# Patient Record
Sex: Male | Born: 1945 | Race: White | Hispanic: No | Marital: Married | Smoking: Former smoker
Health system: Southern US, Community
[De-identification: ages and names within clinical notes are randomized; demographics above are authoritative.]

## PROBLEM LIST (undated history)

## (undated) DIAGNOSIS — IMO0001 Reserved for inherently not codable concepts without codable children: Secondary | ICD-10-CM

## (undated) DIAGNOSIS — C4491 Basal cell carcinoma of skin, unspecified: Secondary | ICD-10-CM

## (undated) DIAGNOSIS — I2510 Atherosclerotic heart disease of native coronary artery without angina pectoris: Secondary | ICD-10-CM

## (undated) DIAGNOSIS — I82409 Acute embolism and thrombosis of unspecified deep veins of unspecified lower extremity: Secondary | ICD-10-CM

## (undated) DIAGNOSIS — I5189 Other ill-defined heart diseases: Secondary | ICD-10-CM

## (undated) DIAGNOSIS — Z9289 Personal history of other medical treatment: Secondary | ICD-10-CM

## (undated) DIAGNOSIS — E669 Obesity, unspecified: Secondary | ICD-10-CM

## (undated) DIAGNOSIS — T8859XA Other complications of anesthesia, initial encounter: Secondary | ICD-10-CM

## (undated) DIAGNOSIS — R001 Bradycardia, unspecified: Secondary | ICD-10-CM

## (undated) DIAGNOSIS — M1990 Unspecified osteoarthritis, unspecified site: Secondary | ICD-10-CM

## (undated) DIAGNOSIS — I2699 Other pulmonary embolism without acute cor pulmonale: Secondary | ICD-10-CM

## (undated) DIAGNOSIS — R55 Syncope and collapse: Secondary | ICD-10-CM

## (undated) DIAGNOSIS — Z8739 Personal history of other diseases of the musculoskeletal system and connective tissue: Secondary | ICD-10-CM

## (undated) DIAGNOSIS — N400 Enlarged prostate without lower urinary tract symptoms: Secondary | ICD-10-CM

## (undated) DIAGNOSIS — N529 Male erectile dysfunction, unspecified: Secondary | ICD-10-CM

## (undated) DIAGNOSIS — E7800 Pure hypercholesterolemia, unspecified: Secondary | ICD-10-CM

## (undated) HISTORY — PX: SKIN CANCER EXCISION: SHX779

## (undated) HISTORY — PX: LOOP RECORDER IMPLANT: SHX5954

## (undated) HISTORY — PX: JOINT REPLACEMENT: SHX530

## (undated) HISTORY — DX: Male erectile dysfunction, unspecified: N52.9

## (undated) HISTORY — DX: Reserved for inherently not codable concepts without codable children: IMO0001

## (undated) HISTORY — PX: MOHS SURGERY: SUR867

## (undated) HISTORY — DX: Other ill-defined heart diseases: I51.89

## (undated) HISTORY — DX: Obesity, unspecified: E66.9

## (undated) HISTORY — DX: Personal history of other medical treatment: Z92.89

## (undated) HISTORY — DX: Other pulmonary embolism without acute cor pulmonale: I26.99

## (undated) HISTORY — DX: Basal cell carcinoma of skin, unspecified: C44.91

## (undated) HISTORY — DX: Acute embolism and thrombosis of unspecified deep veins of unspecified lower extremity: I82.409

## (undated) HISTORY — DX: Syncope and collapse: R55

## (undated) HISTORY — DX: Pure hypercholesterolemia, unspecified: E78.00

## (undated) HISTORY — DX: Benign prostatic hyperplasia without lower urinary tract symptoms: N40.0

## (undated) HISTORY — DX: Bradycardia, unspecified: R00.1

---

## 1977-08-15 DIAGNOSIS — I82409 Acute embolism and thrombosis of unspecified deep veins of unspecified lower extremity: Secondary | ICD-10-CM

## 1977-08-15 DIAGNOSIS — I2699 Other pulmonary embolism without acute cor pulmonale: Secondary | ICD-10-CM

## 1977-08-15 HISTORY — DX: Acute embolism and thrombosis of unspecified deep veins of unspecified lower extremity: I82.409

## 1977-08-15 HISTORY — DX: Other pulmonary embolism without acute cor pulmonale: I26.99

## 1997-11-16 ENCOUNTER — Emergency Department (HOSPITAL_COMMUNITY): Admission: EM | Admit: 1997-11-16 | Discharge: 1997-11-16 | Payer: Self-pay | Admitting: Emergency Medicine

## 2004-12-03 ENCOUNTER — Observation Stay (HOSPITAL_COMMUNITY): Admission: EM | Admit: 2004-12-03 | Discharge: 2004-12-04 | Payer: Self-pay | Admitting: *Deleted

## 2007-01-23 ENCOUNTER — Ambulatory Visit: Payer: Self-pay | Admitting: Internal Medicine

## 2007-01-23 DIAGNOSIS — Z86718 Personal history of other venous thrombosis and embolism: Secondary | ICD-10-CM

## 2007-01-23 DIAGNOSIS — M79609 Pain in unspecified limb: Secondary | ICD-10-CM | POA: Insufficient documentation

## 2007-01-23 DIAGNOSIS — M549 Dorsalgia, unspecified: Secondary | ICD-10-CM | POA: Insufficient documentation

## 2007-01-23 DIAGNOSIS — Z85828 Personal history of other malignant neoplasm of skin: Secondary | ICD-10-CM

## 2007-01-24 ENCOUNTER — Encounter: Payer: Self-pay | Admitting: Internal Medicine

## 2007-01-30 ENCOUNTER — Encounter: Admission: RE | Admit: 2007-01-30 | Discharge: 2007-01-30 | Payer: Self-pay | Admitting: Internal Medicine

## 2008-03-28 ENCOUNTER — Emergency Department (HOSPITAL_BASED_OUTPATIENT_CLINIC_OR_DEPARTMENT_OTHER): Admission: EM | Admit: 2008-03-28 | Discharge: 2008-03-28 | Payer: Self-pay | Admitting: Emergency Medicine

## 2008-09-15 ENCOUNTER — Encounter: Admission: RE | Admit: 2008-09-15 | Discharge: 2008-09-15 | Payer: Self-pay | Admitting: General Surgery

## 2009-02-20 ENCOUNTER — Ambulatory Visit: Payer: Self-pay | Admitting: Family Medicine

## 2009-02-20 LAB — CONVERTED CEMR LAB
Basophils Absolute: 0 10*3/uL (ref 0.0–0.1)
Eosinophils Relative: 2.6 % (ref 0.0–5.0)
HCT: 42.8 % (ref 39.0–52.0)
Hemoglobin: 15.3 g/dL (ref 13.0–17.0)
Lymphs Abs: 1.6 10*3/uL (ref 0.7–4.0)
Monocytes Relative: 12.2 % — ABNORMAL HIGH (ref 3.0–12.0)
Neutro Abs: 4.7 10*3/uL (ref 1.4–7.7)
Neutrophils Relative %: 63 % (ref 43.0–77.0)
Platelets: 154 10*3/uL (ref 150.0–400.0)
RBC: 4.81 M/uL (ref 4.22–5.81)

## 2009-12-13 DIAGNOSIS — IMO0001 Reserved for inherently not codable concepts without codable children: Secondary | ICD-10-CM

## 2009-12-13 HISTORY — PX: CARDIOVASCULAR STRESS TEST: SHX262

## 2009-12-13 HISTORY — DX: Reserved for inherently not codable concepts without codable children: IMO0001

## 2010-01-12 ENCOUNTER — Encounter: Payer: Self-pay | Admitting: Internal Medicine

## 2010-01-13 ENCOUNTER — Encounter (INDEPENDENT_AMBULATORY_CARE_PROVIDER_SITE_OTHER): Payer: Self-pay | Admitting: Specialist

## 2010-01-13 ENCOUNTER — Ambulatory Visit: Payer: Self-pay | Admitting: Vascular Surgery

## 2010-01-13 ENCOUNTER — Ambulatory Visit (HOSPITAL_COMMUNITY): Admission: RE | Admit: 2010-01-13 | Discharge: 2010-01-13 | Payer: Self-pay | Admitting: Specialist

## 2010-01-19 ENCOUNTER — Encounter: Payer: Self-pay | Admitting: Cardiology

## 2010-03-04 ENCOUNTER — Ambulatory Visit: Payer: Self-pay | Admitting: Vascular Surgery

## 2010-06-25 ENCOUNTER — Ambulatory Visit: Payer: Self-pay | Admitting: Cardiology

## 2010-09-14 NOTE — Letter (Signed)
Summary: [facility]  [facility]   Imported By: Maryln Gottron 01/27/2010 15:27:22  _____________________________________________________________________  External Attachment:    Type:   Image     Comment:   External Document

## 2010-09-14 NOTE — Letter (Signed)
Summary: [facility] Office Visit Note   [facility] Office Visit Note   Imported By: Roderic Ovens 03/03/2010 11:57:40  _____________________________________________________________________  External Attachment:    Type:   Image     Comment:   External Document

## 2010-12-28 NOTE — Consult Note (Signed)
VASCULAR SURGERY CONSULTATION   Joe Davis, Joe Davis  DOB:  [birth year]                                       03/04/2010  VWUJW#:[id number]   I saw the patient in the office today in consultation concerning his  right leg swelling.  He was referred by Dr. Shelle Iron.  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  He states he was on  Coumadin for a couple of years but then this was discontinued.  He has  had no new clots since that time that he is aware of.  He had been  having increasing problems with swelling in his right leg and was  evaluated by Dr. Shelle Iron.  He did undergo venous duplex scan on 01/13/2010  which showed no evidence of DVT.  The swelling had come on gradually and  was exacerbated by standing and relieved with elevation.  There were no  other aggravating or alleviating factors.  He had really no significant  pain associated with the swelling.  He denies any history of  claudication, rest pain or nonhealing ulcers.   His past medical history is fairly unremarkable otherwise.  He denies  any history of diabetes, hypertension, hypercholesterolemia, history of  previous myocardial infarction, history of congestive heart failure or  history of COPD.   His family history is noncontributory.  He is adopted and does not know  his family history.   SOCIAL HISTORY:  He is single.  He quit tobacco in 1979.  He drinks a  moderate amount of alcohol.   REVIEW OF SYSTEMS:  GENERAL:  He has had no recent weight loss, weight  gain or problems with his appetite.  CARDIOVASCULAR:  He has had no chest pain, chest pressure, palpitations  or arrhythmias.  He has had no history of stroke, TIAs or amaurosis  fugax.  He has had no claudication or rest pain.  GI, neurologic, pulmonary, hematologic, urinary, ENT, musculoskeletal,  psychiatric and integumentary review of systems is  unremarkable and is  documented on the medical history form in his chart.   PHYSICAL EXAMINATION:  General:  This is a pleasant 66 year old  gentleman who appears his stated age.  Vital signs:  Blood pressure is  118/73, heart rate is 61, saturation 98%.  HEENT:  Unremarkable.  Lungs:  Are clear bilaterally to auscultation without rales, rhonchi or  wheezing.  Cardiovascular:  I do not detect any carotid bruits.  He has  a regular rate and rhythm.  He has palpable femoral, popliteal and pedal  pulses bilaterally.  He has moderate right lower extremity swelling with  minimal left lower extremity swelling.  Abdomen:  Soft and nontender  with normal pitched bowel sounds.  No masses appreciated.  Musculoskeletal:  He has no major deformities or cyanosis.  Extremities:  He does have some telangiectasias and spider veins bilaterally.  Neurological:  He has no focal weakness or paresthesias.  Lymphatic:  He  has no significant cervical, axillary or inguinal lymphadenopathy.   I have reviewed his venous duplex scan from 01/13/2010.  This  demonstrated compressible vessels throughout from the right common  femoral vein down into the tibial veins.  Venous flow is noted to be  phasic and spontaneous.  There was no evidence  of chronic DVT present.  The patient has also undergone a workup for some back pain.  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 Shelle Iron with this also.   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  There is no real history to suggest  any reason for him to have significant lymphedema.  We have discussed  the importance of intermittent leg elevation and the proper positioning  for this.  He does have a compression stocking with a gradient  of 20 -  30 mmHg which appears to be an appropriate strength for him.  He has  been wearing this and this has helped his swelling significantly.  I  have explained that this typically is a chronic problem and that he will  need to continue use elevation and compression therapy to manage his  swelling.  Will be happy to see him back at any time if any new vascular  issues arise.     Di Kindle. Edilia Bo, M.D.  Electronically Signed  CSD/MEDQ  D:  03/04/2010  T:  03/05/2010  Job:  [id number]   cc:   Jene Every, M.D.

## 2010-12-31 NOTE — H&P (Signed)
NAME:  Joe Davis, Joe Davis NO.:  [id number]   MEDICAL RECORD NO.:  [id number]          PATIENT TYPE:  INP   LOCATION:  1827                         FACILITY:  [facility]   PHYSICIAN:  Vesta Mixer, M.D. DATE OF BIRTH:  [birth year]   DATE OF ADMISSION:  12/03/2004  DATE OF DISCHARGE:                                HISTORY & PHYSICAL   HISTORY OF PRESENT ILLNESS:  Joe Davis is a 65 year old gentleman with a  history of hyperlipidemia and basal cell carcinoma.  He is admitted for 23-  hour observation after having an episode of bradycardia and near syncope.   The patient has had a chronically low blood pressure for years.  It has  typically been in the 40 to 50 range according to the office notes.  He has  been completely asymptomatic.  He had a stress Cardiolite study  approximately 5 years ago which was negative for ischemia.   Yesterday, he had surgery at [facility] to remove a basal cell carcinoma  from the right side of his nose.  This morning, his wife was changing his  dressing.  He became very anxious and nervous.  He developed cold sweats and  lightheadedness.  EMS was called and they found him to be quite bradycardic.  On his way to the emergency room, he felt a lot better.  His heart rate came  up to the 50 range without any intervention.  He feels a little bit better  now, but is still weak and somewhat anxious about going home.   CURRENT MEDICATIONS:  1.  Lipitor 20 mg a day.  2.  Tylenol No.3 as needed.  3.  Keflex 500 mg twice a day.  4.  Enteric-coated aspirin 81 mg a day.   ALLERGIES:  No known drug allergies.   PAST MEDICAL HISTORY:  1.  Hyperlipidemia.  2.  Basal cell carcinoma.   SOCIAL HISTORY:  The patient does not smoke.  He drinks regularly.  His  family history is noncontributory.   REVIEW OF SYSTEMS:  Reviewed and is essentially negative.   PHYSICAL EXAMINATION:  GENERAL:  He is a middle-aged gentleman.  He is  comfortable, but  does appear anxious.  VITAL SIGNS:  Heart rate is 55, blood pressure 130/80.  HEENT:  Bandage on the right side of his nose.  Has no JVD.  LUNGS:  Clear.  HEART:  Regular rate and rhythm, S1 and S2, and somewhat bradycardic.  ABDOMEN:  Good bowel sounds and is nontender.  EXTREMITIES:  He has no cyanosis, clubbing, or edema.  NEUROLOGY:  Nonfocal.   Telemetry; strips from EMS reveal sinus bradycardia.  His EKG reveals sinus  bradycardia.   LABORATORY DATA:  His electrolytes are within normal limits.  His CPK-MB is  negative.   IMPRESSION:  1.  Bradycardia.  The most likely cause of this is a vagal reaction from the      dressing change.  His heart rate is always slow and this is not unusual      for someone to have a vagal reaction with a dressing  change such as      this.  He is completely asymptomatic and I do not think that this is an      acute cardiac emergency.  He probably will need a pacemaker some day,      but I do not think that he will necessarily need it soon.  I would like      to admit him for 23 hours for observation to make sure that this does      not happen again.  We will anticipate discharge tomorrow.  2.  All other medical problems remain stable.      PJN/MEDQ  D:  12/03/2004  T:  12/03/2004  Job:  [id number]   cc:   Cassell Clement, M.D.  [address], [address]  [place]  [place] [address]  Fax: [phone]

## 2011-01-07 ENCOUNTER — Telehealth: Payer: Self-pay | Admitting: Cardiology

## 2011-01-07 NOTE — Telephone Encounter (Signed)
Left message for patient to call back to schedule.  °

## 2011-01-07 NOTE — Telephone Encounter (Signed)
Patient needs June appt.  Can't schedule, no openings.  Please call

## 2011-01-24 ENCOUNTER — Encounter: Payer: Self-pay | Admitting: Cardiology

## 2011-01-26 ENCOUNTER — Encounter: Payer: Self-pay | Admitting: Cardiology

## 2011-01-26 ENCOUNTER — Ambulatory Visit (INDEPENDENT_AMBULATORY_CARE_PROVIDER_SITE_OTHER): Payer: Medicare Other | Admitting: Cardiology

## 2011-01-26 VITALS — BP 124/70 | HR 64 | Wt 231.0 lb

## 2011-01-26 DIAGNOSIS — E7800 Pure hypercholesterolemia, unspecified: Secondary | ICD-10-CM

## 2011-01-26 DIAGNOSIS — M549 Dorsalgia, unspecified: Secondary | ICD-10-CM

## 2011-01-26 DIAGNOSIS — E785 Hyperlipidemia, unspecified: Secondary | ICD-10-CM

## 2011-01-26 NOTE — Progress Notes (Signed)
Joe Davis Date of Birth:  [birth year] [facility] [address]   [address] [place], [place]  [address] [phone]           Fax   [phone]  HPI: This pleasant 65 year old gentleman is seen for a six-month followup office visit.  He has a history of hypercholesterolemia.  He does not have any history of ischemic heart disease.  The patient had a LexiScan Cardiolite stress test on 01/04/10 which showed an ejection fraction of 73% and no evidence of myocardial patient has a past history of mild cardiomegaly and a past history of deep vein thrombosis.  He had an echocardiogram on 01/28/8 which showed normal left ventricular systolic function and did show diastolic dysfunction as well as mild aortic sclerosis and mild pulmonary hypertension at 40.  The patient has not been expressing any chest pain.  Current Outpatient Prescriptions  Medication Sig Dispense Refill  . Ascorbic Acid (VITAMIN C PO) Take by mouth daily.        Marland Kitchen aspirin 81 MG tablet Take 81 mg by mouth daily. Taking occ.      . fish oil-omega-3 fatty acids 1000 MG capsule Take by mouth daily.        Marland Kitchen NIACIN PO Take by mouth daily.          Allergies  Allergen Reactions  . Crestor (Rosuvastatin Calcium)     LEG PAIN  . Lipitor (Atorvastatin Calcium)     LEG PAIN    Patient Active Problem List  Diagnoses  . BACK PAIN  . Pain in Soft Tissues of Limb  . SKIN CANCER, HX OF  . Personal History of Venous Thrombosis and Embolism  . Hypercholesterolemia    History  Smoking status  . Former Smoker  . Quit date: 08/15/1977  Smokeless tobacco  . Not on file    History  Alcohol Use No    No family history on file.  Review of Systems: The patient denies any heat or cold intolerance.  No weight gain or weight loss.  The patient denies headaches or blurry vision.  There is no cough or sputum production.  The patient denies dizziness.  There is no hematuria or hematochezia.    The  patient denies any rash.  The patient denies frequent falling or instability.  There is no history of depression or anxiety.  All other systems were reviewed and are negative.   Physical Exam: Filed Vitals:   01/26/11 1059  BP: 124/70  Pulse: 64  The general appearance feels a well-developed well-nourished gentleman in no distress.The head and neck exam reveals pupils equal and reactive.  Extraocular movements are full.  There is no scleral icterus.  The mouth and pharynx are normal.  The neck is supple.  The carotids reveal no bruits.  The jugular venous pressure is normal.  The  thyroid is not enlarged.  There is no lymphadenopathy.  The chest is clear to percussion and auscultation.  There are no rales or rhonchi.  Expansion of the chest is symmetrical.  The precordium is quiet.  The first heart sound is normal.  The second heart sound is physiologically split.  There is no murmur gallop rub or click.  There is no abnormal lift or heave.  The abdomen is soft and nontender.  The bowel sounds are normal.  The liver and spleen are not enlarged.  There are no abdominal masses.  There are no abdominal bruits.  Extremities reveal  good pedal pulses.  There is no phlebitis or edema.  There is no cyanosis or clubbing.  Strength is normal and symmetrical in all extremities.  There is no lateralizing weakness.  There are no sensory deficits.  The skin is warm and dry.  There is no rash.    Assessment / Plan:  The patient is to return later this week for fasting lab work he will contact his neurosurgeon friend at [facility] regarding his back pain with sciatic radiation of pain into his right leg.  Recheck in 6 months for followup office visit and fasting lab

## 2011-01-26 NOTE — Assessment & Plan Note (Signed)
The patient is seen after a long absence.  He has a past history of hypercholesterolemia.  He is not presently taking any statin therapy.  In the past Lipitor and Crestor caused worsening of his chronic leg pain.  The patient has not been expressing any chest pain or increased shortness of breath or symptoms of angina pectoris.  The patient did not come fasting today so we will have him return on June 15 fasting for lipid panel hepatic function panel and basal metabolic panel.

## 2011-01-26 NOTE — Assessment & Plan Note (Signed)
The patient is still having a lot of problems with pain in his leg.  His orthopedist gave him an Injection in the right hip which did not help.  The patient had an MRI of his back about 4 years ago which did demonstrate some bulging discs.  The patient has a friend who is a Midwife at [facility] whom he will consult regarding his back symptoms.

## 2011-01-28 ENCOUNTER — Other Ambulatory Visit: Payer: Self-pay | Admitting: Cardiology

## 2011-01-28 ENCOUNTER — Other Ambulatory Visit (INDEPENDENT_AMBULATORY_CARE_PROVIDER_SITE_OTHER): Payer: Medicare Other | Admitting: *Deleted

## 2011-01-28 DIAGNOSIS — E785 Hyperlipidemia, unspecified: Secondary | ICD-10-CM

## 2011-01-28 LAB — HEPATIC FUNCTION PANEL
ALT: 30 U/L (ref 0–53)
AST: 33 U/L (ref 0–37)
Albumin: 4.1 g/dL (ref 3.5–5.2)
Alkaline Phosphatase: 56 U/L (ref 39–117)
Total Protein: 6.8 g/dL (ref 6.0–8.3)

## 2011-01-28 LAB — BASIC METABOLIC PANEL
GFR: 119.52 mL/min (ref >60.00)
Potassium: 4.3 mEq/L (ref 3.5–5.1)
Sodium: 140 mEq/L (ref 135–145)

## 2011-01-31 ENCOUNTER — Telehealth: Payer: Self-pay | Admitting: *Deleted

## 2011-01-31 NOTE — Telephone Encounter (Signed)
Mailed copy of labs 

## 2011-02-24 ENCOUNTER — Telehealth: Payer: Self-pay | Admitting: Cardiology

## 2011-02-24 DIAGNOSIS — M109 Gout, unspecified: Secondary | ICD-10-CM

## 2011-02-24 MED ORDER — ALLOPURINOL 300 MG PO TABS: 300.0000 mg | ORAL_TABLET | Freq: Every day | ORAL | Status: DC

## 2011-02-24 NOTE — Telephone Encounter (Signed)
Please advise 

## 2011-02-24 NOTE — Telephone Encounter (Signed)
Renal function normal, ordered allopurinol to return for lab work in 2-3wks, pt aware.Alfonso Ramus RN

## 2011-02-24 NOTE — Telephone Encounter (Signed)
Patient would like a prescription called into [facility] for his gout. Pls.call him back if you need to talk to him.

## 2011-02-25 ENCOUNTER — Telehealth: Payer: Self-pay | Admitting: Cardiology

## 2011-02-25 NOTE — Telephone Encounter (Signed)
Called patient back and he thinks he needs a combination of medications.  Please advise

## 2011-02-25 NOTE — Telephone Encounter (Signed)
Called because his pharmacist said that he is one prescription short of controlling acute gout. Needed a prescription filled for allopurinol 300mg  filled at [facility] in [place] [phone]. Please call back. I have pulled his chart.

## 2011-02-28 NOTE — Telephone Encounter (Signed)
Take allopurinol every day.  We will add Colcrys 0.6 mg take 2 pillsat onset of acute attack and one pill one hour later.  No more than 3 pills in 24 hours.  #15

## 2011-02-28 NOTE — Telephone Encounter (Signed)
Patient stated he went to [facility] and was given a prescription for this already.

## 2011-03-17 ENCOUNTER — Other Ambulatory Visit (INDEPENDENT_AMBULATORY_CARE_PROVIDER_SITE_OTHER): Payer: Medicare Other | Admitting: *Deleted

## 2011-03-17 DIAGNOSIS — M109 Gout, unspecified: Secondary | ICD-10-CM

## 2011-03-17 LAB — HEPATIC FUNCTION PANEL
ALT: 24 U/L (ref 0–53)
AST: 30 U/L (ref 0–37)
Albumin: 4.2 g/dL (ref 3.5–5.2)
Alkaline Phosphatase: 53 U/L (ref 39–117)
Bilirubin, Direct: 0 mg/dL (ref 0.0–0.3)
Total Bilirubin: 0.8 mg/dL (ref 0.3–1.2)
Total Protein: 6.7 g/dL (ref 6.0–8.3)

## 2011-03-17 LAB — BASIC METABOLIC PANEL: GFR: 88.26 mL/min (ref >60.00)

## 2011-03-18 ENCOUNTER — Telehealth: Payer: Self-pay | Admitting: *Deleted

## 2011-03-18 NOTE — Telephone Encounter (Signed)
Advised of labs 

## 2011-03-25 ENCOUNTER — Telehealth: Payer: Self-pay | Admitting: *Deleted

## 2011-03-25 NOTE — Telephone Encounter (Signed)
FYI

## 2011-03-25 NOTE — Telephone Encounter (Signed)
Transfer of care letter request from Dr Cato Mulligan to Dr Caryl Never

## 2011-03-25 NOTE — Telephone Encounter (Signed)
ok 

## 2011-04-12 ENCOUNTER — Telehealth: Payer: Self-pay | Admitting: Cardiology

## 2011-04-12 NOTE — Telephone Encounter (Signed)
Forwarded to [employer]

## 2011-04-12 NOTE — Telephone Encounter (Signed)
Pt calling to see if had a MRI of back in his chart done in 2008.  Pt would like to have another MRI of the back.  Please call pt back to discuss.  Chart in box.

## 2011-05-02 ENCOUNTER — Telehealth: Payer: Self-pay | Admitting: Cardiology

## 2011-05-02 NOTE — Telephone Encounter (Signed)
PT CALLING HAVING IRREGULAR HEARTBEATS, FOR LAST TWO DAYS, WANTS APPT THIS WEEK, WILL COME AND SIT IF HE HAS TO

## 2011-05-02 NOTE — Telephone Encounter (Signed)
Scheduled appointment with Lawson Fiscal NP tomorrow

## 2011-05-03 ENCOUNTER — Encounter: Payer: Self-pay | Admitting: Nurse Practitioner

## 2011-05-03 ENCOUNTER — Ambulatory Visit (INDEPENDENT_AMBULATORY_CARE_PROVIDER_SITE_OTHER): Payer: Medicare Other | Admitting: Nurse Practitioner

## 2011-05-03 DIAGNOSIS — E7800 Pure hypercholesterolemia, unspecified: Secondary | ICD-10-CM

## 2011-05-03 DIAGNOSIS — R0789 Other chest pain: Secondary | ICD-10-CM | POA: Insufficient documentation

## 2011-05-03 DIAGNOSIS — R002 Palpitations: Secondary | ICD-10-CM | POA: Insufficient documentation

## 2011-05-03 NOTE — Assessment & Plan Note (Signed)
His symptoms do not seem to be cardiac given their nature of "jumping around" in his chest. He has had a negative stress test last May. He has no exertional symptoms and has good exercise tolerance. He will continue to monitor and will be in touch with [place] if he has any worsening or change in symptoms.

## 2011-05-03 NOTE — Patient Instructions (Signed)
We will place a monitor to look at your heart rhythm for the next 24 hours.  Reduction of caffeine and alcohol are encouraged to help with the "skips" Regular exercise, diet and weight loss are recommended

## 2011-05-03 NOTE — Progress Notes (Signed)
    Joe Davis Date of Birth: 29-Oct-1945   History of Present Illness: Joe Davis is seen today for a work in visit. He is seen for Dr. Patty Sermons. He is here because he states that his significant other detected an irregular pulse. He is asymptomatic. It happens in the evening. Last night seemed to be more pronounced. He has daily alcohol use and says he may have had too much to drink last night. He is not dizzy or lightheaded. He does use caffeine in the form of coffee and soda. He will have some atypical chest pain that move around the left and right side of his chest. Nothing exertional. He is swimming. He has no symptoms while swimming. He is trying to lose weight. He brings in labs from [facility] for review. Total cholesterol was 200 with LDL of 137. He says he cannot take statins but has been given some other type of medicine from [facility]. He does not know the name of the medicine, nor has he been taking it. He continues with chronic leg/back issues.   Current Outpatient Prescriptions on File Prior to Visit  Medication Sig Dispense Refill  . allopurinol (ZYLOPRIM) 300 MG tablet Take 1 tablet (300 mg total) by mouth daily.  30 tablet  0  . aspirin 81 MG tablet Take 81 mg by mouth daily. Taking occ.      . fish oil-omega-3 fatty acids 1000 MG capsule Take by mouth daily.          Allergies  Allergen Reactions  . Crestor (Rosuvastatin Calcium)     LEG PAIN  . Lipitor (Atorvastatin Calcium)     LEG PAIN    Past Medical History  Diagnosis Date  . Hypercholesterolemia   . DVT (deep venous thrombosis) 1979  . Pulmonary embolism   . Basal cell carcinoma   . Syncope and collapse   . Bradycardia     asymptomatic  . Elevated CK 2011  . BPH (benign prostatic hyperplasia)   . Normal nuclear stress test May 2011  . Erectile dysfunction     Past Surgical History  Procedure Date  . Cardiovascular stress test     EF 73%    History  Smoking status  . Former Smoker  . Quit date:  08/15/1977  Smokeless tobacco  . Not on file    History  Alcohol Use  . Yes    daily use    No family history on file.  Review of Systems: The review of systems is positive for chronic leg/back issues. No syncope. No chest pain with exertion. He is trying to lose weight and has lost 11 pounds since his last visit here in June.  All other systems were reviewed and are negative.  Physical Exam: BP 130/84  Pulse 51  Resp 20  Ht 5\' 10"  (1.778 m)  Wt 220 lb (99.791 kg)  BMI 31.57 kg/m2 Patient is somewhat anxious but in no acute distress. Skin is warm and dry. Color is normal.  HEENT is unremarkable. Normocephalic/atraumatic. PERRL. Sclera are nonicteric. Neck is supple. No masses. No JVD. Lungs are clear. Cardiac exam shows a regular rate and rhythm.No ectopics noted. Abdomen is obese but soft. Extremities are without edema. Gait and ROM are intact. No gross neurologic deficits noted.   LABORATORY DATA: EKG shows sinus bradycardia. It is normal.   Assessment / Plan:

## 2011-05-03 NOTE — Assessment & Plan Note (Signed)
He is really asymptomatic but would like to have this further defined. 24 hour holter will be placed when he returns from swimming later this morning. I have encouraged him to cut back on caffeine and alcohol but I did not get the feeling that he is motivated to make those changes. Patient is agreeable to this plan and will call if any problems develop in the interim.

## 2011-05-03 NOTE — Assessment & Plan Note (Signed)
Currently it is not clear what therapy he is taking. He will call back with the medicines prescribed at [facility] He is willing to take for the next 6 weeks and then recheck his labs. Risk factor modification was strongly encouraged, especially if he is deemed "statin intolerant".

## 2011-05-05 ENCOUNTER — Telehealth: Payer: Self-pay | Admitting: Internal Medicine

## 2011-05-05 NOTE — Telephone Encounter (Signed)
Pt had sent a formal letter req to switch pcp from Dr Cato Mulligan to Dr Caryl Never. Pt has not gotten a response and is faxing the letter again. Pt is having severe epigastric pain and would like to make ov to est with Dr Caryl Never asap. Pls advise.

## 2011-05-05 NOTE — Telephone Encounter (Signed)
Ok with me 

## 2011-05-06 NOTE — Telephone Encounter (Signed)
Called and lft vm for pt that both doctors have agreed to change of pcp and to cb an sch ov with Dr Caryl Never.

## 2011-05-06 NOTE — Telephone Encounter (Signed)
ok 

## 2011-05-11 ENCOUNTER — Telehealth: Payer: Self-pay | Admitting: *Deleted

## 2011-05-11 NOTE — Telephone Encounter (Signed)
Advised of 24 hour holter monitor results, decrease caffeine and alcohol intake.

## 2011-05-13 LAB — URINALYSIS, ROUTINE W REFLEX MICROSCOPIC
Bilirubin Urine: NEGATIVE
Ketones, ur: NEGATIVE
pH: 5

## 2011-05-13 LAB — DIFFERENTIAL
Basophils Relative: 1
Eosinophils Absolute: 0.2
Eosinophils Relative: 4
Lymphs Abs: 2.2
Monocytes Absolute: 0.9
Neutrophils Relative %: 47

## 2011-05-13 LAB — CBC
MCHC: 34.5
MCV: 88.4
RBC: 5.11
WBC: 6.3

## 2011-05-13 LAB — COMPREHENSIVE METABOLIC PANEL
ALT: 28
AST: 36
BUN: 17
Calcium: 9.7
Chloride: 103
Potassium: 4.1
Sodium: 140
Total Protein: 7.4

## 2011-05-13 LAB — URINE CULTURE: Colony Count: >=100000

## 2011-05-13 LAB — URINE MICROSCOPIC-ADD ON

## 2011-05-13 LAB — TSH: TSH: 1.388

## 2011-06-01 ENCOUNTER — Encounter: Payer: Self-pay | Admitting: Nurse Practitioner

## 2011-06-01 ENCOUNTER — Ambulatory Visit (INDEPENDENT_AMBULATORY_CARE_PROVIDER_SITE_OTHER): Payer: Medicare Other | Admitting: Nurse Practitioner

## 2011-06-01 VITALS — BP 136/90 | HR 54 | Ht 70.0 in | Wt 223.0 lb

## 2011-06-01 DIAGNOSIS — R002 Palpitations: Secondary | ICD-10-CM

## 2011-06-01 DIAGNOSIS — E7800 Pure hypercholesterolemia, unspecified: Secondary | ICD-10-CM

## 2011-06-01 DIAGNOSIS — E785 Hyperlipidemia, unspecified: Secondary | ICD-10-CM

## 2011-06-01 LAB — BASIC METABOLIC PANEL
BUN: 18 mg/dL (ref 6–23)
CO2: 23 mEq/L (ref 19–32)
Calcium: 8.6 mg/dL (ref 8.4–10.5)
Chloride: 107 mEq/L (ref 96–112)
Creatinine, Ser: 0.8 mg/dL (ref 0.4–1.5)
GFR: 103.84 mL/min (ref >60.00)
Glucose, Bld: 99 mg/dL (ref 70–99)
Potassium: 4.2 mEq/L (ref 3.5–5.1)
Sodium: 138 mEq/L (ref 135–145)

## 2011-06-01 LAB — HEPATIC FUNCTION PANEL
ALT: 26 U/L (ref 0–53)
AST: 29 U/L (ref 0–37)
Albumin: 4 g/dL (ref 3.5–5.2)
Alkaline Phosphatase: 57 U/L (ref 39–117)
Bilirubin, Direct: 0.1 mg/dL (ref 0.0–0.3)
Total Bilirubin: 0.9 mg/dL (ref 0.3–1.2)
Total Protein: 6.8 g/dL (ref 6.0–8.3)

## 2011-06-01 LAB — LIPID PANEL
Cholesterol: 194 mg/dL (ref 0–200)
HDL: 45.6 mg/dL (ref >39.00)
LDL Cholesterol: 131 mg/dL — ABNORMAL HIGH (ref 0–99)
Total CHOL/HDL Ratio: 4
Triglycerides: 87 mg/dL (ref 0.0–149.0)
VLDL: 17.4 mg/dL (ref 0.0–40.0)

## 2011-06-01 NOTE — Assessment & Plan Note (Addendum)
No further complaints. Holter was ok. He is encouraged to cut back on caffeine and alcohol. Labs will be rechecked today. He would like to be seen back by me in 6 months. Patient is agreeable to this plan and will call if any problems develop in the interim.

## 2011-06-01 NOTE — Patient Instructions (Signed)
We will recheck your labs today and see what your cholesterol levels look like  I will see you in 6 months.  Call for any problems.  Continue to stay active, work on your weight and diet.

## 2011-06-01 NOTE — Assessment & Plan Note (Addendum)
He is on an OTC supplement. Not taking regularly. Will recheck labs today. Will recheck in 6 months as well. Diet/exercise/weight loss is once again encouraged.

## 2011-06-01 NOTE — Progress Notes (Signed)
    [name] Date of Birth: 12-28-1945 Medical Record #[id number]  History of Present Illness: Mr. Primo is seen back today for a 6 week visit. He is seen for Dr. Patty Sermons. He has had no more complaints of irregular heart beat. No chest pain. No shortness of breath. His holter monitor was ok. He was encouraged to cut back on his caffeine and alcohol consumption. He is taking an OTC supplement for his cholesterol. He admits that he is not taking it regularly. He may be interested in very low dose statin therapy just a couple of times a week. Blood pressure has been ok at home. He continues to swim. Not really losing weight.   Current Outpatient Prescriptions on File Prior to Visit  Medication Sig Dispense Refill  . aspirin 81 MG tablet Take 81 mg by mouth daily. Taking occ.      . fish oil-omega-3 fatty acids 1000 MG capsule Take by mouth daily.        Marland Kitchen DISCONTD: allopurinol (ZYLOPRIM) 300 MG tablet Take 1 tablet (300 mg total) by mouth daily.  30 tablet  0    Allergies  Allergen Reactions  . Crestor (Rosuvastatin Calcium)     LEG PAIN  . Lipitor (Atorvastatin Calcium)     LEG PAIN    Past Medical History  Diagnosis Date  . Hypercholesterolemia   . DVT (deep venous thrombosis) 1979  . Pulmonary embolism   . Basal cell carcinoma   . Syncope and collapse   . Bradycardia     asymptomatic  . Elevated CK 2011  . BPH (benign prostatic hyperplasia)   . Normal nuclear stress test May 2011  . Erectile dysfunction   . Diastolic dysfunction     Per echo in 2008    Past Surgical History  Procedure Date  . Cardiovascular stress test May 2011    EF 73%; No ischemia    History  Smoking status  . Former Smoker  . Quit date: 08/15/1977  Smokeless tobacco  . Not on file    History  Alcohol Use  . Yes    daily use    No family history on file.  Review of Systems: The review of systems is per the HPI.  All other systems were reviewed and are negative.  Physical  Exam: BP 136/90  Pulse 54  Ht 5\' 10"  (1.778 m)  Wt 223 lb (101.152 kg)  BMI 32.00 kg/m2 Patient is in no acute distress. Skin is warm and dry. Color is normal.  HEENT is unremarkable. Normocephalic/atraumatic. PERRL. Sclera are nonicteric. Neck is supple. No masses. No JVD. Lungs are clear. Cardiac exam shows a regular rate and rhythm. Abdomen is soft. Extremities are without edema. Gait and ROM are intact. No gross neurologic deficits noted.   LABORATORY DATA: Labs are pending.   Assessment / Plan:

## 2011-06-02 NOTE — Progress Notes (Signed)
Left message to call back monday

## 2011-06-10 ENCOUNTER — Telehealth: Payer: Self-pay | Admitting: Cardiology

## 2011-06-10 NOTE — Telephone Encounter (Signed)
lm

## 2011-06-10 NOTE — Telephone Encounter (Signed)
Mr. Karge called requesting results. Left him a message to call us back since Lawson Fiscal wants to start on statin

## 2011-06-10 NOTE — Telephone Encounter (Signed)
Please call about test results and can leave message

## 2011-06-12 NOTE — Progress Notes (Signed)
Agree with plan 

## 2011-06-17 NOTE — Telephone Encounter (Signed)
lm

## 2011-06-22 NOTE — Telephone Encounter (Signed)
Synetta Fail mailed copy of labs

## 2011-08-04 ENCOUNTER — Telehealth: Payer: Self-pay | Admitting: Cardiology

## 2011-08-04 NOTE — Telephone Encounter (Signed)
Pt wants some tamaflu called in cause he has a cold call [facility] [place]

## 2011-08-04 NOTE — Telephone Encounter (Signed)
Ok to call in tamiflu 75 BID #10

## 2011-08-04 NOTE — Telephone Encounter (Signed)
Pt c/o of cold symptoms for 3-4 days.  Requesting RX for Tamiflu.  Advised pt that [facility] and/or Dr. Patty Sermons out of office until 08/05/2011.

## 2011-08-05 NOTE — Telephone Encounter (Signed)
Left message

## 2011-08-05 NOTE — Telephone Encounter (Signed)
No answer, will try again.  Did discuss with  Dr. Patty Sermons and Tamiflu should be started within 48 hours

## 2011-08-11 NOTE — Telephone Encounter (Signed)
Patient never called back.  When left message it did identify him

## 2011-08-16 HISTORY — PX: COLONOSCOPY: SHX174

## 2011-08-22 ENCOUNTER — Encounter: Payer: Self-pay | Admitting: Cardiology

## 2011-08-29 ENCOUNTER — Encounter: Payer: Self-pay | Admitting: Family Medicine

## 2011-08-29 ENCOUNTER — Ambulatory Visit (INDEPENDENT_AMBULATORY_CARE_PROVIDER_SITE_OTHER): Payer: Medicare Other | Admitting: Family Medicine

## 2011-08-29 VITALS — BP 120/80 | HR 60 | Temp 98.6°F | Wt 224.0 lb

## 2011-08-29 DIAGNOSIS — R1011 Right upper quadrant pain: Secondary | ICD-10-CM

## 2011-08-29 DIAGNOSIS — R109 Unspecified abdominal pain: Secondary | ICD-10-CM

## 2011-08-29 NOTE — Progress Notes (Signed)
  Subjective:    Patient ID: Joe Davis, male    DOB: 11-29-1945, 66 y.o.   MRN: [id number]  HPI  Patient seen with 2-3 week history of upper abdominal pain which is diffuse and bilateral and fleeting. Pain usually only last a few seconds. He has some sharp pains underneath rib cage right and left side. This occurs mostly with bending and twisting activities. No symptoms at rest. Denies any chest pains or dyspnea. No cough. No skin rash. Denies fever or chills. No soreness to touch. No alleviating factors.  Noted with swimming but actually improved the longer he swims. No abnormal pain. No change in stool habits.  No pleuritic pain and no dyspnea.  Past Medical History  Diagnosis Date  . Hypercholesterolemia     ? statin intolerant.   Marland Kitchen DVT (deep venous thrombosis) 1979  . Pulmonary embolism   . Basal cell carcinoma   . Syncope and collapse   . Bradycardia     asymptomatic  . Elevated CK 2011  . BPH (benign prostatic hyperplasia)   . Normal nuclear stress test May 2011  . Erectile dysfunction   . Diastolic dysfunction     Per echo in 2008   Past Surgical History  Procedure Date  . Cardiovascular stress test May 2011    EF 73%; No ischemia    reports that he quit smoking about 34 years ago. He does not have any smokeless tobacco history on file. He reports that he drinks alcohol. He reports that he does not use illicit drugs. family history is not on file. Allergies  Allergen Reactions  . Crestor (Rosuvastatin Calcium)     LEG PAIN  . Lipitor (Atorvastatin Calcium)     LEG PAIN      Review of Systems  Constitutional: Negative for fever, chills, activity change, appetite change and unexpected weight change.  Respiratory: Negative for cough and shortness of breath.   Cardiovascular: Negative for chest pain, palpitations and leg swelling.  Gastrointestinal: Negative for nausea, vomiting, diarrhea, constipation and blood in stool.  Genitourinary: Negative for dysuria.    Musculoskeletal: Negative for back pain.  Skin: Negative for rash.  Hematological: Negative for adenopathy. Does not bruise/bleed easily.       Objective:   Physical Exam  Constitutional: He appears well-developed and well-nourished. No distress.  Neck: Neck supple. No thyromegaly present.  Cardiovascular: Normal rate, regular rhythm and normal heart sounds.   Pulmonary/Chest: Effort normal and breath sounds normal. No respiratory distress. He has no wheezes. He has no rales.  Abdominal: Soft. Bowel sounds are normal. He exhibits no distension and no mass. There is no tenderness. There is no rebound and no guarding.  Lymphadenopathy:    He has no cervical adenopathy.  Skin: No rash noted.          Assessment & Plan:  Fleeting diffuse upper abdominal pains. Question neuropathic though symptoms are bilateral. Nonfocal exam.

## 2011-08-29 NOTE — Patient Instructions (Signed)
Work on weight loss Follow up immediately for any shortness of breath or worsening pain.

## 2011-09-28 ENCOUNTER — Other Ambulatory Visit: Payer: Self-pay | Admitting: Cardiology

## 2011-09-28 DIAGNOSIS — N529 Male erectile dysfunction, unspecified: Secondary | ICD-10-CM

## 2011-09-28 MED ORDER — TADALAFIL 10 MG PO TABS: 10.0000 mg | ORAL_TABLET | Freq: Every day | ORAL | Status: DC | PRN

## 2011-09-28 NOTE — Telephone Encounter (Signed)
Pt needs Rx for cialus 10mg  sent in to [facility] in [place]

## 2011-09-28 NOTE — Telephone Encounter (Signed)
Ok to fill 

## 2011-09-28 NOTE — Telephone Encounter (Signed)
Yes okay to fill the Cialis prescription #6 refill x3

## 2011-09-28 NOTE — Telephone Encounter (Signed)
Sent to pharmacy 

## 2011-10-07 ENCOUNTER — Telehealth: Payer: Self-pay | Admitting: Cardiology

## 2011-10-07 NOTE — Telephone Encounter (Signed)
Patient has picked up Rx. That he was requesting per pharmacy

## 2011-10-07 NOTE — Telephone Encounter (Signed)
Refill F/U  Patient called said pharmacy told him they could not refill this RX as it has expired.  I verified pharmacy is listed correct and let patient know it was sent out as of 09/28/11.  Please contact pharmacy to expedite the order.  Patient also requests a return call with status update on hm# [phone]

## 2011-11-18 ENCOUNTER — Encounter: Payer: Self-pay | Admitting: Cardiology

## 2011-12-19 ENCOUNTER — Ambulatory Visit (INDEPENDENT_AMBULATORY_CARE_PROVIDER_SITE_OTHER): Payer: Medicare Other | Admitting: Nurse Practitioner

## 2011-12-19 ENCOUNTER — Encounter: Payer: Self-pay | Admitting: Nurse Practitioner

## 2011-12-19 ENCOUNTER — Telehealth: Payer: Self-pay | Admitting: *Deleted

## 2011-12-19 ENCOUNTER — Other Ambulatory Visit: Payer: Medicare Other

## 2011-12-19 VITALS — BP 136/90 | HR 60 | Ht 70.5 in | Wt 223.0 lb

## 2011-12-19 DIAGNOSIS — E785 Hyperlipidemia, unspecified: Secondary | ICD-10-CM

## 2011-12-19 DIAGNOSIS — E7800 Pure hypercholesterolemia, unspecified: Secondary | ICD-10-CM

## 2011-12-19 DIAGNOSIS — R0789 Other chest pain: Secondary | ICD-10-CM

## 2011-12-19 LAB — BASIC METABOLIC PANEL
BUN: 15 mg/dL (ref 6–23)
CO2: 25 mEq/L (ref 19–32)
Calcium: 8.6 mg/dL (ref 8.4–10.5)
Chloride: 107 mEq/L (ref 96–112)
Creatinine, Ser: 0.9 mg/dL (ref 0.4–1.5)
GFR: 91.53 mL/min (ref >60.00)
Glucose, Bld: 101 mg/dL — ABNORMAL HIGH (ref 70–99)
Potassium: 4.3 mEq/L (ref 3.5–5.1)
Sodium: 141 mEq/L (ref 135–145)

## 2011-12-19 LAB — HEPATIC FUNCTION PANEL
ALT: 28 U/L (ref 0–53)
AST: 34 U/L (ref 0–37)
Albumin: 3.7 g/dL (ref 3.5–5.2)
Alkaline Phosphatase: 49 U/L (ref 39–117)
Bilirubin, Direct: 0 mg/dL (ref 0.0–0.3)
Total Bilirubin: 0.8 mg/dL (ref 0.3–1.2)
Total Protein: 6.6 g/dL (ref 6.0–8.3)

## 2011-12-19 LAB — LIPID PANEL
Cholesterol: 166 mg/dL (ref 0–200)
HDL: 40.8 mg/dL (ref >39.00)
LDL Cholesterol: 113 mg/dL — ABNORMAL HIGH (ref 0–99)
Total CHOL/HDL Ratio: 4
Triglycerides: 60 mg/dL (ref 0.0–149.0)
VLDL: 12 mg/dL (ref 0.0–40.0)

## 2011-12-19 NOTE — Patient Instructions (Signed)
Regular exercise is encouraged.  Monitor your blood pressure at home.  We will recheck your cholesterol levels today  We will see you in 6 months.  Call the [facility] office at [phone] if you have any questions, problems or concerns.

## 2011-12-19 NOTE — Telephone Encounter (Signed)
Message copied by Burnell Blanks on Mon Dec 19, 2011  5:33 PM ------      Message from: Rosalio Macadamia      Created: Mon Dec 19, 2011  2:38 PM       Ok to report. Labs are satisfactory.  Blood sugar remains mildly elevated. Still needs to work on weight loss and engage in regular exercise. Liver tests ok. Recheck in 6 months.

## 2011-12-19 NOTE — Assessment & Plan Note (Signed)
He has no current complaints. Had a normal stress test in 2011.

## 2011-12-19 NOTE — Assessment & Plan Note (Signed)
He seems to be doing well with no complaints. Needs to exercise regularly and work on his weight. We are rechecking his labs today. He may be agreeable to trying Lipitor again on a 3 to 4 times a week schedule. Will see him back in 6 months. I have asked him to keep a check on his blood pressure at home. Patient is agreeable to this plan and will call if any problems develop in the interim.

## 2011-12-19 NOTE — Progress Notes (Addendum)
   Joe Davis Date of Birth: 1946-06-20 Medical Record #[id number]  History of Present Illness: [name] is seen back today for a 6 month visit. He is seen for Dr. Patty Sermons. He has HLD and obesity. Has been very reluctant to go back on statin. We are checking labs today. He says he is feeling ok. Does not really exercise due to back pain. No chest pain. Not short of breath. Not dizzy or lightheaded. Has no complaints at the present time. He has been using some OTC supplement for his cholesterol levels.   Current Outpatient Prescriptions on File Prior to Visit  Medication Sig Dispense Refill  . allopurinol (ZYLOPRIM) 300 MG tablet Take 300 mg by mouth as needed.        Marland Kitchen aspirin 81 MG tablet Take 81 mg by mouth daily. Taking occ.      . fish oil-omega-3 fatty acids 1000 MG capsule Take by mouth daily.        Marland Kitchen OVER THE COUNTER MEDICATION 2 (two) times daily. CHOLESTOFF       . tadalafil (CIALIS) 10 MG tablet Take 1 tablet (10 mg total) by mouth daily as needed for erectile dysfunction.  6 tablet  3  . DISCONTD: allopurinol (ZYLOPRIM) 300 MG tablet Take 1 tablet (300 mg total) by mouth daily.  30 tablet  0    Allergies  Allergen Reactions  . Crestor (Rosuvastatin Calcium)     LEG PAIN  . Lipitor (Atorvastatin Calcium)     LEG PAIN    Past Medical History  Diagnosis Date  . Hypercholesterolemia     ? statin intolerant. May tolerate Lipitor  . DVT (deep venous thrombosis) 1979  . Pulmonary embolism   . Basal cell carcinoma   . Syncope and collapse   . Bradycardia     asymptomatic  . Elevated CK 2011  . BPH (benign prostatic hyperplasia)   . Normal nuclear stress test May 2011  . Erectile dysfunction   . Diastolic dysfunction     Per echo in 2008  . Obesity     Past Surgical History  Procedure Date  . Cardiovascular stress test May 2011    EF 73%; No ischemia    History  Smoking status  . Former Smoker  . Quit date: 08/15/1977  Smokeless tobacco  . Not on file      History  Alcohol Use  . Yes    daily use    History reviewed. No pertinent family history.  Review of Systems: The review of systems is per the HPI.  All other systems were reviewed and are negative.  Physical Exam: BP 136/90  Pulse 60  Ht 5' 10.5" (1.791 m)  Wt 223 lb (101.152 kg)  BMI 31.54 kg/m2 Repeat blood pressure by me is 130/90. He did not weigh today and reported the same weight to the MA today. Patient is pleasant and in no acute distress. He is obese. Skin is warm and dry. Color is normal.  HEENT is unremarkable. Normocephalic/atraumatic. PERRL. Sclera are nonicteric. Neck is supple. No masses. No JVD. Lungs are clear. Cardiac exam shows a regular rate and rhythm. Abdomen is obese but soft. Extremities are without edema. Gait and ROM are intact. No gross neurologic deficits noted.  LABORATORY DATA: PENDING   Assessment / Plan:

## 2011-12-19 NOTE — Telephone Encounter (Signed)
Mailed patient copy and highlighted comment

## 2012-02-24 ENCOUNTER — Encounter (HOSPITAL_COMMUNITY): Payer: Self-pay

## 2012-02-24 ENCOUNTER — Emergency Department (HOSPITAL_COMMUNITY): Payer: Medicare Other

## 2012-02-24 ENCOUNTER — Emergency Department (HOSPITAL_COMMUNITY)
Admission: EM | Admit: 2012-02-24 | Discharge: 2012-02-24 | Disposition: A | Discharge status: Home / Self Care | Payer: Medicare Other | Attending: Emergency Medicine | Admitting: Emergency Medicine

## 2012-02-24 DIAGNOSIS — Z79899 Other long term (current) drug therapy: Secondary | ICD-10-CM | POA: Insufficient documentation

## 2012-02-24 DIAGNOSIS — E7800 Pure hypercholesterolemia, unspecified: Secondary | ICD-10-CM | POA: Insufficient documentation

## 2012-02-24 DIAGNOSIS — R109 Unspecified abdominal pain: Secondary | ICD-10-CM | POA: Insufficient documentation

## 2012-02-24 DIAGNOSIS — R0602 Shortness of breath: Secondary | ICD-10-CM | POA: Insufficient documentation

## 2012-02-24 DIAGNOSIS — R0609 Other forms of dyspnea: Secondary | ICD-10-CM | POA: Insufficient documentation

## 2012-02-24 DIAGNOSIS — IMO0001 Reserved for inherently not codable concepts without codable children: Secondary | ICD-10-CM | POA: Insufficient documentation

## 2012-02-24 DIAGNOSIS — R0989 Other specified symptoms and signs involving the circulatory and respiratory systems: Secondary | ICD-10-CM | POA: Insufficient documentation

## 2012-02-24 DIAGNOSIS — Z86718 Personal history of other venous thrombosis and embolism: Secondary | ICD-10-CM | POA: Insufficient documentation

## 2012-02-24 DIAGNOSIS — R079 Chest pain, unspecified: Secondary | ICD-10-CM | POA: Insufficient documentation

## 2012-02-24 DIAGNOSIS — M7918 Myalgia, other site: Secondary | ICD-10-CM

## 2012-02-24 LAB — CBC
HCT: 41.8 % (ref 39.0–52.0)
Hemoglobin: 14.6 g/dL (ref 13.0–17.0)
MCH: 30.8 pg (ref 26.0–34.0)
MCHC: 34.9 g/dL (ref 30.0–36.0)
MCV: 88.2 fL (ref 78.0–100.0)

## 2012-02-24 LAB — LIPASE, BLOOD: Lipase: 40 U/L (ref 11–59)

## 2012-02-24 LAB — COMPREHENSIVE METABOLIC PANEL
BUN: 20 mg/dL (ref 6–23)
Calcium: 9 mg/dL (ref 8.4–10.5)
Creatinine, Ser: 0.71 mg/dL (ref 0.50–1.35)
GFR calc Af Amer: >90 mL/min (ref >90)
Glucose, Bld: 104 mg/dL — ABNORMAL HIGH (ref 70–99)
Total Protein: 6.6 g/dL (ref 6.0–8.3)

## 2012-02-24 LAB — URINALYSIS, ROUTINE W REFLEX MICROSCOPIC
Bilirubin Urine: NEGATIVE
Nitrite: NEGATIVE
Protein, ur: NEGATIVE mg/dL
Specific Gravity, Urine: 1.03 (ref 1.005–1.030)
Urobilinogen, UA: 0.2 mg/dL (ref 0.0–1.0)

## 2012-02-24 MED ORDER — KETOROLAC TROMETHAMINE 30 MG/ML IJ SOLN
30.0000 mg | Freq: Once | INTRAMUSCULAR | Status: DC
  Filled 2012-02-24: qty 1

## 2012-02-24 MED ORDER — IOHEXOL 350 MG/ML SOLN
100.0000 mL | Freq: Once | INTRAVENOUS | Status: AC | PRN
  Administered 2012-02-24: 100 mL via INTRAVENOUS

## 2012-02-24 MED ORDER — NAPROXEN 375 MG PO TABS: 375.0000 mg | ORAL_TABLET | Freq: Two times a day (BID) | ORAL | Status: DC

## 2012-02-24 MED ORDER — SODIUM CHLORIDE 0.9 % IV SOLN
INTRAVENOUS | Status: DC
  Administered 2012-02-24: 07:00:00 via INTRAVENOUS

## 2012-02-24 NOTE — ED Notes (Signed)
Pt states abdominal pain x 2 days.  No n/v/d.  C/O increased pain with breathing.  No sob. No cough/congestion.  Urinating normal.  Bowels normal.  Passing gas and is increased.  No bleeding noted.

## 2012-02-24 NOTE — ED Notes (Signed)
Pt c/o sharp pain in side of abdomen that started 2 days ago when he was going to bed about to lay down. Reports pain is worse when he breathes in and is relieved when he eats. Denies N/V/D. Hx thromboembolism in 1979 that went to his lungs and now 1/8th of right lung is missing.

## 2012-02-24 NOTE — ED Notes (Signed)
Patient is alert and oriented x3.  She was given DC instructions and follow up visit instructions.  Patient gave verbal understanding. She was DC ambulatory under his own power to home.  V/S stable.  He was not showing any signs of distress on DC 

## 2012-02-24 NOTE — ED Provider Notes (Signed)
History     CSN: [id number]  Arrival date & time 02/24/12  7829   First MD Initiated Contact with Patient 02/24/12 0615      Chief Complaint  Patient presents with  . Abdominal Pain    (Consider location/radiation/quality/duration/timing/severity/associated sxs/prior treatment) HPI Comments: Patient with a history of bradycardia, DVT and PE in 1979, and basal cell carcinoma presents emergency department with a chief complaint of bilateral side pain.  Onset of symptoms began Wednesday evening and is located in the lower ribs bilaterally.  Pain was intermittent and pleuritic in nature however today it has become constant and sharp.  Severity rated at a 7/10 but is currently asymptomatic  Associated symptoms include dyspnea on exertion and shortness of breath, but patient denies any chest pain, cough, hemoptysis, leg swelling, claudication, recent travel, fever, night sweats, chills, abdominal pain, change in bowel movements.  Last normal bowel movement was this morning.  Patient denies taking anything for pain  Of note patient has been doing more physical labor times one week consisting of chopping large amounts of wood.  Patient has no other complaints this time.  Patient is a 66 y.o. male presenting with abdominal pain. The history is provided by the patient.  Abdominal Pain The primary symptoms of the illness include abdominal pain.    Past Medical History  Diagnosis Date  . Hypercholesterolemia     ? statin intolerant. May tolerate Lipitor  . DVT (deep venous thrombosis) 1979  . Pulmonary embolism   . Basal cell carcinoma   . Syncope and collapse   . Bradycardia     asymptomatic  . Elevated CK 2011  . BPH (benign prostatic hyperplasia)   . Normal nuclear stress test May 2011  . Erectile dysfunction   . Diastolic dysfunction     Per echo in 2008  . Obesity     Past Surgical History  Procedure Date  . Cardiovascular stress test May 2011    EF 73%; No ischemia    History  reviewed. No pertinent family history.  History  Substance Use Topics  . Smoking status: Former Smoker    Quit date: 08/15/1977  . Smokeless tobacco: Not on file  . Alcohol Use: Yes     daily use      Review of Systems  Gastrointestinal: Positive for abdominal pain.  All other systems reviewed and are negative.    Allergies  Crestor and Lipitor  Home Medications   Current Outpatient Rx  Name Route Sig Dispense Refill  . ASPIRIN 81 MG PO TABS Oral Take 81 mg by mouth once.     . OMEGA-3 FATTY ACIDS 1000 MG PO CAPS Oral Take by mouth daily.      Marland Kitchen OVER THE COUNTER MEDICATION Oral Take 1 capsule by mouth 2 (two) times daily. OTC. CHOLESTOFF    . PSYLLIUM 95 % PO PACK Oral Take 1 packet by mouth at bedtime.    Marland Kitchen TADALAFIL 10 MG PO TABS Oral Take 10 mg by mouth daily as needed. For erectile dysfunction      BP 130/78  Pulse 53  Temp 98 F (36.7 C) (Oral)  Resp 17  SpO2 99%  Physical Exam  Nursing note and vitals reviewed. Constitutional: He is oriented to person, place, and time. He appears well-developed and well-nourished. No distress.  HENT:  Head: Normocephalic and atraumatic.  Eyes: Conjunctivae and EOM are normal.  Neck: Normal range of motion.  Cardiovascular:       Regular rate  rhythm, intact distal pulses, no pitting edema bilaterally.  Compression stocking on right lower extremity  Pulmonary/Chest: Effort normal.       Lungs clear to auscultation bilaterally.  No respiratory distress, accessory muscle use or nasal flaring.  Chest nontender to palpation, pain not reproducible  Abdominal:       Bowel sounds normal Soft obese abdomen without tenderness to palpation throughout.  Musculoskeletal: Normal range of motion.  Neurological: He is alert and oriented to person, place, and time.  Skin: Skin is warm and dry. No rash noted. He is not diaphoretic.  Psychiatric: He has a normal mood and affect. His behavior is normal.    ED Course  Procedures (including  critical care time)  Labs Reviewed  COMPREHENSIVE METABOLIC PANEL - Abnormal; Notable for the following:    Glucose, Bld 104 (*)     Albumin 3.4 (*)     All other components within normal limits  CBC  LIPASE, BLOOD  URINALYSIS, ROUTINE W REFLEX MICROSCOPIC   Dg Chest 2 View  02/24/2012  *RADIOLOGY REPORT*  Clinical Data: Shortness of breath and bilateral flank pain.  CHEST - 2 VIEW  Comparison: Chest radiograph performed 03/28/2008  Findings: The lungs are well-aerated.  Mild bibasilar opacities likely reflect atelectasis and scarring, similar in appearance to 2009.  There is no evidence of focal opacification, pleural effusion or pneumothorax.  Pulmonary vascularity is at the upper limits of normal.  There is chronic right lateral pleural thickening.  The heart is borderline normal in size; the mediastinal contour is within normal limits.  No acute osseous abnormalities are seen.  IMPRESSION: Mild bibasilar airspace opacities appear stable and likely reflect atelectasis and scarring; lungs otherwise clear.  Original Report Authenticated By: Tonia Ghent, M.D.   Ct Angio Chest W/cm &/or Wo Cm  02/24/2012  *RADIOLOGY REPORT*  Clinical Data:  Bilateral flank and chest pain, especially with deep inspirations, history of pulmonary embolism in 1979  CT ANGIOGRAPHY CHEST WITH CONTRAST  Technique:  Multidetector CT imaging of the chest was performed using the standard protocol during bolus administration of intravenous contrast.  Multiplanar CT image reconstructions including MIPs were obtained to evaluate the vascular anatomy.  Contrast: OMNIPAQUE IOHEXOL 350 MG/ML SOLN  Comparison:  Chest radiograph - earlier same day; 03/28/2008  Vascular Findings:  There is adequate opacification of the pulmonary vasculature within main pulmonary artery measuring 334 HU.  No discrete filling defects within the pulmonary arterial tree to suggest pulmonary embolism.  The caliber of the main pulmonary artery is  normal.  Borderline cardiomegaly.  Coronary artery calcifications.  No pericardial effusion.  Minimal amount of fluid extends into the pericardial recess.  There is minimal reflux of contrast into the hepatic venous system as well as within the azygos system.  Normal caliber of the ascending and descending thoracic aorta. Normal configuration of the aortic arch.  There is minimal atherosclerotic calcifications within the aortic arch.  There is a minimal amount of eccentric noncalcified atherosclerotic plaque within the superior aspect of the descending thoracic aorta (image 46, series four).  No peri aortic stranding.  Review of the MIP images confirms the above findings.  Nonvascular findings:   Grossly symmetric dependent ground-glass opacities favored to represent atelectasis.  There is minimal pleural parenchymal thickening along the peripheral aspect of the right middle lobe (image 51, series 11) as well as within the posterior aspect of the left costophrenic angle (image 68, series 11), favored to represent parenchymal scar.  No focal  airspace opacities.  No pleural effusion or pneumothorax.  The central airways are patent.  No discrete pulmonary nodules.  No definite mediastinal, hilar or axillary lymphadenopathy.  Limited early arterial phase evaluation of the upper abdomen is normal.  No acute or aggressive osseous abnormalities.  Minimal degenerative change of the thoracic spine.  IMPRESSION:  1.  No acute findings within the chest.  Specifically, no evidence of pulmonary embolism or pneumonia. 2. There is minimal reflux of contrast into the hepatic venous system as well as within the azygos system, nonspecific but may be seen in the setting of right sided heart failure.  Further evaluation with cardiac echo may be performed as clinically indicated. 3.  Coronary artery calcifications.  Original Report Authenticated By: Waynard Reeds, M.D.    No diagnosis found.    MDM  Bilateral side pain, likely  musculoskeletal in nature  Images and labs reviewed. No evidence of acute pulmonary etiology. Above findings discussed with Dr. Freida Busman who agrees with my plan to dc the pt with close follow up. Pt had a cardiac stress test in May 2011 without evidence of ischemia and EF 73%. CXR showed no evidence of effusion. Pt advised to f-u with his cardiologist in regards to incidental finding on CT.  At this time there does not appear to be any evidence of an acute emergency medical condition and the patient appears stable for discharge with appropriate outpatient follow up.Diagnosis was discussed with patient who verbalizes understanding and is agreeable to discharge. Pt case discussed with Dr. Freida Busman who agrees with my plan.           Jaci Carrel, [place] 02/24/12 [phone]

## 2012-02-25 NOTE — ED Provider Notes (Signed)
Medical screening examination/treatment/procedure(s) were performed by non-physician practitioner and as supervising physician I was immediately available for consultation/collaboration.  Ulric Salzman, MD 02/25/12 0209 

## 2012-03-05 ENCOUNTER — Telehealth: Payer: Self-pay | Admitting: Cardiology

## 2012-03-05 DIAGNOSIS — R079 Chest pain, unspecified: Secondary | ICD-10-CM

## 2012-03-05 NOTE — Telephone Encounter (Signed)
PT CALLING RE FLUID AROUND HEART, HAS RESULTS IN EPIC , PLS CALL PT TO ADVISE

## 2012-03-05 NOTE — Telephone Encounter (Signed)
Emergency center at [facility] (7/12)and was diagnosed with muscle strains (was splitting wood).  Was told he had some fluid around heart.  Denies any swelling in ankles or feet or shortness of breath.  Will forward to  Dr. Patty Sermons for review

## 2012-03-05 NOTE — Telephone Encounter (Signed)
His CT angio of 02/24/12 suggested possible right heart failure. Radiologist suggested 2 D echo to evaluate further.  Get 2D echo to evaluate LV and RV size and function.

## 2012-03-05 NOTE — Telephone Encounter (Signed)
Left message to call back  

## 2012-03-07 NOTE — Telephone Encounter (Signed)
Advised patient and scheduled  

## 2012-03-14 ENCOUNTER — Ambulatory Visit (HOSPITAL_COMMUNITY): Payer: Medicare Other | Attending: Cardiovascular Disease | Admitting: Radiology

## 2012-03-14 DIAGNOSIS — I079 Rheumatic tricuspid valve disease, unspecified: Secondary | ICD-10-CM | POA: Insufficient documentation

## 2012-03-14 DIAGNOSIS — E785 Hyperlipidemia, unspecified: Secondary | ICD-10-CM | POA: Insufficient documentation

## 2012-03-14 DIAGNOSIS — R9430 Abnormal result of cardiovascular function study, unspecified: Secondary | ICD-10-CM | POA: Insufficient documentation

## 2012-03-14 DIAGNOSIS — I517 Cardiomegaly: Secondary | ICD-10-CM

## 2012-03-14 DIAGNOSIS — I379 Nonrheumatic pulmonary valve disorder, unspecified: Secondary | ICD-10-CM | POA: Insufficient documentation

## 2012-03-14 DIAGNOSIS — E669 Obesity, unspecified: Secondary | ICD-10-CM | POA: Insufficient documentation

## 2012-03-14 DIAGNOSIS — R079 Chest pain, unspecified: Secondary | ICD-10-CM

## 2012-03-14 NOTE — Progress Notes (Signed)
Echocardiogram performed.  

## 2012-03-19 ENCOUNTER — Telehealth: Payer: Self-pay | Admitting: *Deleted

## 2012-03-19 NOTE — Telephone Encounter (Signed)
Advised of echo  

## 2012-03-19 NOTE — Telephone Encounter (Signed)
Message copied by Burnell Blanks on Mon Mar 19, 2012  6:45 PM ------      Message from: Cassell Clement      Created: Thu Mar 15, 2012  2:51 PM       Please report.  The echo showed normal right heart pressures.  No evidence of right heart failure. Good LV systolic function. There is diastolic dysfunction. Continue same meds. Careful diet, continue exercise etc.

## 2012-09-11 DIAGNOSIS — IMO0002 Reserved for concepts with insufficient information to code with codable children: Secondary | ICD-10-CM | POA: Diagnosis not present

## 2012-09-17 ENCOUNTER — Other Ambulatory Visit: Payer: Self-pay | Admitting: Orthopedic Surgery

## 2012-09-17 MED ORDER — DEXAMETHASONE SODIUM PHOSPHATE 10 MG/ML IJ SOLN: 10.0000 mg | Freq: Once | INTRAMUSCULAR | Status: DC

## 2012-09-17 NOTE — Progress Notes (Signed)
Preoperative surgical orders have been place into the Epic hospital system for KENGO STURGES on 09/17/2012, 7:19 AM  by Patrica Duel for surgery on 10/03/2012.  Preop Knee Scope orders including IV Tylenol and IV Decadron as long as there are no contraindications to the above medications. Avel Peace, PA-C

## 2012-09-21 ENCOUNTER — Encounter (HOSPITAL_COMMUNITY): Payer: Self-pay | Admitting: Pharmacy Technician

## 2012-09-24 ENCOUNTER — Other Ambulatory Visit (HOSPITAL_COMMUNITY): Payer: Self-pay | Admitting: Orthopedic Surgery

## 2012-09-24 ENCOUNTER — Ambulatory Visit: Payer: Medicare Other | Admitting: Nurse Practitioner

## 2012-09-25 ENCOUNTER — Encounter (HOSPITAL_COMMUNITY)
Admission: RE | Admit: 2012-09-25 | Discharge: 2012-09-25 | Disposition: A | Discharge status: Home / Self Care | Payer: Medicare Other | Source: Ambulatory Visit | Attending: Orthopedic Surgery | Admitting: Orthopedic Surgery

## 2012-09-25 ENCOUNTER — Ambulatory Visit (HOSPITAL_BASED_OUTPATIENT_CLINIC_OR_DEPARTMENT_OTHER): Payer: Medicare Other

## 2012-09-25 ENCOUNTER — Ambulatory Visit (INDEPENDENT_AMBULATORY_CARE_PROVIDER_SITE_OTHER): Payer: Medicare Other | Admitting: Nurse Practitioner

## 2012-09-25 ENCOUNTER — Encounter (HOSPITAL_COMMUNITY): Payer: Self-pay

## 2012-09-25 ENCOUNTER — Encounter: Payer: Self-pay | Admitting: Nurse Practitioner

## 2012-09-25 VITALS — BP 138/76 | HR 60 | Ht 70.5 in | Wt 236.8 lb

## 2012-09-25 DIAGNOSIS — E663 Overweight: Secondary | ICD-10-CM

## 2012-09-25 DIAGNOSIS — Z7982 Long term (current) use of aspirin: Secondary | ICD-10-CM | POA: Diagnosis not present

## 2012-09-25 DIAGNOSIS — Z79899 Other long term (current) drug therapy: Secondary | ICD-10-CM | POA: Diagnosis not present

## 2012-09-25 DIAGNOSIS — I519 Heart disease, unspecified: Secondary | ICD-10-CM | POA: Diagnosis not present

## 2012-09-25 DIAGNOSIS — S83289A Other tear of lateral meniscus, current injury, unspecified knee, initial encounter: Secondary | ICD-10-CM | POA: Diagnosis not present

## 2012-09-25 DIAGNOSIS — I5189 Other ill-defined heart diseases: Secondary | ICD-10-CM

## 2012-09-25 DIAGNOSIS — Z86711 Personal history of pulmonary embolism: Secondary | ICD-10-CM | POA: Diagnosis not present

## 2012-09-25 DIAGNOSIS — M2240 Chondromalacia patellae, unspecified knee: Secondary | ICD-10-CM | POA: Diagnosis not present

## 2012-09-25 DIAGNOSIS — IMO0002 Reserved for concepts with insufficient information to code with codable children: Secondary | ICD-10-CM | POA: Diagnosis not present

## 2012-09-25 DIAGNOSIS — E785 Hyperlipidemia, unspecified: Secondary | ICD-10-CM | POA: Diagnosis not present

## 2012-09-25 DIAGNOSIS — Z86718 Personal history of other venous thrombosis and embolism: Secondary | ICD-10-CM | POA: Diagnosis not present

## 2012-09-25 DIAGNOSIS — R0989 Other specified symptoms and signs involving the circulatory and respiratory systems: Secondary | ICD-10-CM

## 2012-09-25 DIAGNOSIS — E7800 Pure hypercholesterolemia, unspecified: Secondary | ICD-10-CM | POA: Diagnosis not present

## 2012-09-25 LAB — CBC WITH DIFFERENTIAL/PLATELET
Basophils Absolute: 0 10*3/uL (ref 0.0–0.1)
Basophils Relative: 0.8 % (ref 0.0–3.0)
Eosinophils Absolute: 0.2 10*3/uL (ref 0.0–0.7)
Eosinophils Relative: 3.2 % (ref 0.0–5.0)
HCT: 45.2 % (ref 39.0–52.0)
Hemoglobin: 15.6 g/dL (ref 13.0–17.0)
Lymphocytes Relative: 26.6 % (ref 12.0–46.0)
Lymphs Abs: 1.5 10*3/uL (ref 0.7–4.0)
MCHC: 34.4 g/dL (ref 30.0–36.0)
MCV: 88.9 fl (ref 78.0–100.0)
Monocytes Absolute: 0.7 10*3/uL (ref 0.1–1.0)
Monocytes Relative: 12.2 % — ABNORMAL HIGH (ref 3.0–12.0)
Neutro Abs: 3.2 10*3/uL (ref 1.4–7.7)
Neutrophils Relative %: 57.2 % (ref 43.0–77.0)
Platelets: 206 10*3/uL (ref 150.0–400.0)
RBC: 5.09 Mil/uL (ref 4.22–5.81)
RDW: 13.2 % (ref 11.5–14.6)
WBC: 5.7 10*3/uL (ref 4.5–10.5)

## 2012-09-25 LAB — BASIC METABOLIC PANEL
BUN: 14 mg/dL (ref 6–23)
CO2: 26 mEq/L (ref 19–32)
Calcium: 9 mg/dL (ref 8.4–10.5)
Chloride: 105 mEq/L (ref 96–112)
Creatinine, Ser: 0.8 mg/dL (ref 0.4–1.5)
GFR: 96.36 mL/min (ref >60.00)
Glucose, Bld: 105 mg/dL — ABNORMAL HIGH (ref 70–99)
Potassium: 4.5 mEq/L (ref 3.5–5.1)
Sodium: 138 mEq/L (ref 135–145)

## 2012-09-25 LAB — HEPATIC FUNCTION PANEL
ALT: 24 U/L (ref 0–53)
AST: 27 U/L (ref 0–37)
Albumin: 3.8 g/dL (ref 3.5–5.2)
Alkaline Phosphatase: 59 U/L (ref 39–117)
Bilirubin, Direct: 0.1 mg/dL (ref 0.0–0.3)
Total Bilirubin: 0.7 mg/dL (ref 0.3–1.2)
Total Protein: 7 g/dL (ref 6.0–8.3)

## 2012-09-25 LAB — SURGICAL PCR SCREEN: MRSA, PCR: NEGATIVE

## 2012-09-25 LAB — TSH: TSH: 1.68 u[IU]/mL (ref 0.35–5.50)

## 2012-09-25 LAB — LIPID PANEL
Cholesterol: 203 mg/dL — ABNORMAL HIGH (ref 0–200)
HDL: 39.4 mg/dL (ref >39.00)
Total CHOL/HDL Ratio: 5
Triglycerides: 152 mg/dL — ABNORMAL HIGH (ref 0.0–149.0)
VLDL: 30.4 mg/dL (ref 0.0–40.0)

## 2012-09-25 LAB — LDL CHOLESTEROL, DIRECT: Direct LDL: 125.7 mg/dL

## 2012-09-25 NOTE — Progress Notes (Addendum)
Joe Davis Date of Birth: 09/14/1945 Medical Record #[id number]  History of Present Illness: Joe Davis is seen back today for an approximate 9 month check. He is seen for Dr. Patty Sermons. He has HLD, obesity and past history of PE. He is limited by back pain. He is reluctant to take prescription medicines.   He was last here in May of last year. Was doing ok. Tried to get him on low dose statin therapy. He takes an OTC agent instead.  He comes in today. He is here alone. He is doing ok. Limited by back pain and knee pain. For arthroscopy later this month on his knee. No chest pain. Some DOE. Very inactive. Weight is up and he was weighed here today. No chest pain. Was in the ER back in the summer with bilateral side pain. Negative CT for PE but there was concern for right sided heart failure/strain. He is not aware of this finding. He says he did not have an echo based on this CT scan.   Current Outpatient Prescriptions on File Prior to Visit  Medication Sig Dispense Refill  . aspirin 81 MG tablet Take 81 mg by mouth every morning.       . fish oil-omega-3 fatty acids 1000 MG capsule Take by mouth daily.        Marland Kitchen ibuprofen (ADVIL,MOTRIN) 200 MG tablet Take 200 mg by mouth every 6 (six) hours as needed. For pain      . OVER THE COUNTER MEDICATION Take 1 capsule by mouth 2 (two) times daily. OTC. CHOLESTOFF      . tadalafil (CIALIS) 10 MG tablet Take 10 mg by mouth daily as needed. For erectile dysfunction       No current facility-administered medications on file prior to visit.    Allergies  Allergen Reactions  . Crestor (Rosuvastatin Calcium)     LEG PAIN  . Lipitor (Atorvastatin Calcium)     LEG PAIN    Past Medical History  Diagnosis Date  . Hypercholesterolemia     ? statin intolerant. May tolerate Lipitor  . DVT (deep venous thrombosis) 1979  . Pulmonary embolism   . Basal cell carcinoma   . Syncope and collapse   . Bradycardia     asymptomatic  . Elevated CK 2011    . BPH (benign prostatic hyperplasia)   . Normal nuclear stress test May 2011  . Erectile dysfunction   . Diastolic dysfunction     Per echo in 2008  . Obesity     Past Surgical History  Procedure Laterality Date  . Cardiovascular stress test  May 2011    EF 73%; No ischemia    History  Smoking status  . Former Smoker  . Quit date: 08/15/1977  Smokeless tobacco  . Not on file    History  Alcohol Use  . Yes    Comment: daily use    History reviewed. No pertinent family history.  Review of Systems: The review of systems is per the HPI.  All other systems were reviewed and are negative.  Physical Exam: BP 138/76  Pulse 60  Ht 5' 10.5" (1.791 m)  Wt 236 lb 12.8 oz (107.412 kg)  BMI 33.49 kg/m2 Patient is in no acute distress. He remains obese. He was weighed today. Skin is warm and dry. Color is normal.  HEENT is unremarkable. Normocephalic/atraumatic. PERRL. Sclera are nonicteric. Neck is supple. No masses. No JVD. Lungs are clear. Cardiac exam shows a regular rate  and rhythm. Abdomen is obese but soft. Extremities are without edema. Gait and ROM are intact. No gross neurologic deficits noted.   LABORATORY DATA: Pending for today.    Chemistry      Component Value Date/Time   NA 140 02/24/2012 0625   K 4.0 02/24/2012 0625   CL 107 02/24/2012 0625   CO2 23 02/24/2012 0625   BUN 20 02/24/2012 0625   CREATININE 0.71 02/24/2012 0625      Component Value Date/Time   CALCIUM 9.0 02/24/2012 0625   ALKPHOS 57 02/24/2012 0625   AST 23 02/24/2012 0625   ALT 20 02/24/2012 0625   BILITOT 0.5 02/24/2012 0625     Lab Results  Component Value Date   CHOL 166 12/19/2011   HDL 40.80 12/19/2011   LDLCALC 113* 12/19/2011   LDLDIRECT 161.2 01/28/2011   TRIG 60.0 12/19/2011   CHOLHDL 4 12/19/2011   CT SCAN CHEST IMPRESSION:  1. No acute findings within the chest. Specifically, no evidence of pulmonary embolism or pneumonia. 2. There is minimal reflux of contrast into the hepatic venous  system as well as within the azygos system, nonspecific but may be seen in the setting of right sided heart failure. Further evaluation with cardiac echo may be performed as clinically indicated. 3. Coronary artery calcifications.   Original Report Authenticated By: Waynard Reeds, M.D.  Study Conclusions   ECHO from July 2013 - Left ventricle: The cavity size was normal. Wall thickness was increased in a pattern of mild LVH. There was mild concentric hypertrophy. Systolic function was normal. The estimated ejection fraction was in the range of 55% to 60%. Wall motion was normal; there were no regional wall motion abnormalities. Doppler parameters are consistent with abnormal left ventricular relaxation (grade 1 diastolic dysfunction). - Mitral valve: Calcified anterior leaflet tip - Left atrium: The atrium was mildly dilated. - Atrial septum: No defect or patent foramen ovale was identified.    Assessment / Plan:  1. HLD - on OTC agent. Recheck labs today. He is not agreeable to taking statin medicines.   2. Obesity - this is the crux of his issues - now limited by knee pain.   3. Remote PE - resolved  4. Abnormal CT of the chest - he has known diastolic dysfunction. Will update his echo. We have gone back to look in his record. He has had this study completed. No need to reorder. Has known diastolic dysfunction with a normal EF. No change in his current regimen.   5. Pre op for knee surgery - should be an acceptable candidate.   We will see him back in one year. He identifies me as his primary care provider. I told him I am not able to be his PCP. He will see Dr. Patty Sermons in one year with repeat labs.  Patient is agreeable to this plan and will call if any problems develop in the interim.

## 2012-09-25 NOTE — Addendum Note (Signed)
Addended by: Rosalio Macadamia on: 09/25/2012 10:25 AM   Modules accepted: Orders

## 2012-09-25 NOTE — Patient Instructions (Addendum)
18 Joe Davis  09/25/2012   Your procedure is scheduled on: 10-03-2012  Report to [facility] at 0800 AM.  Call this number if you have problems the morning of surgery [phone]   Remember:   Do not eat food or drink liquids :After Midnight.     Take these medicines the morning of surgery with A SIP OF WATER:  No meds to take                                SEE [facility] PREPARING FOR SURGERY SHEET   Do not wear jewelry, make-up or nail polish.  Do not wear lotions, powders, or perfumes. You may wear deodorant.   Men may shave face and neck.  Do not bring valuables to the hospital.  Contacts, dentures or bridgework may not be worn into surgery.  Leave suitcase in the car. After surgery it may be brought to your room.  For patients admitted to the hospital, checkout time is 11:00 AM the day of discharge.   Patients discharged the day of surgery will not be allowed to drive home.  Name and phone number of your driver:joanne gray  Cell [phone]  Special Instructions: N/A   Please read over the following fact sheets that you were given: MRSA Information. Incentive spirometer fact sheet  Call Cain Sieve RN pre op nurse if needed [phone]    FAILURE TO FOLLOW THESE INSTRUCTIONS MAY RESULT IN THE CANCELLATION OF YOUR SURGERY. PATIENT SIGNATURE___________________________________________

## 2012-09-25 NOTE — Patient Instructions (Addendum)
Regular activity and weight loss are strongly encouraged.   We will check labs today  Call the [facility] office at [phone] if you have any questions, problems or concerns.

## 2012-09-26 NOTE — Progress Notes (Signed)
Spoke with [facility] by phone, dr Lequita Halt will decide if pt can have right hip cortisone injection on day of surgery Pt to stop aspirin 1 week prior to surgery and advil 5 to 7 days prior to surgery per [facility]. Called patient by phone and informed patient dr Lequita Halt will discuss right hip cortisone injection on day of surgery and to stop aspirin 1 week before surgery and stop advil 5 to 7 days before surgery per drew [facility]kins instructions.Marland Kitchen

## 2012-10-03 ENCOUNTER — Encounter (HOSPITAL_COMMUNITY)
Admission: RE | Disposition: A | Discharge status: Home / Self Care | Payer: Self-pay | Source: Ambulatory Visit | Attending: Orthopedic Surgery

## 2012-10-03 ENCOUNTER — Ambulatory Visit (HOSPITAL_COMMUNITY)
Admission: RE | Admit: 2012-10-03 | Discharge: 2012-10-03 | Disposition: A | Discharge status: Home / Self Care | Payer: Medicare Other | Source: Ambulatory Visit | Attending: Orthopedic Surgery | Admitting: Orthopedic Surgery

## 2012-10-03 ENCOUNTER — Encounter (HOSPITAL_COMMUNITY): Payer: Self-pay | Admitting: Registered Nurse

## 2012-10-03 ENCOUNTER — Encounter (HOSPITAL_COMMUNITY): Payer: Self-pay | Admitting: *Deleted

## 2012-10-03 ENCOUNTER — Ambulatory Visit (HOSPITAL_COMMUNITY): Payer: Medicare Other | Admitting: Registered Nurse

## 2012-10-03 DIAGNOSIS — Z86718 Personal history of other venous thrombosis and embolism: Secondary | ICD-10-CM | POA: Insufficient documentation

## 2012-10-03 DIAGNOSIS — S83289A Other tear of lateral meniscus, current injury, unspecified knee, initial encounter: Secondary | ICD-10-CM | POA: Diagnosis not present

## 2012-10-03 DIAGNOSIS — N401 Enlarged prostate with lower urinary tract symptoms: Secondary | ICD-10-CM | POA: Diagnosis not present

## 2012-10-03 DIAGNOSIS — E7800 Pure hypercholesterolemia, unspecified: Secondary | ICD-10-CM | POA: Insufficient documentation

## 2012-10-03 DIAGNOSIS — IMO0002 Reserved for concepts with insufficient information to code with codable children: Secondary | ICD-10-CM | POA: Diagnosis not present

## 2012-10-03 DIAGNOSIS — S83249A Other tear of medial meniscus, current injury, unspecified knee, initial encounter: Secondary | ICD-10-CM

## 2012-10-03 DIAGNOSIS — M23302 Other meniscus derangements, unspecified lateral meniscus, unspecified knee: Secondary | ICD-10-CM | POA: Diagnosis not present

## 2012-10-03 DIAGNOSIS — Z86711 Personal history of pulmonary embolism: Secondary | ICD-10-CM | POA: Insufficient documentation

## 2012-10-03 DIAGNOSIS — X58XXXA Exposure to other specified factors, initial encounter: Secondary | ICD-10-CM | POA: Insufficient documentation

## 2012-10-03 DIAGNOSIS — M23205 Derangement of unspecified medial meniscus due to old tear or injury, unspecified knee: Secondary | ICD-10-CM | POA: Diagnosis not present

## 2012-10-03 DIAGNOSIS — M2390 Unspecified internal derangement of unspecified knee: Secondary | ICD-10-CM | POA: Diagnosis not present

## 2012-10-03 DIAGNOSIS — M2240 Chondromalacia patellae, unspecified knee: Secondary | ICD-10-CM | POA: Insufficient documentation

## 2012-10-03 DIAGNOSIS — Z7982 Long term (current) use of aspirin: Secondary | ICD-10-CM | POA: Insufficient documentation

## 2012-10-03 DIAGNOSIS — Z79899 Other long term (current) drug therapy: Secondary | ICD-10-CM | POA: Insufficient documentation

## 2012-10-03 HISTORY — PX: KNEE ARTHROSCOPY: SHX127

## 2012-10-03 SURGERY — ARTHROSCOPY, KNEE
Anesthesia: General | Site: Knee | Laterality: Right | Wound class: Clean

## 2012-10-03 MED ORDER — ACETAMINOPHEN 10 MG/ML IV SOLN
INTRAVENOUS | Status: AC
  Filled 2012-10-03: qty 100

## 2012-10-03 MED ORDER — ONDANSETRON HCL 4 MG/2ML IJ SOLN
INTRAMUSCULAR | Status: DC | PRN
  Administered 2012-10-03: 4 mg via INTRAVENOUS

## 2012-10-03 MED ORDER — SODIUM CHLORIDE 0.9 % IV SOLN: INTRAVENOUS | Status: DC

## 2012-10-03 MED ORDER — KETOROLAC TROMETHAMINE 30 MG/ML IJ SOLN
INTRAMUSCULAR | Status: DC | PRN
  Administered 2012-10-03: 30 mg via INTRAVENOUS

## 2012-10-03 MED ORDER — LACTATED RINGERS IV SOLN
INTRAVENOUS | Status: DC
  Administered 2012-10-03: 1000 mL via INTRAVENOUS
  Administered 2012-10-03: 11:00:00 via INTRAVENOUS

## 2012-10-03 MED ORDER — LACTATED RINGERS IV SOLN: INTRAVENOUS | Status: DC

## 2012-10-03 MED ORDER — OXYCODONE-ACETAMINOPHEN 5-325 MG PO TABS: 1.0000 | ORAL_TABLET | ORAL | Status: DC | PRN

## 2012-10-03 MED ORDER — EPINEPHRINE HCL 1 MG/ML IJ SOLN
INTRAMUSCULAR | Status: AC
  Filled 2012-10-03: qty 2

## 2012-10-03 MED ORDER — PROPOFOL 10 MG/ML IV BOLUS
INTRAVENOUS | Status: DC | PRN
  Administered 2012-10-03: 200 mg via INTRAVENOUS

## 2012-10-03 MED ORDER — LACTATED RINGERS IR SOLN
Status: DC | PRN
  Administered 2012-10-03: 12000 mL

## 2012-10-03 MED ORDER — ACETAMINOPHEN 10 MG/ML IV SOLN
INTRAVENOUS | Status: DC | PRN
  Administered 2012-10-03: 1000 mg via INTRAVENOUS

## 2012-10-03 MED ORDER — DEXAMETHASONE SODIUM PHOSPHATE 10 MG/ML IJ SOLN
INTRAMUSCULAR | Status: DC | PRN
  Administered 2012-10-03: 10 mg via INTRAVENOUS

## 2012-10-03 MED ORDER — HYDROMORPHONE HCL PF 1 MG/ML IJ SOLN: 0.2500 mg | INTRAMUSCULAR | Status: DC | PRN

## 2012-10-03 MED ORDER — LIDOCAINE HCL (CARDIAC) 10 MG/ML IV SOLN
INTRAVENOUS | Status: DC | PRN
  Administered 2012-10-03: 50 mg via INTRAVENOUS

## 2012-10-03 MED ORDER — METHOCARBAMOL 500 MG PO TABS: 500.0000 mg | ORAL_TABLET | Freq: Four times a day (QID) | ORAL | Status: DC | PRN

## 2012-10-03 MED ORDER — PROMETHAZINE HCL 25 MG/ML IJ SOLN: 6.2500 mg | INTRAMUSCULAR | Status: DC | PRN

## 2012-10-03 MED ORDER — CEFAZOLIN SODIUM-DEXTROSE 2-3 GM-% IV SOLR
2.0000 g | INTRAVENOUS | Status: AC
  Administered 2012-10-03: 2 g via INTRAVENOUS

## 2012-10-03 MED ORDER — BUPIVACAINE-EPINEPHRINE 0.25% -1:200000 IJ SOLN
INTRAMUSCULAR | Status: DC | PRN
  Administered 2012-10-03: 50 mL

## 2012-10-03 MED ORDER — ACETAMINOPHEN 10 MG/ML IV SOLN: 1000.0000 mg | Freq: Once | INTRAVENOUS | Status: DC

## 2012-10-03 MED ORDER — CEFAZOLIN SODIUM-DEXTROSE 2-3 GM-% IV SOLR
INTRAVENOUS | Status: AC
  Filled 2012-10-03: qty 50

## 2012-10-03 MED ORDER — FENTANYL CITRATE 0.05 MG/ML IJ SOLN
INTRAMUSCULAR | Status: DC | PRN
  Administered 2012-10-03 (×3): 50 ug via INTRAVENOUS

## 2012-10-03 MED ORDER — MIDAZOLAM HCL 5 MG/5ML IJ SOLN
INTRAMUSCULAR | Status: DC | PRN
  Administered 2012-10-03: 2 mg via INTRAVENOUS
  Administered 2012-10-03: 1 mg via INTRAVENOUS

## 2012-10-03 MED ORDER — BUPIVACAINE-EPINEPHRINE 0.25% -1:200000 IJ SOLN
INTRAMUSCULAR | Status: AC
  Filled 2012-10-03: qty 1

## 2012-10-03 MED ORDER — OXYCODONE-ACETAMINOPHEN 5-325 MG PO TABS
1.0000 | ORAL_TABLET | ORAL | Status: DC | PRN
  Administered 2012-10-03: 1 via ORAL
  Filled 2012-10-03: qty 1

## 2012-10-03 SURGICAL SUPPLY — 24 items
BANDAGE ELASTIC 6 VELCRO ST LF (GAUZE/BANDAGES/DRESSINGS) ×1 IMPLANT
BLADE 4.2CUDA (BLADE) ×2 IMPLANT
CLOTH BEACON ORANGE TIMEOUT ST (SAFETY) ×2 IMPLANT
CUFF TOURN SGL QUICK 34 (TOURNIQUET CUFF) ×2
CUFF TRNQT CYL 34X4X40X1 (TOURNIQUET CUFF) ×1 IMPLANT
DRAPE U-SHAPE 47X51 STRL (DRAPES) ×2 IMPLANT
DRSG EMULSION OIL 3X3 NADH (GAUZE/BANDAGES/DRESSINGS) ×2 IMPLANT
DURAPREP 26ML APPLICATOR (WOUND CARE) ×2 IMPLANT
GLOVE BIO SURGEON STRL SZ8 (GLOVE) ×2 IMPLANT
GLOVE BIOGEL PI IND STRL 8 (GLOVE) ×1 IMPLANT
GLOVE BIOGEL PI INDICATOR 8 (GLOVE) ×1
GOWN STRL NON-REIN LRG LVL3 (GOWN DISPOSABLE) ×3 IMPLANT
MANIFOLD NEPTUNE II (INSTRUMENTS) ×3 IMPLANT
PACK ARTHROSCOPY WL (CUSTOM PROCEDURE TRAY) ×2 IMPLANT
PACK ICE MAXI GEL EZY WRAP (MISCELLANEOUS) ×6 IMPLANT
PADDING CAST COTTON 6X4 STRL (CAST SUPPLIES) ×3 IMPLANT
POSITIONER SURGICAL ARM (MISCELLANEOUS) ×2 IMPLANT
SET ARTHROSCOPY TUBING (MISCELLANEOUS) ×2
SET ARTHROSCOPY TUBING LN (MISCELLANEOUS) ×1 IMPLANT
SPONGE GAUZE 4X4 12PLY (GAUZE/BANDAGES/DRESSINGS) ×1 IMPLANT
SUT ETHILON 4 0 PS 2 18 (SUTURE) ×2 IMPLANT
TOWEL OR 17X26 10 PK STRL BLUE (TOWEL DISPOSABLE) ×2 IMPLANT
WAND 90 DEG TURBOVAC W/CORD (SURGICAL WAND) ×2 IMPLANT
WRAP KNEE MAXI GEL POST OP (GAUZE/BANDAGES/DRESSINGS) ×3 IMPLANT

## 2012-10-03 NOTE — Interval H&P Note (Signed)
History and Physical Interval Note:  10/03/2012 9:58 AM  Joe Davis  has presented today for surgery, with the diagnosis of RIGHT KNEE MEDIAL/LATERAL MENISCUS TEAR  The various methods of treatment have been discussed with the patient and family. After consideration of risks, benefits and other options for treatment, the patient has consented to  Procedure(s) with comments: RIGHT KNEE ARTHROSCOPY WITH DEBRIDEMENT (Right) - WITH DEBRIDEMENT as a surgical intervention .  The patient's history has been reviewed, patient examined, no change in status, stable for surgery.  I have reviewed the patient's chart and labs.  Questions were answered to the patient's satisfaction.     Loanne Drilling

## 2012-10-03 NOTE — H&P (Signed)
  CC- Joe Davis is a 67 y.o. male who presents with right knee pain.  HPI- . Knee Pain: Patient presents with knee pain involving the  right knee. Onset of the symptoms was several months ago. Inciting event: none known. Current symptoms include pain located lateral and medial, stiffness and swelling. Pain is aggravated by going up and down stairs, lateral movements, pivoting, rising after sitting and squatting.  Patient has had no prior knee problems. Evaluation to date: MRI: abnormal medial/lateral meniscal tears. Treatment to date: corticosteroid injection which was not very effective.  Past Medical History  Diagnosis Date  . Hypercholesterolemia     ? statin intolerant. May tolerate Lipitor  . DVT (deep venous thrombosis) 1979  . Pulmonary embolism 1979right leg  . Basal cell carcinoma   . Syncope and collapse 8-10 yrs ago  . Bradycardia vasovagal response 10 yrs ago    asymptomatic  . BPH (benign prostatic hyperplasia)   . Normal nuclear stress test May 2011  . Erectile dysfunction   . Diastolic dysfunction     Per echo in 2008  . Obesity     Past Surgical History  Procedure Laterality Date  . Cardiovascular stress test  May 2011    EF 73%; No ischemia  . Moes procedure  6 yrs     nose  . Colonscopy  2013    Prior to Admission medications   Medication Sig Start Date End Date Taking? Authorizing Provider  aspirin 81 MG tablet Take 81 mg by mouth every morning.     Historical Provider, MD  fish oil-omega-3 fatty acids 1000 MG capsule Take by mouth daily.     Historical Provider, MD  ibuprofen (ADVIL,MOTRIN) 200 MG tablet Take 200 mg by mouth every 6 (six) hours as needed. For pain    Historical Provider, MD  OVER THE COUNTER MEDICATION Take 1 capsule by mouth 2 (two) times daily. OTC. CHOLESTOFF    Historical Provider, MD  tadalafil (CIALIS) 10 MG tablet Take 10 mg by mouth daily as needed. For erectile dysfunction    Historical Provider, MD   KNEE EXAM antalgic gait,  soft tissue tenderness over medial and lateral joint lines, negative drawer sign, collateral ligaments intact, negative Lachman sign, tender over right greater trochanter at right hip  Physical Examination: General appearance - alert, well appearing, and in no distress Mental status - alert, oriented to person, place, and time Chest - clear to auscultation, no wheezes, rales or rhonchi, symmetric air entry Heart - normal rate, regular rhythm, normal S1, S2, no murmurs, rubs, clicks or gallops Abdomen - soft, nontender, nondistended, no masses or organomegaly Neurological - alert, oriented, normal speech, no focal findings or movement disorder noted   Asessment/Plan--- Rightt knee medial meniscal tear- - Plan rightt knee arthroscopy with meniscal debridement. Procedure risks and potential comps discussed with patient who elects to proceed. Goals are decreased pain and increased function with a high likelihood of achieving both

## 2012-10-03 NOTE — Transfer of Care (Signed)
Immediate Anesthesia Transfer of Care Note  Patient: Joe Davis  Procedure(s) Performed: Procedure(s) with comments: RIGHT KNEE ARTHROSCOPY WITH DEBRIDEMENT (Right) - WITH DEBRIDEMENT  Patient Location: PACU  Anesthesia Type:General  Level of Consciousness: awake, alert , oriented and patient cooperative  Airway & Oxygen Therapy: Patient Spontanous Breathing and Patient connected to face mask oxygen  Post-op Assessment: Report given to PACU RN, Post -op Vital signs reviewed and stable and Patient moving all extremities X 4  Post vital signs: stable  Complications: No apparent anesthesia complications

## 2012-10-03 NOTE — Op Note (Signed)
Preoperative diagnosis-  Right knee medial and lateral meniscal tears  Postoperative diagnosis Right- knee medial and lateral meniscal tears   Plus Right medial and lateral chondral defects  Procedure- Right knee arthroscopy with medial and lateral Meniscal debridement and chondroplasty   Surgeon- Gus Rankin. Kirby Argueta, MD  Anesthesia-General  EBL-  Minimal  Complications- None  Condition- PACU - hemodynamically stable.  Brief clinical note- -Joe Davis is a 67 y.o.  male with a several month history of right knee pain and mechanical symptoms. Exam and history suggested medial meniscal tear confirmed by MRI. The patient presents now for arthroscopy and debridement   Procedure in detail -       After successful administration of General anesthetic, a tourmiquet is placed high on the Right  thigh and the Right lower extremity is prepped and draped in the usual sterile fashion. Time out is performed by the surgical team. Standard superomedial and inferolateral portal sites are marked and incisions made with an 11 blade. The inflow cannula is passed through the superomedial portal and camera through the inferolateral portal and inflow is initiated. Arthroscopic visualization proceeds.      The undersurface of the patella and trochlea are visualized and there is grade II chondromalacia patella but no unstable cartilage lesions. The medial and lateral gutters are visualized and there are  no loose bodies. Flexion and valgus force is applied to the knee and the medial compartment is entered. A spinal needle is passed into the joint through the site marked for the inferomedial portal. A small incision is made and the dilator passed into the joint. The findings for the medial compartment are tear of posterior horn of medial meniscus and 1 x 2 cm chondral defect medial femoral condyle . The tear is debrided to a stable base with baskets and a shaver and sealed off with the Arthrocare. The shaver is used  to debride the unstable cartilage to a stable cartilaginous base with stable edges. It is probed and found to be stable.    The intercondylar notch is visualized and the ACL appears normal. The lateral compartment is entered and the findings are chondral defect lateral tibial plateau and tear in body of lateral meniscus . The tear is debrided to a stable base with baskets and a shaver and sealed off with the Arthrocare. The shaver is used to debride the unstable cartilage to a stable cartilaginous base with stable edges. It is probed and found to be stable.     The joint is again inspected and there are no other tears, defects or loose bodies identified. The arthroscopic equipment is then removed from the inferior portals which are closed with interrupted 4-0 nylon. 20 ml of .25% Marcaine with epinephrine are injected through the inflow cannula and the cannula is then removed and the portal closed with nylon. The incisions are cleaned and dried and a bulky sterile dressing is applied. The patient is then awakened and transported to recovery in stable condition.   10/03/2012, 11:02 AM

## 2012-10-03 NOTE — Anesthesia Postprocedure Evaluation (Signed)
Anesthesia Post Note  Patient: Joe Davis  Procedure(s) Performed: Procedure(s) (LRB): RIGHT KNEE ARTHROSCOPY WITH DEBRIDEMENT (Right)  Anesthesia type: General  Patient location: PACU  Post pain: Pain level controlled  Post assessment: Post-op Vital signs reviewed  Last Vitals:  Filed Vitals:   10/03/12 1155  BP: 131/75  Pulse: 57  Temp: 36.4 C  Resp: 16    Post vital signs: Reviewed  Level of consciousness: sedated  Complications: No apparent anesthesia complications

## 2012-10-03 NOTE — Anesthesia Preprocedure Evaluation (Addendum)
Anesthesia Evaluation  Patient identified by MRN, date of birth, ID band Patient awake    Reviewed: Allergy & Precautions, H&P , NPO status , Patient's Chart, lab work & pertinent test results  Airway Mallampati: II TM Distance: >3 FB Neck ROM: Full    Dental  (+) Teeth Intact and Dental Advisory Given   Pulmonary neg pulmonary ROS, former smoker,  breath sounds clear to auscultation  Pulmonary exam normal       Cardiovascular negative cardio ROS  Rhythm:Regular Rate:Normal     Neuro/Psych negative neurological ROS  negative psych ROS   GI/Hepatic negative GI ROS, Neg liver ROS,   Endo/Other  negative endocrine ROS  Renal/GU negative Renal ROS  negative genitourinary   Musculoskeletal negative musculoskeletal ROS (+)   Abdominal   Peds  Hematology negative hematology ROS (+)   Anesthesia Other Findings   Reproductive/Obstetrics negative OB ROS                          Anesthesia Physical Anesthesia Plan  ASA: II  Anesthesia Plan: General   Post-op Pain Management:    Induction: Intravenous  Airway Management Planned: LMA  Additional Equipment:   Intra-op Plan:   Post-operative Plan:   Informed Consent:   Dental advisory given  Plan Discussed with: CRNA  Anesthesia Plan Comments:         Anesthesia Quick Evaluation

## 2012-10-04 ENCOUNTER — Encounter (HOSPITAL_COMMUNITY): Payer: Self-pay | Admitting: Orthopedic Surgery

## 2012-10-05 ENCOUNTER — Telehealth: Payer: Self-pay | Admitting: *Deleted

## 2012-10-05 DIAGNOSIS — E7800 Pure hypercholesterolemia, unspecified: Secondary | ICD-10-CM

## 2012-10-05 MED ORDER — ATORVASTATIN CALCIUM 10 MG PO TABS: ORAL_TABLET | ORAL | Status: DC

## 2012-10-05 NOTE — Telephone Encounter (Signed)
Discussed with  Dr. Patty Sermons and will try Lipitor 10 mg every other day.  Patient advised to call back if any problems, verbalized understanding.  Patient to have follow up labs in 2 months, scheduled

## 2012-10-05 NOTE — Telephone Encounter (Signed)
Message copied by Burnell Blanks on Fri Oct 05, 2012  5:29 PM ------      Message from: Cassell Clement      Created: Thu Oct 04, 2012  5:07 PM       Agree with plans to start a low level statin 3 times a week ------

## 2012-11-13 DIAGNOSIS — N401 Enlarged prostate with lower urinary tract symptoms: Secondary | ICD-10-CM | POA: Diagnosis not present

## 2012-12-03 ENCOUNTER — Telehealth: Payer: Self-pay | Admitting: Nurse Practitioner

## 2012-12-03 NOTE — Telephone Encounter (Signed)
New Prob     Pt states he needs a new prescription of LIPITOR. Prescription had expired by the time he got to it.

## 2012-12-04 ENCOUNTER — Other Ambulatory Visit: Payer: Self-pay | Admitting: *Deleted

## 2012-12-04 DIAGNOSIS — E7800 Pure hypercholesterolemia, unspecified: Secondary | ICD-10-CM

## 2012-12-04 MED ORDER — ATORVASTATIN CALCIUM 10 MG PO TABS: ORAL_TABLET | ORAL | Status: DC

## 2012-12-05 ENCOUNTER — Other Ambulatory Visit: Payer: Medicare Other

## 2013-01-08 DIAGNOSIS — M25569 Pain in unspecified knee: Secondary | ICD-10-CM | POA: Diagnosis not present

## 2013-01-10 ENCOUNTER — Encounter: Payer: Self-pay | Admitting: Family Medicine

## 2013-01-10 ENCOUNTER — Ambulatory Visit (INDEPENDENT_AMBULATORY_CARE_PROVIDER_SITE_OTHER): Payer: Medicare Other | Admitting: Family Medicine

## 2013-01-10 VITALS — BP 142/82 | Temp 98.3°F | Wt 227.0 lb

## 2013-01-10 DIAGNOSIS — R0600 Dyspnea, unspecified: Secondary | ICD-10-CM

## 2013-01-10 DIAGNOSIS — E7800 Pure hypercholesterolemia, unspecified: Secondary | ICD-10-CM | POA: Diagnosis not present

## 2013-01-10 DIAGNOSIS — R0989 Other specified symptoms and signs involving the circulatory and respiratory systems: Secondary | ICD-10-CM

## 2013-01-10 NOTE — Patient Instructions (Addendum)
Start regular exercise program.

## 2013-01-10 NOTE — Progress Notes (Signed)
  Subjective:    Patient ID: Joe Davis, male    DOB: Oct 12, 1945, 66 y.o.   MRN: [id number]  HPI  Seen with complaints of some chronic dyspnea with activity He's had fairly extensive workup per cardiology within the past year He had remote history of pulmonary embolus many years ago. CT chest back in 2013 no pulmonary embolus. Echocardiogram July 2013 ejection fraction 55-60%. No evidence for right-sided heart failure Mild diastolic dysfunction. Recent EKG no acute changes. Multiple recent labs including TSH normal Patient relates very little exercise. Gradual weight gain in several years. Smoked only briefly and quit in 1979. No chronic lung disease. No history of asthma. Denies recent cough or any other respiratory symptoms  Past Medical History  Diagnosis Date  . Hypercholesterolemia     ? statin intolerant. May tolerate Lipitor  . DVT (deep venous thrombosis) 1979  . Pulmonary embolism 1979right leg  . Basal cell carcinoma   . Syncope and collapse 8-10 yrs ago  . Bradycardia vasovagal response 10 yrs ago    asymptomatic  . BPH (benign prostatic hyperplasia)   . Normal nuclear stress test May 2011  . Erectile dysfunction   . Diastolic dysfunction     Per echo in 2008  . Obesity    Past Surgical History  Procedure Laterality Date  . Cardiovascular stress test  May 2011    EF 73%; No ischemia  . Moes procedure  6 yrs     nose  . Colonscopy  2013  . Knee arthroscopy Right 10/03/2012    Procedure: RIGHT KNEE ARTHROSCOPY WITH DEBRIDEMENT;  Surgeon: Loanne Drilling, MD;  Location: [facility];  Service: Orthopedics;  Laterality: Right;  WITH DEBRIDEMENT    reports that he quit smoking about 35 years ago. His smoking use included Cigarettes. He has a 10 pack-year smoking history. He has never used smokeless tobacco. He reports that he drinks about 0.6 ounces of alcohol per week. He reports that he does not use illicit drugs. family history is not on file. Allergies  Allergen  Reactions  . Crestor (Rosuvastatin Calcium)     LEG PAIN     Review of Systems  Constitutional: Positive for fatigue. Negative for appetite change and unexpected weight change.  Respiratory: Positive for shortness of breath. Negative for cough and wheezing.   Cardiovascular: Negative for chest pain, palpitations and leg swelling.  Gastrointestinal: Negative for abdominal pain.  Neurological: Negative for dizziness and weakness.  Hematological: Negative for adenopathy.       Objective:   Physical Exam  Constitutional: He appears well-developed and well-nourished.  Neck: Neck supple.  No carotid bruits  Cardiovascular: Normal rate and regular rhythm.  Exam reveals no gallop.   Pulmonary/Chest: Effort normal and breath sounds normal. No respiratory distress. He has no wheezes. He has no rales.  Musculoskeletal: He exhibits no edema.          Assessment & Plan:  Dyspnea. No chest pain. Symptoms occur with activity and suspect related to deconditioning. Recent echo and further evaluation as above negative. Pulse oximetry today is 97%. He is already exercising, though somewhat inconsistently. We've recommended he stepped up more regular aerobic exercise, lose some weight, and reassess in 2 months time. Consider repeat lipids then. He has not been taking Lipitor consistently

## 2013-01-14 DIAGNOSIS — M25569 Pain in unspecified knee: Secondary | ICD-10-CM | POA: Diagnosis not present

## 2013-01-16 DIAGNOSIS — R262 Difficulty in walking, not elsewhere classified: Secondary | ICD-10-CM | POA: Diagnosis not present

## 2013-01-22 DIAGNOSIS — M25569 Pain in unspecified knee: Secondary | ICD-10-CM | POA: Diagnosis not present

## 2013-01-24 DIAGNOSIS — R262 Difficulty in walking, not elsewhere classified: Secondary | ICD-10-CM | POA: Diagnosis not present

## 2013-02-13 DIAGNOSIS — M25569 Pain in unspecified knee: Secondary | ICD-10-CM | POA: Diagnosis not present

## 2013-02-22 DIAGNOSIS — IMO0002 Reserved for concepts with insufficient information to code with codable children: Secondary | ICD-10-CM | POA: Diagnosis not present

## 2013-02-22 DIAGNOSIS — M5137 Other intervertebral disc degeneration, lumbosacral region: Secondary | ICD-10-CM | POA: Diagnosis not present

## 2013-03-05 DIAGNOSIS — M169 Osteoarthritis of hip, unspecified: Secondary | ICD-10-CM | POA: Diagnosis not present

## 2013-03-12 ENCOUNTER — Ambulatory Visit: Payer: Medicare Other | Admitting: Family Medicine

## 2013-03-12 DIAGNOSIS — M25559 Pain in unspecified hip: Secondary | ICD-10-CM | POA: Diagnosis not present

## 2013-03-12 DIAGNOSIS — M169 Osteoarthritis of hip, unspecified: Secondary | ICD-10-CM | POA: Diagnosis not present

## 2013-04-02 ENCOUNTER — Ambulatory Visit (INDEPENDENT_AMBULATORY_CARE_PROVIDER_SITE_OTHER): Payer: Medicare Other | Admitting: Family Medicine

## 2013-04-02 ENCOUNTER — Encounter: Payer: Self-pay | Admitting: Family Medicine

## 2013-04-02 VITALS — BP 132/84 | HR 61 | Temp 97.4°F | Wt 221.0 lb

## 2013-04-02 DIAGNOSIS — E7800 Pure hypercholesterolemia, unspecified: Secondary | ICD-10-CM

## 2013-04-02 LAB — LIPID PANEL
LDL Cholesterol: 130 mg/dL — ABNORMAL HIGH (ref 0–99)
Total CHOL/HDL Ratio: 5
VLDL: 20.8 mg/dL (ref 0.0–40.0)

## 2013-04-02 LAB — HEPATIC FUNCTION PANEL
Bilirubin, Direct: 0.1 mg/dL (ref 0.0–0.3)
Total Bilirubin: 1 mg/dL (ref 0.3–1.2)

## 2013-04-02 NOTE — Progress Notes (Signed)
  Subjective:    Patient ID: Joe Davis, male    DOB: 1945/09/19, 67 y.o.   MRN: [id number]  HPI Patient is here requesting labs to recheck lipids. He has history of hyperlipidemia and was seen recently with some dyspnea which is somewhat chronic. Refer to prior note. Had fairly extensive cardiac workup in the past year unrevealing. He has started back swimming, currently 15 labs per day and is tolerating that well no chest pains or dyspnea. He has lost about 6 pounds over the past couple months which she treats exercise. He is very limited walking because of right hip pains. Saw orthopedics recently and has severe degenerative changes. Recent steroid injection has helped somewhat.  Patient's had question of myalgias previously with statin. He is currently not taking Lipitor or any other statin. He actually did not see much improvement in his musculoskeletal complaints when he stopped statin.  Past Medical History  Diagnosis Date  . Hypercholesterolemia     ? statin intolerant. May tolerate Lipitor  . DVT (deep venous thrombosis) 1979  . Pulmonary embolism 1979right leg  . Basal cell carcinoma   . Syncope and collapse 8-10 yrs ago  . Bradycardia vasovagal response 10 yrs ago    asymptomatic  . BPH (benign prostatic hyperplasia)   . Normal nuclear stress test May 2011  . Erectile dysfunction   . Diastolic dysfunction     Per echo in 2008  . Obesity    Past Surgical History  Procedure Laterality Date  . Cardiovascular stress test  May 2011    EF 73%; No ischemia  . Moes procedure  6 yrs     nose  . Colonscopy  2013  . Knee arthroscopy Right 10/03/2012    Procedure: RIGHT KNEE ARTHROSCOPY WITH DEBRIDEMENT;  Surgeon: Loanne Drilling, MD;  Location: [facility];  Service: Orthopedics;  Laterality: Right;  WITH DEBRIDEMENT    reports that he quit smoking about 35 years ago. His smoking use included Cigarettes. He has a 10 pack-year smoking history. He has never used smokeless  tobacco. He reports that he drinks about 0.6 ounces of alcohol per week. He reports that he does not use illicit drugs. family history is not on file. Allergies  Allergen Reactions  . Crestor [Rosuvastatin Calcium]     LEG PAIN      Review of Systems  Constitutional: Negative for fever, chills, fatigue and unexpected weight change.  Eyes: Negative for visual disturbance.  Respiratory: Negative for cough and chest tightness.   Cardiovascular: Negative for chest pain, palpitations and leg swelling.  Musculoskeletal: Positive for arthralgias.  Neurological: Negative for dizziness, syncope, weakness, light-headedness and headaches.       Objective:   Physical Exam  Constitutional: He appears well-developed and well-nourished.  Neck: Neck supple. No thyromegaly present.  Cardiovascular: Normal rate and regular rhythm.   Pulmonary/Chest: Effort normal and breath sounds normal. No respiratory distress. He has no wheezes. He has no rales.  Musculoskeletal: He exhibits no edema.  Lymphadenopathy:    He has no cervical adenopathy.          Assessment & Plan:  Hyperlipidemia. Recheck fasting lipid and hepatic panel. Dietary factors discussed

## 2013-05-29 DIAGNOSIS — N401 Enlarged prostate with lower urinary tract symptoms: Secondary | ICD-10-CM | POA: Diagnosis not present

## 2013-05-29 DIAGNOSIS — N529 Male erectile dysfunction, unspecified: Secondary | ICD-10-CM | POA: Diagnosis not present

## 2013-06-06 DIAGNOSIS — M25559 Pain in unspecified hip: Secondary | ICD-10-CM | POA: Diagnosis not present

## 2013-06-06 DIAGNOSIS — M169 Osteoarthritis of hip, unspecified: Secondary | ICD-10-CM | POA: Diagnosis not present

## 2013-06-11 DIAGNOSIS — H04129 Dry eye syndrome of unspecified lacrimal gland: Secondary | ICD-10-CM | POA: Diagnosis not present

## 2013-06-18 DIAGNOSIS — M169 Osteoarthritis of hip, unspecified: Secondary | ICD-10-CM | POA: Diagnosis not present

## 2013-07-02 DIAGNOSIS — Z23 Encounter for immunization: Secondary | ICD-10-CM | POA: Diagnosis not present

## 2013-09-17 ENCOUNTER — Telehealth: Payer: Self-pay | Admitting: Cardiology

## 2013-09-17 NOTE — Telephone Encounter (Signed)
Will forward to  Dr. Brackbill for review 

## 2013-09-17 NOTE — Telephone Encounter (Signed)
New message   Patient girl friend calling . Made appt for PA/ NPA on 2/4 with Danya Dunn     C/o irregular heart rate at night.

## 2013-09-18 ENCOUNTER — Ambulatory Visit (INDEPENDENT_AMBULATORY_CARE_PROVIDER_SITE_OTHER): Payer: Medicare Other | Admitting: Cardiology

## 2013-09-18 ENCOUNTER — Encounter: Payer: Self-pay | Admitting: Cardiology

## 2013-09-18 VITALS — BP 144/74 | HR 50 | Ht 70.0 in | Wt 234.0 lb

## 2013-09-18 DIAGNOSIS — E785 Hyperlipidemia, unspecified: Secondary | ICD-10-CM

## 2013-09-18 DIAGNOSIS — I519 Heart disease, unspecified: Secondary | ICD-10-CM

## 2013-09-18 DIAGNOSIS — I5189 Other ill-defined heart diseases: Secondary | ICD-10-CM

## 2013-09-18 DIAGNOSIS — I499 Cardiac arrhythmia, unspecified: Secondary | ICD-10-CM

## 2013-09-18 NOTE — Telephone Encounter (Signed)
Agree with plan 

## 2013-09-18 NOTE — Progress Notes (Signed)
Joe Davis Date of Birth:  09/18/1945 [address] [place],   [address] [phone]           Fax   [phone]  HPI: This pleasant 68 year old gentleman is seen for a work in office visit.  He comes in with his girlfriend.  She has noted that when the patient is asleep his heart rate drops low and there are a lot of skipped beats and missing beats.  She was concerned about this.  The patient himself is asleep and his underwear.  He does not have any irregular pulse when he checks it at various times during the waking hours..  He has a history of hypercholesterolemia.  He does not have any history of ischemic heart disease.  The patient had a LexiScan Cardiolite stress test on 01/04/10 which showed an ejection fraction of 73% and no evidence of myocardial ischemia.  The patient has a past history of mild cardiomegaly and a past history of deep vein thrombosis.  He had an echocardiogram on 01/28/8 which showed normal left ventricular systolic function and did show diastolic dysfunction as well as mild aortic sclerosis and mild pulmonary hypertension at 40.  The patient has not been expressing any chest pain.  [facility]  Current Outpatient Prescriptions  Medication Sig Dispense Refill  . aspirin 81 MG tablet Take 81 mg by mouth every morning.       . fish oil-omega-3 fatty acids 1000 MG capsule Take by mouth daily.       Marland Kitchen OVER THE COUNTER MEDICATION Take 1 capsule by mouth 2 (two) times daily. OTC. CHOLESTOFF      . tadalafil (CIALIS) 10 MG tablet Take 10 mg by mouth daily as needed. For erectile dysfunction       No current facility-administered medications for this visit.    Allergies  Allergen Reactions  . Crestor [Rosuvastatin [place]     LEG PAIN    Patient Active Problem List   Diagnosis Date Noted  . Hypercholesterolemia 01/26/2011    Priority: High  . Acute medial meniscal tear 10/03/2012  . Palpitation  05/03/2011  . Atypical chest pain 05/03/2011  . BACK PAIN 01/23/2007  . Pain in Soft Tissues of Limb 01/23/2007  . SKIN CANCER, HX OF 01/23/2007  . Personal History of Venous Thrombosis and Embolism 01/23/2007    History  Smoking status  . Former Smoker -- 1.00 packs/day for 10 years  . Types: Cigarettes  . Quit date: 08/15/1977  Smokeless tobacco  . Never Used    History  Alcohol Use  . 0.6 oz/week  . 1 Shots of liquor per week    Comment: daily use    No family history on file.  Review of Systems: The patient denies any heat or cold intolerance.  No weight gain or weight loss.  The patient denies headaches or blurry vision.  There is no cough or sputum production.  The patient denies dizziness.  There is no hematuria or hematochezia.    The patient denies any rash.  The patient denies frequent falling or instability.  There is no history of depression or anxiety.  All other systems were reviewed and are negative.   Physical Exam: Filed Vitals:   09/18/13 1306  BP: 144/74  Pulse: 50  The general appearance feels a well-developed well-nourished gentleman in no distress.The head and neck exam reveals pupils equal and reactive.  Extraocular movements  are full.  There is no scleral icterus.  The mouth and pharynx are normal.  The neck is supple.  The carotids reveal no bruits.  The jugular venous pressure is normal.  The  thyroid is not enlarged.  There is no lymphadenopathy.  The chest is clear to percussion and auscultation.  There are no rales or rhonchi.  Expansion of the chest is symmetrical.  The precordium is quiet.  The first heart sound is normal.  The second heart sound is physiologically split.  There is no murmur gallop rub or click.  There is no abnormal lift or heave.  The abdomen is soft and nontender.  The bowel sounds are normal.  The liver and spleen are not enlarged.  There are no abdominal masses.  There are no abdominal bruits.  Extremities reveal good pedal  pulses.  There is no phlebitis or edema.  There is no cyanosis or clubbing.  Strength is normal and symmetrical in all extremities.  There is no lateralizing weakness.  There are no sensory deficits.  The skin is warm and dry.  There is no rash.  EKG today shows sinus bradycardia at 52 per minute and is otherwise within normal limits.  The patient is not on any rate lowering medication.  Assessment / Plan:  We will have the patient return next week to get a 48 hour Holter monitor to try to determine what arrhythmia he is having during sleep.  When he returns for the Holter he will also come in fasting and we will get his fasting lipid panel hepatic function panel and basal metabolic panel

## 2013-09-18 NOTE — Patient Instructions (Signed)
Return soon for 48 hour monitor and fasting labs (lp/cmet/hfp)

## 2013-09-19 ENCOUNTER — Ambulatory Visit: Payer: Medicare Other | Admitting: Physician Assistant

## 2013-10-03 ENCOUNTER — Ambulatory Visit (INDEPENDENT_AMBULATORY_CARE_PROVIDER_SITE_OTHER): Payer: Medicare Other | Admitting: Family Medicine

## 2013-10-03 ENCOUNTER — Encounter: Payer: Self-pay | Admitting: Family Medicine

## 2013-10-03 ENCOUNTER — Ambulatory Visit: Payer: Medicare Other | Admitting: Family Medicine

## 2013-10-03 VITALS — BP 134/78 | HR 55 | Temp 97.8°F | Wt 229.0 lb

## 2013-10-03 DIAGNOSIS — M109 Gout, unspecified: Secondary | ICD-10-CM | POA: Insufficient documentation

## 2013-10-03 DIAGNOSIS — E7800 Pure hypercholesterolemia, unspecified: Secondary | ICD-10-CM

## 2013-10-03 LAB — LIPID PANEL
CHOLESTEROL: 181 mg/dL (ref 0–200)
HDL: 43.5 mg/dL (ref >39.00)
LDL Cholesterol: 119 mg/dL — ABNORMAL HIGH (ref 0–99)
Total CHOL/HDL Ratio: 4
Triglycerides: 94 mg/dL (ref 0.0–149.0)
VLDL: 18.8 mg/dL (ref 0.0–40.0)

## 2013-10-03 MED ORDER — INDOMETHACIN 50 MG PO CAPS: 50.0000 mg | ORAL_CAPSULE | Freq: Three times a day (TID) | ORAL | Status: DC | PRN

## 2013-10-03 NOTE — Progress Notes (Signed)
   Subjective:    Patient ID: Joe Davis, male    DOB: [birth year], 68 y.o.   MRN: [id number]  HPI Here with multiple issues:  Hyperlipidemia. Patient is requesting repeat lipids today. Recent LDL 130. He has previously taken statin and currently not on medication. Nonsmoker. No family history of premature CAD. He does not have any cardiac history. He is undecided currently regarding statin therapy like to reassess lipids first  Osteoarthritis right hip. Currently followed at [facility]. Recent steroid injection which has helped  Recent palpitations intermittently. No chest pain. Recent EKG unremarkable. Holter monitor pending. He is seeing cardiology regarding this. No syncope. No dizziness.  Right metatarsophalangeal joint pain which started acutely yesterday. Reported history of gout. He noted mild redness. No fever. No chills. No injury. No alleviating medications.  Past Medical History  Diagnosis Date  . Hypercholesterolemia     ? statin intolerant. May tolerate Lipitor  . DVT (deep venous thrombosis) 1979  . Pulmonary embolism 1979right leg  . Basal cell carcinoma   . Syncope and collapse 8-10 yrs ago  . Bradycardia vasovagal response 10 yrs ago    asymptomatic  . BPH (benign prostatic hyperplasia)   . Normal nuclear stress test May 2011  . Erectile dysfunction   . Diastolic dysfunction     Per echo in 2008  . Obesity    Past Surgical History  Procedure Laterality Date  . Cardiovascular stress test  May 2011    EF 73%; No ischemia  . Moes procedure  6 yrs     nose  . Colonscopy  2013  . Knee arthroscopy Right 10/03/2012    Procedure: RIGHT KNEE ARTHROSCOPY WITH DEBRIDEMENT;  Surgeon: [name], MD;  Location: [facility];  Service: Orthopedics;  Laterality: Right;  WITH DEBRIDEMENT    reports that he quit smoking about 36 years ago. His smoking use included Cigarettes. He has a 10 pack-year smoking history. He has never used smokeless tobacco. He reports that he  drinks about 0.6 ounces of alcohol per week. He reports that he does not use illicit drugs. family history is not on file. Allergies  Allergen Reactions  . Crestor [Rosuvastatin Calcium]     LEG PAIN      Review of Systems  Constitutional: Negative for fatigue.  Eyes: Negative for visual disturbance.  Respiratory: Negative for cough, chest tightness and shortness of breath.   Cardiovascular: Positive for palpitations. Negative for chest pain and leg swelling.  Musculoskeletal: Positive for arthralgias (Right MTP joint).  Neurological: Negative for dizziness, syncope, weakness, light-headedness and headaches.       Objective:   Physical Exam  Constitutional: He appears well-developed and well-nourished.  Cardiovascular: Normal rate and regular rhythm.   Pulmonary/Chest: Effort normal and breath sounds normal. No respiratory distress. He has no wheezes. He has no rales.  Musculoskeletal: He exhibits no edema.  Right foot reveals minimal tenderness right MTP joint. Minimal if any warmth. Very minimal erythema.          Assessment & Plan:  #1 hyperlipidemia. Repeat lipids. Patient undecided regarding statin therapy. Obtain labs and calculate 10 year risk of CAD and make recommendations from there #2 probable acute gout right MTP joint. Indomethacin 50 mg every 8 hours as needed and take with food #3 osteoarthritis right hip. Followed at [facility] and stable #4 recent palpitations. Holter monitor pending.

## 2013-10-03 NOTE — Progress Notes (Signed)
Pre visit review using our clinic review tool, if applicable. No additional management support is needed unless otherwise documented below in the visit note. 

## 2013-10-03 NOTE — Patient Instructions (Signed)

## 2013-10-15 ENCOUNTER — Other Ambulatory Visit: Payer: Self-pay

## 2013-10-15 ENCOUNTER — Other Ambulatory Visit: Payer: Self-pay | Admitting: Family Medicine

## 2013-10-15 DIAGNOSIS — E7800 Pure hypercholesterolemia, unspecified: Secondary | ICD-10-CM

## 2013-10-15 MED ORDER — ATORVASTATIN CALCIUM 10 MG PO TABS: 10.0000 mg | ORAL_TABLET | Freq: Every day | ORAL | Status: DC

## 2013-12-11 DIAGNOSIS — M1610 Unilateral primary osteoarthritis, unspecified hip: Secondary | ICD-10-CM | POA: Diagnosis not present

## 2013-12-11 DIAGNOSIS — M169 Osteoarthritis of hip, unspecified: Secondary | ICD-10-CM | POA: Diagnosis not present

## 2014-04-11 DIAGNOSIS — M25559 Pain in unspecified hip: Secondary | ICD-10-CM | POA: Diagnosis not present

## 2014-04-11 DIAGNOSIS — M1610 Unilateral primary osteoarthritis, unspecified hip: Secondary | ICD-10-CM | POA: Diagnosis not present

## 2014-04-11 DIAGNOSIS — M259 Joint disorder, unspecified: Secondary | ICD-10-CM | POA: Diagnosis not present

## 2014-04-23 DIAGNOSIS — M1610 Unilateral primary osteoarthritis, unspecified hip: Secondary | ICD-10-CM | POA: Diagnosis not present

## 2014-04-23 DIAGNOSIS — M169 Osteoarthritis of hip, unspecified: Secondary | ICD-10-CM | POA: Diagnosis not present

## 2014-05-23 DIAGNOSIS — I8290 Acute embolism and thrombosis of unspecified vein: Secondary | ICD-10-CM | POA: Diagnosis not present

## 2014-05-27 DIAGNOSIS — H25819 Combined forms of age-related cataract, unspecified eye: Secondary | ICD-10-CM | POA: Diagnosis not present

## 2014-06-03 DIAGNOSIS — M1611 Unilateral primary osteoarthritis, right hip: Secondary | ICD-10-CM | POA: Diagnosis not present

## 2014-06-03 DIAGNOSIS — M25461 Effusion, right knee: Secondary | ICD-10-CM | POA: Diagnosis not present

## 2014-06-03 DIAGNOSIS — M25561 Pain in right knee: Secondary | ICD-10-CM | POA: Diagnosis not present

## 2014-06-04 DIAGNOSIS — N401 Enlarged prostate with lower urinary tract symptoms: Secondary | ICD-10-CM | POA: Diagnosis not present

## 2014-06-04 DIAGNOSIS — N5201 Erectile dysfunction due to arterial insufficiency: Secondary | ICD-10-CM | POA: Diagnosis not present

## 2014-06-04 DIAGNOSIS — R350 Frequency of micturition: Secondary | ICD-10-CM | POA: Diagnosis not present

## 2014-06-23 DIAGNOSIS — L821 Other seborrheic keratosis: Secondary | ICD-10-CM | POA: Diagnosis not present

## 2014-06-23 DIAGNOSIS — L570 Actinic keratosis: Secondary | ICD-10-CM | POA: Diagnosis not present

## 2014-06-23 DIAGNOSIS — T148 Other injury of unspecified body region: Secondary | ICD-10-CM | POA: Diagnosis not present

## 2014-06-23 DIAGNOSIS — Z85828 Personal history of other malignant neoplasm of skin: Secondary | ICD-10-CM | POA: Diagnosis not present

## 2014-06-25 DIAGNOSIS — M109 Gout, unspecified: Secondary | ICD-10-CM | POA: Diagnosis not present

## 2014-07-09 DIAGNOSIS — M1611 Unilateral primary osteoarthritis, right hip: Secondary | ICD-10-CM | POA: Diagnosis not present

## 2014-07-16 DIAGNOSIS — M25561 Pain in right knee: Secondary | ICD-10-CM | POA: Diagnosis not present

## 2014-07-16 DIAGNOSIS — G8929 Other chronic pain: Secondary | ICD-10-CM | POA: Diagnosis not present

## 2014-07-16 DIAGNOSIS — M1611 Unilateral primary osteoarthritis, right hip: Secondary | ICD-10-CM | POA: Diagnosis not present

## 2014-07-28 ENCOUNTER — Encounter: Payer: Self-pay | Admitting: Cardiology

## 2014-07-31 DIAGNOSIS — M25561 Pain in right knee: Secondary | ICD-10-CM | POA: Diagnosis not present

## 2014-07-31 DIAGNOSIS — M1611 Unilateral primary osteoarthritis, right hip: Secondary | ICD-10-CM | POA: Diagnosis not present

## 2014-07-31 DIAGNOSIS — M25551 Pain in right hip: Secondary | ICD-10-CM | POA: Diagnosis not present

## 2014-08-12 DIAGNOSIS — M25561 Pain in right knee: Secondary | ICD-10-CM | POA: Diagnosis not present

## 2014-08-20 DIAGNOSIS — M2241 Chondromalacia patellae, right knee: Secondary | ICD-10-CM | POA: Diagnosis not present

## 2014-08-20 DIAGNOSIS — S83281D Other tear of lateral meniscus, current injury, right knee, subsequent encounter: Secondary | ICD-10-CM | POA: Diagnosis not present

## 2014-08-20 DIAGNOSIS — M25561 Pain in right knee: Secondary | ICD-10-CM | POA: Diagnosis not present

## 2014-08-20 DIAGNOSIS — M1611 Unilateral primary osteoarthritis, right hip: Secondary | ICD-10-CM | POA: Diagnosis not present

## 2014-09-10 DIAGNOSIS — M7989 Other specified soft tissue disorders: Secondary | ICD-10-CM | POA: Diagnosis not present

## 2014-09-10 DIAGNOSIS — Z01818 Encounter for other preprocedural examination: Secondary | ICD-10-CM | POA: Diagnosis not present

## 2014-09-10 DIAGNOSIS — Z86718 Personal history of other venous thrombosis and embolism: Secondary | ICD-10-CM | POA: Diagnosis not present

## 2014-09-10 DIAGNOSIS — M109 Gout, unspecified: Secondary | ICD-10-CM | POA: Diagnosis not present

## 2014-09-10 DIAGNOSIS — E780 Pure hypercholesterolemia: Secondary | ICD-10-CM | POA: Diagnosis not present

## 2014-09-10 DIAGNOSIS — R001 Bradycardia, unspecified: Secondary | ICD-10-CM | POA: Diagnosis not present

## 2014-09-10 DIAGNOSIS — R002 Palpitations: Secondary | ICD-10-CM | POA: Diagnosis not present

## 2014-09-10 DIAGNOSIS — M1611 Unilateral primary osteoarthritis, right hip: Secondary | ICD-10-CM | POA: Diagnosis not present

## 2014-09-10 DIAGNOSIS — I10 Essential (primary) hypertension: Secondary | ICD-10-CM | POA: Diagnosis not present

## 2014-09-10 DIAGNOSIS — M79604 Pain in right leg: Secondary | ICD-10-CM | POA: Diagnosis not present

## 2014-09-18 ENCOUNTER — Encounter: Payer: Self-pay | Admitting: Cardiology

## 2014-09-18 ENCOUNTER — Ambulatory Visit (INDEPENDENT_AMBULATORY_CARE_PROVIDER_SITE_OTHER): Payer: Medicare Other | Admitting: Cardiology

## 2014-09-18 ENCOUNTER — Ambulatory Visit: Payer: Medicare Other | Admitting: Cardiology

## 2014-09-18 VITALS — BP 138/80 | HR 57 | Ht 70.0 in | Wt 227.0 lb

## 2014-09-18 DIAGNOSIS — R011 Cardiac murmur, unspecified: Secondary | ICD-10-CM | POA: Diagnosis not present

## 2014-09-18 DIAGNOSIS — D225 Melanocytic nevi of trunk: Secondary | ICD-10-CM | POA: Diagnosis not present

## 2014-09-18 DIAGNOSIS — E7800 Pure hypercholesterolemia, unspecified: Secondary | ICD-10-CM

## 2014-09-18 DIAGNOSIS — I5189 Other ill-defined heart diseases: Secondary | ICD-10-CM

## 2014-09-18 DIAGNOSIS — I519 Heart disease, unspecified: Secondary | ICD-10-CM | POA: Diagnosis not present

## 2014-09-18 DIAGNOSIS — L570 Actinic keratosis: Secondary | ICD-10-CM | POA: Diagnosis not present

## 2014-09-18 DIAGNOSIS — L821 Other seborrheic keratosis: Secondary | ICD-10-CM | POA: Diagnosis not present

## 2014-09-18 DIAGNOSIS — Z85828 Personal history of other malignant neoplasm of skin: Secondary | ICD-10-CM | POA: Diagnosis not present

## 2014-09-18 DIAGNOSIS — L940 Localized scleroderma [morphea]: Secondary | ICD-10-CM | POA: Diagnosis not present

## 2014-09-18 NOTE — Patient Instructions (Signed)
Your physician recommends that you continue on your current medications as directed. Please refer to the Current Medication list given to you today.  Your physician has requested that you have an echocardiogram. Echocardiography is a painless test that uses sound waves to create images of your heart. It provides your doctor with information about the size and shape of your heart and how well your heart's chambers and valves are working. This procedure takes approximately one hour. There are no restrictions for this procedure.  Your physician wants you to follow-up in: 6 months You will receive a reminder letter in the mail two months in advance. If you don't receive a letter, please call our office to schedule the follow-up appointment.   Fasting blood work  (lp/bmet/hfp)  Next week

## 2014-09-18 NOTE — Progress Notes (Signed)
Cardiology Office Note   Date:  09/18/2014   ID:  Joe Davis, DOB [birth year], MRN [id number]  PCP:  [name], MD  Cardiologist:   Darlin Coco, MD   No chief complaint on file.     History of Present Illness: Joe Davis is a 69 y.o. male who presents for follow-up office visit  This 69 year old gentleman has a history of diastolic left ventricular dysfunction.  He has a remote history of DVT with pulmonary embolus.  He has a history of hypercholesterolemia and atypical chest pain.  His last echocardiogram was 03/14/12 which showed normal left ventricular systolic function with ejection fraction 55-60%.  There was no evidence of right heart dysfunction.  There was slight thickening of the aortic valve but no mention of aortic valve stenosis. Since we last saw the patient he has had a lot of problems with his right hip.  He is scheduled for a total right hip replacement at [facility] on February 22.  He has not been able to exercise because of his hip pain.  He has not been having any chest pain.  He has a history of hypercholesterolemia but has not been taking his Lipitor. He has a history of chronic swelling of the right or leg and wears a support hose.  Past Medical History  Diagnosis Date  . Hypercholesterolemia     ? statin intolerant. May tolerate Lipitor  . DVT (deep venous thrombosis) 1979  . Pulmonary embolism 1979right leg  . Basal cell carcinoma   . Syncope and collapse 8-10 yrs ago  . Bradycardia vasovagal response 10 yrs ago    asymptomatic  . BPH (benign prostatic hyperplasia)   . Normal nuclear stress test May 2011  . Erectile dysfunction   . Diastolic dysfunction     Per echo in 2008  . Obesity     Past Surgical History  Procedure Laterality Date  . Cardiovascular stress test  May 2011    EF 73%; No ischemia  . Moes procedure  6 yrs     nose  . Colonscopy  2013  . Knee arthroscopy Right 10/03/2012    Procedure: RIGHT KNEE ARTHROSCOPY WITH  DEBRIDEMENT;  Surgeon: [name], MD;  Location: [facility];  Service: Orthopedics;  Laterality: Right;  WITH DEBRIDEMENT     Current Outpatient Prescriptions  Medication Sig Dispense Refill  . aspirin 81 MG tablet Take 81 mg by mouth every morning.     . fish oil-omega-3 fatty acids 1000 MG capsule Take by mouth daily.     Marland Kitchen ibuprofen (ADVIL,MOTRIN) 100 MG tablet Take by mouth as needed for pain.     Marland Kitchen OVER THE COUNTER MEDICATION Take 1 capsule by mouth 2 (two) times daily. OTC. CHOLESTOFF    . PLANT STANOL ESTER PO Take by mouth.    . tadalafil (CIALIS) 10 MG tablet Take 10 mg by mouth daily as needed. For erectile dysfunction     No current facility-administered medications for this visit.    Allergies:   Crestor    Social History:  The patient  reports that he quit smoking about 37 years ago. His smoking use included Cigarettes. He has a 10 pack-year smoking history. He has never used smokeless tobacco. He reports that he drinks about 0.6 oz of alcohol per week. He reports that he does not use illicit drugs.   Family History:  The patient's family history is notable for longevity in both his mother and his father.  However these were his adopted parents.  He is not aware of the health of his biological parents.   ROS:  Please see the history of present illness.   Otherwise, review of systems are positive for none.   All other systems are reviewed and negative.    PHYSICAL EXAM: VS:  BP 138/80 mmHg  Pulse 57  Ht 5\' 10"  (1.778 m)  Wt 227 lb (102.967 kg)  BMI 32.57 kg/m2 , BMI Body mass index is 32.57 kg/(m^2). GEN: Well nourished, well developed, in no acute distress HEENT: normal Neck: no JVD, carotid bruits, or masses Cardiac: RRR; no , rubs, or gallops,no edema and there is a grade 2/6 systolic ejection murmur at the base. Respiratory:  clear to auscultation bilaterally, normal work of breathing GI: soft, nontender, nondistended, + BS MS: no deformity or atrophy.  The  right calf is chronically swollen and he is wearing a support stocking on the right lower leg.  Good pedal pulses bilaterally. Skin: warm and dry, no rash Neuro:  Strength and sensation are intact Psych: euthymic mood, full affect   EKG:  EKG is ordered today. The ekg ordered today demonstrates sinus bradycardia and left axis deviation.  Since prior tracings, of 09/18/13, no significant change.   Recent Labs: No results found for requested labs within last 365 days.    Lipid Panel    Component Value Date/Time   CHOL 181 10/03/2013 1010   TRIG 94.0 10/03/2013 1010   HDL 43.50 10/03/2013 1010   CHOLHDL 4 10/03/2013 1010   VLDL 18.8 10/03/2013 1010   LDLCALC 119* 10/03/2013 1010   LDLDIRECT 125.7 09/25/2012 0914      Wt Readings from Last 3 Encounters:  09/18/14 227 lb (102.967 kg)  10/03/13 229 lb (103.874 kg)  09/18/13 234 lb (106.142 kg)      Other studies Reviewed:    ASSESSMENT AND PLAN:  1.  History of diastolic dysfunction 2.  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  History of cholesterolemia.  Presently not taking statin therapy. 4.  Osteoarthritis of right hip, scheduled for surgery at [facility] 10/06/14   Current medicines are reviewed at length with the patient today.  The patient does not have concerns regarding medicines.  The following changes have been made:  no change  Labs/ tests ordered today include: The patient is not fasting today.  He will return next week for fasting lab work.  We will also get a two-dimensional echocardiogram.     Orders Placed This Encounter  Procedures  . EKG 12-Lead  . 2D Echocardiogram without contrast   At this point the patient is provisionally cleared for surgery.  We will await the results of his echocardiogram.  Disposition:   FU with Dr. Mare Ferrari in 6 months   Signed, Darlin Coco, MD  09/18/2014 2:29 PM    [address], [place], [place]   [address] Phone: [phone]; Fax: [phone]

## 2014-09-24 ENCOUNTER — Other Ambulatory Visit (HOSPITAL_COMMUNITY): Payer: Medicare Other

## 2014-09-24 ENCOUNTER — Other Ambulatory Visit: Payer: Medicare Other

## 2014-09-24 DIAGNOSIS — M2241 Chondromalacia patellae, right knee: Secondary | ICD-10-CM | POA: Diagnosis not present

## 2014-09-24 DIAGNOSIS — S83281D Other tear of lateral meniscus, current injury, right knee, subsequent encounter: Secondary | ICD-10-CM | POA: Diagnosis not present

## 2014-09-24 DIAGNOSIS — M1611 Unilateral primary osteoarthritis, right hip: Secondary | ICD-10-CM | POA: Diagnosis not present

## 2014-09-24 DIAGNOSIS — M2391 Unspecified internal derangement of right knee: Secondary | ICD-10-CM | POA: Diagnosis not present

## 2014-09-26 ENCOUNTER — Ambulatory Visit (HOSPITAL_COMMUNITY): Payer: Medicare Other | Attending: Cardiology | Admitting: Radiology

## 2014-09-26 ENCOUNTER — Other Ambulatory Visit (INDEPENDENT_AMBULATORY_CARE_PROVIDER_SITE_OTHER): Payer: Medicare Other | Admitting: *Deleted

## 2014-09-26 DIAGNOSIS — E7800 Pure hypercholesterolemia, unspecified: Secondary | ICD-10-CM

## 2014-09-26 DIAGNOSIS — R011 Cardiac murmur, unspecified: Secondary | ICD-10-CM

## 2014-09-26 DIAGNOSIS — I519 Heart disease, unspecified: Secondary | ICD-10-CM | POA: Diagnosis not present

## 2014-09-26 DIAGNOSIS — I5189 Other ill-defined heart diseases: Secondary | ICD-10-CM

## 2014-09-26 LAB — HEPATIC FUNCTION PANEL
ALK PHOS: 61 U/L (ref 39–117)
ALT: 22 U/L (ref 0–53)
AST: 24 U/L (ref 0–37)
Albumin: 4.1 g/dL (ref 3.5–5.2)
Bilirubin, Direct: 0.1 mg/dL (ref 0.0–0.3)
Total Bilirubin: 0.6 mg/dL (ref 0.2–1.2)
Total Protein: 7.1 g/dL (ref 6.0–8.3)

## 2014-09-26 LAB — LIPID PANEL
Cholesterol: 187 mg/dL (ref 0–200)
HDL: 43.8 mg/dL (ref >39.00)
LDL CALC: 122 mg/dL — AB (ref 0–99)
NONHDL: 143.2
Total CHOL/HDL Ratio: 4
Triglycerides: 107 mg/dL (ref 0.0–149.0)
VLDL: 21.4 mg/dL (ref 0.0–40.0)

## 2014-09-26 LAB — BASIC METABOLIC PANEL
BUN: 19 mg/dL (ref 6–23)
CALCIUM: 9.2 mg/dL (ref 8.4–10.5)
CHLORIDE: 106 meq/L (ref 96–112)
CO2: 30 meq/L (ref 19–32)
CREATININE: 0.84 mg/dL (ref 0.40–1.50)
GFR: 95.8 mL/min (ref >60.00)
Glucose, Bld: 107 mg/dL — ABNORMAL HIGH (ref 70–99)
Potassium: 4.7 mEq/L (ref 3.5–5.1)
SODIUM: 139 meq/L (ref 135–145)

## 2014-09-26 NOTE — Progress Notes (Signed)
Quick Note:  Please report to patient. The recent labs are stable. Continue same medication and careful diet. Blood sugar slightly high. LDL slightly high. Work harder on diet and weight loss. Liver okay. ______

## 2014-09-26 NOTE — Progress Notes (Signed)
Echocardiogram performed.  

## 2014-10-02 ENCOUNTER — Telehealth: Payer: Self-pay | Admitting: Cardiology

## 2014-10-02 NOTE — Telephone Encounter (Signed)
Echo reviewed by  Dr. Mare Ferrari and stable Left message regarding echo and lab Mailed copy of labs and left message to call if any questions

## 2014-10-02 NOTE — Telephone Encounter (Signed)
-----   Message from Darlin Coco, MD sent at 09/26/2014  6:43 PM EST ----- Please report to patient.  The recent labs are stable. Continue same medication and careful diet. Blood sugar slightly high. LDL slightly high.  Work harder on diet and weight loss. Liver okay.

## 2014-10-02 NOTE — Telephone Encounter (Signed)
New message     Calling to get lab/echo results.  OK to leave msg on home phone or call cell phone.

## 2014-10-06 DIAGNOSIS — Z471 Aftercare following joint replacement surgery: Secondary | ICD-10-CM | POA: Diagnosis not present

## 2014-10-06 DIAGNOSIS — Z86718 Personal history of other venous thrombosis and embolism: Secondary | ICD-10-CM | POA: Diagnosis not present

## 2014-10-06 DIAGNOSIS — M25561 Pain in right knee: Secondary | ICD-10-CM | POA: Diagnosis present

## 2014-10-06 DIAGNOSIS — Z86711 Personal history of pulmonary embolism: Secondary | ICD-10-CM | POA: Diagnosis not present

## 2014-10-06 DIAGNOSIS — Z87891 Personal history of nicotine dependence: Secondary | ICD-10-CM | POA: Diagnosis not present

## 2014-10-06 DIAGNOSIS — R001 Bradycardia, unspecified: Secondary | ICD-10-CM | POA: Diagnosis present

## 2014-10-06 DIAGNOSIS — M549 Dorsalgia, unspecified: Secondary | ICD-10-CM | POA: Diagnosis present

## 2014-10-06 DIAGNOSIS — E780 Pure hypercholesterolemia: Secondary | ICD-10-CM | POA: Diagnosis not present

## 2014-10-06 DIAGNOSIS — M109 Gout, unspecified: Secondary | ICD-10-CM | POA: Diagnosis present

## 2014-10-06 DIAGNOSIS — R079 Chest pain, unspecified: Secondary | ICD-10-CM | POA: Diagnosis present

## 2014-10-06 DIAGNOSIS — Z96641 Presence of right artificial hip joint: Secondary | ICD-10-CM | POA: Diagnosis not present

## 2014-10-06 DIAGNOSIS — M1611 Unilateral primary osteoarthritis, right hip: Secondary | ICD-10-CM | POA: Diagnosis not present

## 2014-10-06 DIAGNOSIS — R002 Palpitations: Secondary | ICD-10-CM | POA: Diagnosis present

## 2014-10-06 DIAGNOSIS — Z85828 Personal history of other malignant neoplasm of skin: Secondary | ICD-10-CM | POA: Diagnosis not present

## 2014-10-06 DIAGNOSIS — Z23 Encounter for immunization: Secondary | ICD-10-CM | POA: Diagnosis not present

## 2014-10-06 HISTORY — PX: TOTAL HIP ARTHROPLASTY: SHX124

## 2014-10-09 DIAGNOSIS — Z471 Aftercare following joint replacement surgery: Secondary | ICD-10-CM | POA: Diagnosis not present

## 2014-10-09 DIAGNOSIS — M109 Gout, unspecified: Secondary | ICD-10-CM | POA: Diagnosis not present

## 2014-10-09 DIAGNOSIS — M6281 Muscle weakness (generalized): Secondary | ICD-10-CM | POA: Diagnosis not present

## 2014-10-09 DIAGNOSIS — Z96641 Presence of right artificial hip joint: Secondary | ICD-10-CM | POA: Diagnosis not present

## 2014-10-09 DIAGNOSIS — I10 Essential (primary) hypertension: Secondary | ICD-10-CM | POA: Diagnosis not present

## 2014-10-09 DIAGNOSIS — R2689 Other abnormalities of gait and mobility: Secondary | ICD-10-CM | POA: Diagnosis not present

## 2014-10-10 ENCOUNTER — Encounter (HOSPITAL_COMMUNITY): Payer: Self-pay

## 2014-10-10 ENCOUNTER — Observation Stay (HOSPITAL_COMMUNITY)
Admission: EM | Admit: 2014-10-10 | Discharge: 2014-10-11 | Disposition: A | Discharge status: Home / Self Care | Payer: Medicare Other | Attending: Internal Medicine | Admitting: Internal Medicine

## 2014-10-10 ENCOUNTER — Emergency Department (HOSPITAL_COMMUNITY): Payer: Medicare Other

## 2014-10-10 DIAGNOSIS — N529 Male erectile dysfunction, unspecified: Secondary | ICD-10-CM | POA: Insufficient documentation

## 2014-10-10 DIAGNOSIS — Z85828 Personal history of other malignant neoplasm of skin: Secondary | ICD-10-CM | POA: Diagnosis not present

## 2014-10-10 DIAGNOSIS — Z86711 Personal history of pulmonary embolism: Secondary | ICD-10-CM | POA: Diagnosis not present

## 2014-10-10 DIAGNOSIS — Z87891 Personal history of nicotine dependence: Secondary | ICD-10-CM | POA: Insufficient documentation

## 2014-10-10 DIAGNOSIS — I5189 Other ill-defined heart diseases: Secondary | ICD-10-CM | POA: Diagnosis present

## 2014-10-10 DIAGNOSIS — Z86718 Personal history of other venous thrombosis and embolism: Secondary | ICD-10-CM | POA: Insufficient documentation

## 2014-10-10 DIAGNOSIS — R404 Transient alteration of awareness: Secondary | ICD-10-CM | POA: Diagnosis not present

## 2014-10-10 DIAGNOSIS — E7800 Pure hypercholesterolemia, unspecified: Secondary | ICD-10-CM | POA: Diagnosis present

## 2014-10-10 DIAGNOSIS — E669 Obesity, unspecified: Secondary | ICD-10-CM | POA: Diagnosis not present

## 2014-10-10 DIAGNOSIS — I519 Heart disease, unspecified: Secondary | ICD-10-CM | POA: Diagnosis not present

## 2014-10-10 DIAGNOSIS — Z96641 Presence of right artificial hip joint: Secondary | ICD-10-CM | POA: Diagnosis not present

## 2014-10-10 DIAGNOSIS — E7849 Other hyperlipidemia: Secondary | ICD-10-CM | POA: Diagnosis present

## 2014-10-10 DIAGNOSIS — Z7982 Long term (current) use of aspirin: Secondary | ICD-10-CM | POA: Diagnosis not present

## 2014-10-10 DIAGNOSIS — Z79899 Other long term (current) drug therapy: Secondary | ICD-10-CM | POA: Insufficient documentation

## 2014-10-10 DIAGNOSIS — N400 Enlarged prostate without lower urinary tract symptoms: Secondary | ICD-10-CM | POA: Insufficient documentation

## 2014-10-10 DIAGNOSIS — Z9889 Other specified postprocedural states: Secondary | ICD-10-CM

## 2014-10-10 DIAGNOSIS — Z471 Aftercare following joint replacement surgery: Secondary | ICD-10-CM | POA: Diagnosis not present

## 2014-10-10 DIAGNOSIS — M1990 Unspecified osteoarthritis, unspecified site: Secondary | ICD-10-CM | POA: Insufficient documentation

## 2014-10-10 DIAGNOSIS — R55 Syncope and collapse: Principal | ICD-10-CM | POA: Insufficient documentation

## 2014-10-10 HISTORY — DX: Unspecified osteoarthritis, unspecified site: M19.90

## 2014-10-10 HISTORY — DX: Personal history of other diseases of the musculoskeletal system and connective tissue: Z87.39

## 2014-10-10 LAB — COMPREHENSIVE METABOLIC PANEL
ALK PHOS: 48 U/L (ref 39–117)
ALT: 18 U/L (ref 0–53)
ANION GAP: 6 (ref 5–15)
AST: 29 U/L (ref 0–37)
Albumin: 3 g/dL — ABNORMAL LOW (ref 3.5–5.2)
BILIRUBIN TOTAL: 1 mg/dL (ref 0.3–1.2)
BUN: 16 mg/dL (ref 6–23)
CALCIUM: 8.2 mg/dL — AB (ref 8.4–10.5)
CHLORIDE: 103 mmol/L (ref 96–112)
CO2: 27 mmol/L (ref 19–32)
Creatinine, Ser: 0.86 mg/dL (ref 0.50–1.35)
GFR calc Af Amer: >90 mL/min (ref >90)
GFR calc non Af Amer: 86 mL/min — ABNORMAL LOW (ref >90)
GLUCOSE: 131 mg/dL — AB (ref 70–99)
POTASSIUM: 4.1 mmol/L (ref 3.5–5.1)
Sodium: 136 mmol/L (ref 135–145)
Total Protein: 5.7 g/dL — ABNORMAL LOW (ref 6.0–8.3)

## 2014-10-10 LAB — CBC
HCT: 40.3 % (ref 39.0–52.0)
Hemoglobin: 13.9 g/dL (ref 13.0–17.0)
MCH: 31.2 pg (ref 26.0–34.0)
MCHC: 34.5 g/dL (ref 30.0–36.0)
MCV: 90.4 fL (ref 78.0–100.0)
PLATELETS: 154 10*3/uL (ref 150–400)
RBC: 4.46 MIL/uL (ref 4.22–5.81)
RDW: 12.7 % (ref 11.5–15.5)
WBC: 8.7 10*3/uL (ref 4.0–10.5)

## 2014-10-10 LAB — URINALYSIS, ROUTINE W REFLEX MICROSCOPIC
Bilirubin Urine: NEGATIVE
GLUCOSE, UA: NEGATIVE mg/dL
Hgb urine dipstick: NEGATIVE
KETONES UR: NEGATIVE mg/dL
Leukocytes, UA: NEGATIVE
Nitrite: NEGATIVE
Protein, ur: NEGATIVE mg/dL
Specific Gravity, Urine: 1.019 (ref 1.005–1.030)
Urobilinogen, UA: 1 mg/dL (ref 0.0–1.0)
pH: 6.5 (ref 5.0–8.0)

## 2014-10-10 LAB — PROTIME-INR
INR: 1.11 (ref 0.00–1.49)
PROTHROMBIN TIME: 14.4 s (ref 11.6–15.2)

## 2014-10-10 LAB — CBG MONITORING, ED: Glucose-Capillary: 110 mg/dL — ABNORMAL HIGH (ref 70–99)

## 2014-10-10 LAB — TROPONIN I: Troponin I: <0.03 ng/mL (ref <0.031)

## 2014-10-10 MED ORDER — OXYCODONE HCL 5 MG PO TABS
5.0000 mg | ORAL_TABLET | ORAL | Status: DC | PRN
  Administered 2014-10-10 – 2014-10-11 (×2): 5 mg via ORAL
  Filled 2014-10-10 (×2): qty 1

## 2014-10-10 MED ORDER — ACETAMINOPHEN 325 MG PO TABS: 325.0000 mg | ORAL_TABLET | Freq: Four times a day (QID) | ORAL | Status: DC | PRN

## 2014-10-10 MED ORDER — ENOXAPARIN SODIUM 30 MG/0.3ML ~~LOC~~ SOLN
30.0000 mg | Freq: Two times a day (BID) | SUBCUTANEOUS | Status: DC
  Administered 2014-10-10 – 2014-10-11 (×2): 30 mg via SUBCUTANEOUS
  Filled 2014-10-10 (×2): qty 0.3

## 2014-10-10 MED ORDER — SODIUM CHLORIDE 0.9 % IJ SOLN
3.0000 mL | Freq: Two times a day (BID) | INTRAMUSCULAR | Status: DC
  Administered 2014-10-10 – 2014-10-11 (×2): 3 mL via INTRAVENOUS

## 2014-10-10 NOTE — ED Provider Notes (Signed)
CSN: [id number]     Arrival date & time 10/10/14  1109 History   First MD Initiated Contact with Patient 10/10/14 1122     Chief Complaint  Patient presents with  . Loss of Consciousness      HPI Per EMS, Patient is coming from home. Patient woke up this morning and took a long hot shower. After shower, Patient had a syncopal episode where his girlfriend helped him to the ground. Patient reports hitting head on dresser on the way down, but denies any other injuries. Patient is currently on Lovenox after Right Hip replacement on Monday. EMS found patient on ground in supine position when they arrived. Patient was moved to stretcher and had a second syncopal episode that lasted about ten seconds. BP dropped to 80/40, patient became pale and diaphoretic. Patient became responsive immediately after being returned to supine position. Patient was given 600 mL of Normal Saline. Patient arrives to the ED Alert and Oriented x4. Last Vitals per EMS: 150/80, 60 HR. Patient has history of bradycardia with no other cardiac history. Patient denies any current pain. Past Medical History  Diagnosis Date  . Hypercholesterolemia     ? statin intolerant. May tolerate Lipitor  . DVT (deep venous thrombosis) 1979    RLE  . Basal cell carcinoma   . Syncope and collapse 8-10 yrs ago  . Bradycardia vasovagal response 10 yrs ago    asymptomatic  . BPH (benign prostatic hyperplasia)   . Normal nuclear stress test May 2011  . Erectile dysfunction   . Diastolic dysfunction     Per echo in 2008  . Obesity   . Pulmonary embolism 1979  . Arthritis     "knees, hips" (10/10/2014)  . History of gout    Past Surgical History  Procedure Laterality Date  . Cardiovascular stress test  May 2011    EF 73%; No ischemia  . Mohs surgery  6 yrs     nose  . Colonoscopy  2013  . Knee arthroscopy Right 10/03/2012    Procedure: RIGHT KNEE ARTHROSCOPY WITH DEBRIDEMENT;  Surgeon: [name], MD;  Location: [facility];   Service: Orthopedics;  Laterality: Right;  WITH DEBRIDEMENT  . 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 Joint replacement     Family History  Problem Relation Age of Onset  . Adopted: Yes  . Family history unknown: Yes   History  Substance Use Topics  . Smoking status: Former Smoker -- 1.00 packs/day for 10 years    Types: Cigarettes    Quit date: 08/15/1977  . Smokeless tobacco: Never Used  . Alcohol Use: 4.2 oz/week    7 Shots of liquor per week    Review of Systems  All other systems reviewed and are negative  Allergies  Crestor  Home Medications   Prior to Admission medications   Medication Sig Start Date End Date Taking? Authorizing Provider  acetaminophen (TYLENOL) 325 MG tablet Take 325 mg by mouth every 6 (six) hours as needed for mild pain.   Yes Historical Provider, MD  enoxaparin (LOVENOX) 30 MG/0.3ML injection Inject 30 mg into the skin every 12 (twelve) hours.   Yes Historical Provider, MD  OxyCODONE HCl, Abuse Deter, 5 MG TABA Take 5 mg by mouth every 4 (four) hours as needed.   Yes Historical Provider, MD  aspirin 81 MG tablet Take 81 mg by mouth every morning.     Historical Provider, MD  fish oil-omega-3 fatty acids 1000 MG capsule  Take by mouth daily.     Historical Provider, MD  ibuprofen (ADVIL,MOTRIN) 100 MG tablet Take by mouth as needed for pain.     Historical Provider, MD   BP 135/67 mmHg  Pulse 64  Temp(Src) 99.1 F (37.3 C) (Oral)  Resp 18  Ht 5\' 10"  (7.416 m)  Wt 233 lb 9.6 oz (105.96 kg)  BMI 33.52 kg/m2  SpO2 94% Physical Exam  Constitutional: He is oriented to person, place, and time. He appears well-developed and well-nourished. No distress.  HENT:  Head: Normocephalic and atraumatic.  Eyes: Pupils are equal, round, and reactive to light.  Neck: Normal range of motion.  Cardiovascular: Normal rate and intact distal pulses.   Pulmonary/Chest: No respiratory distress.  Abdominal: Normal appearance. He exhibits no distension.    Musculoskeletal: Normal range of motion.       Legs: Neurological: He is alert and oriented to person, place, and time. No cranial nerve deficit.  Skin: Skin is warm and dry. No rash noted.  Psychiatric: He has a normal mood and affect. His behavior is normal.  Nursing note and vitals reviewed.   ED Course  Procedures (including critical care time) Labs Review Labs Reviewed  COMPREHENSIVE METABOLIC PANEL - Abnormal; Notable for the following:    Glucose, Bld 131 (*)    Calcium 8.2 (*)    Total Protein 5.7 (*)    Albumin 3.0 (*)    GFR calc non Af Amer 86 (*)    All other components within normal limits  CBG MONITORING, ED - Abnormal; Notable for the following:    Glucose-Capillary 110 (*)    All other components within normal limits  CBC  PROTIME-INR  TROPONIN I  URINALYSIS, ROUTINE W REFLEX MICROSCOPIC  URINALYSIS, ROUTINE W REFLEX MICROSCOPIC    Imaging Review No results found.   EKG Interpretation   Date/Time:  Friday October 10 2014 11:16:11 EST Ventricular Rate:  62 PR Interval:  174 QRS Duration: 103 QT Interval:  392 QTC Calculation: 398 R Axis:   -20 Text Interpretation:  Sinus rhythm Borderline left axis deviation No  significant change since last tracing Confirmed by Nyemah Watton  MD, Navaya Wiatrek  ([id number]) on 10/10/2014 11:23:17 AM      MDM   Final diagnoses:  Syncope        Dot Lanes, MD 10/19/14 [phone]

## 2014-10-10 NOTE — ED Notes (Signed)
CBG 110. 

## 2014-10-10 NOTE — ED Notes (Signed)
Dr. Naaman Plummer at the bedside.

## 2014-10-10 NOTE — ED Notes (Signed)
Xray at the bedside.

## 2014-10-10 NOTE — ED Notes (Signed)
Attempted Report x1.   

## 2014-10-10 NOTE — H&P (Signed)
Cardiologist: Dontrey Snellgrove is an 69 y.o. male.   Chief Complaint:  Syncope HPI:   The patient is a 69 yo male who underwent anterior right hip replacement this past Monday.  He has a history of hyperlipidemia, DVT, pulmonary embolism, basal cell carcinoma, syncope and collapse 8-10 years ago(thought to be vagal), bradycardia, diastolic dysfunction.  An echocardiogram on 09/26/2014 which revealed a normal ejection fraction and wall motion. There was grade 1 diastolic dysfunction. LV cavity size was normal.  Patient was just seen by Dr. Mare Ferrari in early February and no changes were made to his medications. Normal sinus rhythm on EKG at 62 bpm.  The patient reports that he had just taken a hot shower for the first time since being discharged from [facility].  He was walking down the hall with the aid of his walker and realized using the pass out but before his significant other could get a chair on room he fell down the floor. She mainly felt her pulse and did not feel one.  She was about to start chest compressions when he became alert. She checked his pulse which appeared very slow(she is a Music therapist).  He apparently has been urinating a lot more lately.  When EMS arrived they had tried to sit him up but he merely felt like he was given a pass out again lately and back down.  An IV was started and bolused with NS.  The patient currently denies nausea, vomiting, fever, chest pain, shortness of breath, orthopnea, PND, cough, congestion, abdominal pain, hematochezia, melena, lower extremity edema, claudication.  Medications: Medication Sig  acetaminophen (TYLENOL) 325 MG tablet Take 325 mg by mouth every 6 (six) hours as needed for mild pain.  enoxaparin (LOVENOX) 30 MG/0.3ML injection Inject 30 mg into the skin every 12 (twelve) hours.  OxyCODONE HCl, Abuse Deter, 5 MG TABA Take 5 mg by mouth every 4 (four) hours as needed.  aspirin 81 MG tablet Take 81 mg by mouth every morning.    fish oil-omega-3 fatty acids 1000 MG capsule Take by mouth daily.   ibuprofen (ADVIL,MOTRIN) 100 MG tablet Take by mouth as needed for pain.     Past Medical History  Diagnosis Date  . Hypercholesterolemia     ? statin intolerant. May tolerate Lipitor  . DVT (deep venous thrombosis) 1979  . Pulmonary embolism 1979right leg  . Basal cell carcinoma   . Syncope and collapse 8-10 yrs ago  . Bradycardia vasovagal response 10 yrs ago    asymptomatic  . BPH (benign prostatic hyperplasia)   . Normal nuclear stress test May 2011  . Erectile dysfunction   . Diastolic dysfunction     Per echo in 2008  . Obesity     Past Surgical History  Procedure Laterality Date  . Cardiovascular stress test  May 2011    EF 73%; No ischemia  . [place] procedure  6 yrs     nose  . Colonscopy  2013  . Knee arthroscopy Right 10/03/2012    Procedure: RIGHT KNEE ARTHROSCOPY WITH DEBRIDEMENT;  Surgeon: [name], MD;  Location: [facility];  Service: Orthopedics;  Laterality: Right;  WITH DEBRIDEMENT  . Total hip arthroplasty      Right 2016    No family history on file. Social History:  reports that he quit smoking about 37 years ago. His smoking use included Cigarettes. He has a 10 pack-year smoking history. He has never used smokeless tobacco. He reports  that he drinks about 0.6 oz of alcohol per week. He reports that he does not use illicit drugs.  Allergies:  Allergies  Allergen Reactions  . Crestor [Rosuvastatin Calcium]     LEG PAIN     (Not in a hospital admission)  Results for orders placed or performed during the hospital encounter of 10/10/14 (from the past 48 hour(s))  CBC     Status: None   Collection Time: 10/10/14 11:26 AM  Result Value Ref Range   WBC 8.7 4.0 - 10.5 K/uL   RBC 4.46 4.22 - 5.81 MIL/uL   Hemoglobin 13.9 13.0 - 17.0 g/dL   HCT 40.3 39.0 - 52.0 %   MCV 90.4 78.0 - 100.0 fL   MCH 31.2 26.0 - 34.0 pg   MCHC 34.5 30.0 - 36.0 g/dL   RDW 12.7 11.5 - 15.5 %    Platelets 154 150 - 400 K/uL  Comprehensive metabolic panel     Status: Abnormal   Collection Time: 10/10/14 11:26 AM  Result Value Ref Range   Sodium 136 135 - 145 mmol/L   Potassium 4.1 3.5 - 5.1 mmol/L   Chloride 103 96 - 112 mmol/L   CO2 27 19 - 32 mmol/L   Glucose, Bld 131 (H) 70 - 99 mg/dL   BUN 16 6 - 23 mg/dL   Creatinine, Ser 0.86 0.50 - 1.35 mg/dL   Calcium 8.2 (L) 8.4 - 10.5 mg/dL   Total Protein 5.7 (L) 6.0 - 8.3 g/dL   Albumin 3.0 (L) 3.5 - 5.2 g/dL   AST 29 0 - 37 U/L   ALT 18 0 - 53 U/L   Alkaline Phosphatase 48 39 - 117 U/L   Total Bilirubin 1.0 0.3 - 1.2 mg/dL   GFR calc non Af Amer 86 (L) >90 mL/min   GFR calc Af Amer >90 >90 mL/min    Comment: (NOTE) The eGFR has been calculated using the CKD EPI equation. This calculation has not been validated in all clinical situations. eGFR's persistently <90 mL/min signify possible Chronic Kidney Disease.    Anion gap 6 5 - 15  Protime-INR (if patient is on an anticoagulant)     Status: None   Collection Time: 10/10/14 11:26 AM  Result Value Ref Range   Prothrombin Time 14.4 11.6 - 15.2 seconds   INR 1.11 0.00 - 1.49  CBG monitoring, ED     Status: Abnormal   Collection Time: 10/10/14 11:38 AM  Result Value Ref Range   Glucose-Capillary 110 (H) 70 - 99 mg/dL   Comment 1 Notify RN    Comment 2 Documented in Char    Dg Pelvis Portable  10/10/2014   CLINICAL DATA:  Loss of consciousness.  Recent hip replacement  EXAM: PORTABLE PELVIS 1-2 VIEWS  COMPARISON:  None.  FINDINGS: Right hip replacement in satisfactory position and alignment. No fracture or dislocation. Left hip joint normal.  IMPRESSION: Satisfactory right hip replacement.   Electronically Signed   By: Franchot Gallo M.D.   On: 10/10/2014 12:39   Dg Chest Portable 1 View  10/10/2014   CLINICAL DATA:  Syncopal episode.  Fall.  No reported Chest pain.  EXAM: PORTABLE CHEST - 1 VIEW  COMPARISON:  02/24/2012.  FINDINGS: The heart size and mediastinal contours are  within normal limits. Both lungs are clear. The visualized skeletal structures are unremarkable. Chronic RIGHT pleural parenchymal scarring.  IMPRESSION: No active disease.   Electronically Signed   By: Michae Kava.D.  On: 10/10/2014 12:30    Review of Systems  All other systems reviewed and are negative.   Blood pressure 137/73, pulse 63, temperature 98.1 F (36.7 C), temperature source Oral, resp. rate 17, height '5\' 10"'  (1.778 m), weight 224 lb (101.606 kg), SpO2 97 %. Physical Exam  Nursing note and vitals reviewed. Constitutional: He is oriented to person, place, and time. He appears well-developed and well-nourished.  HENT:  Head: Normocephalic and atraumatic.  Mouth/Throat: No oropharyngeal exudate.  Eyes: EOM are normal. Pupils are equal, round, and reactive to light. No scleral icterus.  Neck: Normal range of motion. Neck supple.  Cardiovascular: Normal rate, regular rhythm, S1 normal and S2 normal.   Murmur heard.  Systolic murmur is present with a grade of 1/6  Pulses:      Radial pulses are 2+ on the right side, and 2+ on the left side.       Dorsalis pedis pulses are 2+ on the right side, and 2+ on the left side.  Respiratory: Effort normal and breath sounds normal. He has no wheezes. He has no rales.  GI: Soft. Bowel sounds are normal.  Musculoskeletal: He exhibits no edema.  Neurological: He is alert and oriented to person, place, and time.  Skin: Skin is warm and dry.  Psychiatric: He has a normal mood and affect.     Assessment/Plan Syncope SP Right hip replacement Hx Bradycardia  The patient will be admitted for observation.  It may be that his syncope was vagally mediated after having a very hot shower.  We will monitor on telemetry.  He is not orthostatic however, that was after ~659m NS.  Will recheck in the am.  Glucose is 131 and was 107 on 09/26/14.  If nothing shows up an tele overnight, I recommend continued HR monitoring at home.  Cooler, seated  showers for the next couple of weeks.  BP looks good.  No adverse trauma to the new hip was seen on xray.  Albumin decreased from prior on 2/12(4.1 to 3.0).  May account for increased UOP.  Will check a UA.  Not eating as well post op?  SCr WNL.  HTarri Fuller PFriedensburg2/26/2016, 1:28 PM  Patient seen and examined with BTarri Fuller PA-C. We discussed all aspects of the encounter. I agree with the assessment and plan as stated above. Syncopal episode likely due to volume depletion and vasodilation in setting of recent surgery and shower. With recent hip surgery is at risk for DVT/PE but has been on lovenox and ECG and presentation not c/w this. Will observe overnight. Check echo. Hydrate.  Continue lovenox.  Mattalynn Crandle,MD 2:42 PM

## 2014-10-10 NOTE — ED Notes (Signed)
MD at the bedside  

## 2014-10-10 NOTE — ED Notes (Signed)
Per EMS, Patient is coming from home. Patient woke up this morning and took a long hot shower. After shower, Patient had a syncopal episode where his girlfriend helped him to the ground. Patient reports hitting head on dresser on the way down, but denies any other injuries. Patient is currently on Lovenox after Right Hip replacement on Monday. EMS found patient on ground in supine position when they arrived. Patient was moved to stretcher and had a second syncopal episode that lasted about ten seconds. BP dropped to 80/40, patient became pale and diaphoretic. Patient became responsive immediately after being returned to supine position. Patient was given 600 mL of Normal Saline. Patient arrives to the ED Alert and Oriented x4. Last Vitals per EMS: 150/80, 60 HR. Patient has history of bradycardia with no other cardiac history. Patient denies any current pain.

## 2014-10-10 NOTE — ED Notes (Signed)
Patient ambulated in the hallway using walker. Gait steady and patient made no complaints of dizziness or pain. Patient's girlfriend and patient state patient is back to baseline.

## 2014-10-11 DIAGNOSIS — R55 Syncope and collapse: Secondary | ICD-10-CM | POA: Diagnosis not present

## 2014-10-11 DIAGNOSIS — Z9889 Other specified postprocedural states: Secondary | ICD-10-CM

## 2014-10-11 DIAGNOSIS — I5189 Other ill-defined heart diseases: Secondary | ICD-10-CM | POA: Diagnosis present

## 2014-10-11 LAB — URINALYSIS, ROUTINE W REFLEX MICROSCOPIC
BILIRUBIN URINE: NEGATIVE
GLUCOSE, UA: NEGATIVE mg/dL
HGB URINE DIPSTICK: NEGATIVE
KETONES UR: NEGATIVE mg/dL
Leukocytes, UA: NEGATIVE
NITRITE: NEGATIVE
PROTEIN: NEGATIVE mg/dL
Specific Gravity, Urine: 1.015 (ref 1.005–1.030)
Urobilinogen, UA: 1 mg/dL (ref 0.0–1.0)
pH: 6.5 (ref 5.0–8.0)

## 2014-10-11 NOTE — Discharge Summary (Signed)
Patient ID: Joe Davis,  MRN: [id number], DOB/AGE: [birth year] 69 y.o.  Admit date: 10/10/2014 Discharge date: 10/11/2014  Primary Care Provider: [name], MD Primary Cardiologist: Dr Mare Ferrari  Discharge Diagnoses Principal Problem:   Syncope and collapse Active Problems:   History of hip surgery 10/05/14   Remote history of venous thrombosis and embolism   Hypercholesterolemia   Diastolic dysfunction    Hospital Course: 69 year old gentleman has a history of diastolic left ventricular dysfunction. He has a remote history of DVT with pulmonary embolus. He has a history of hypercholesterolemia and atypical chest pain. His last echocardiogram was 03/14/12 which showed normal left ventricular systolic function with ejection fraction 55-60%. There was no evidence of right heart dysfunction. There was slight thickening of the aortic valve but no mention of aortic valve stenosis. He has had a history of episodic bradycardia in the past per office notes.  He saw Dr Mare Ferrari 09/18/14 in the office prior to having hip surgery at [facility] 10/06/14. The pt was admitted to [facility] 10/10/14 after he had a syncopal spell. The pt had just gotten out of the shower and was walking with his walker when he could tell he was going to pass out. He fell to the floor before he could get to a chair. His S/O, a Music therapist,  Michela Pitcher she could not feel a pulse. She was about to start CPR when the patient woke up. He was given an IVF bolus by EMS and transported. His EKG showed NSR 62. The pt was noted to have been on full dose Lovenox since his discharge from [facility]. He was admitted and monitored overnight. Dr Marlou Porch feels the pt had vaso vagal syncope and he can be discharged the morning of the 27 th. He will follow up with Dr Mare Ferrari in 2-4 weeks.   Discharge Vitals:  Blood pressure 135/67, pulse 64, temperature 99.1 F (37.3 C), temperature source Oral, resp. rate 18, height 5\' 10"  (1.778 m), weight 233  lb 9.6 oz (105.96 kg), SpO2 94 %.    Labs: Results for orders placed or performed during the hospital encounter of 10/10/14 (from the past 24 hour(s))  CBG monitoring, ED     Status: Abnormal   Collection Time: 10/10/14 11:38 AM  Result Value Ref Range   Glucose-Capillary 110 (H) 70 - 99 mg/dL   Comment 1 Notify RN    Comment 2 Documented in Char   Troponin I     Status: None   Collection Time: 10/10/14  1:25 PM  Result Value Ref Range   Troponin I <0.03 <0.031 ng/mL  Urinalysis, Routine w reflex microscopic     Status: None   Collection Time: 10/10/14  4:02 PM  Result Value Ref Range   Color, Urine YELLOW YELLOW   APPearance CLEAR CLEAR   Specific Gravity, Urine 1.019 1.005 - 1.030   pH 6.5 5.0 - 8.0   Glucose, UA NEGATIVE NEGATIVE mg/dL   Hgb urine dipstick NEGATIVE NEGATIVE   Bilirubin Urine NEGATIVE NEGATIVE   Ketones, ur NEGATIVE NEGATIVE mg/dL   Protein, ur NEGATIVE NEGATIVE mg/dL   Urobilinogen, UA 1.0 0.0 - 1.0 mg/dL   Nitrite NEGATIVE NEGATIVE   Leukocytes, UA NEGATIVE NEGATIVE  Urinalysis, Routine w reflex microscopic     Status: None   Collection Time: 10/11/14  5:11 AM  Result Value Ref Range   Color, Urine YELLOW YELLOW   APPearance CLEAR CLEAR   Specific Gravity, Urine 1.015 1.005 - 1.030  pH 6.5 5.0 - 8.0   Glucose, UA NEGATIVE NEGATIVE mg/dL   Hgb urine dipstick NEGATIVE NEGATIVE   Bilirubin Urine NEGATIVE NEGATIVE   Ketones, ur NEGATIVE NEGATIVE mg/dL   Protein, ur NEGATIVE NEGATIVE mg/dL   Urobilinogen, UA 1.0 0.0 - 1.0 mg/dL   Nitrite NEGATIVE NEGATIVE   Leukocytes, UA NEGATIVE NEGATIVE    Disposition:    Discharge Medications:    Medication List    ASK your doctor about these medications        acetaminophen 325 MG tablet  Commonly known as:  TYLENOL  Take 325 mg by mouth every 6 (six) hours as needed for mild pain.     aspirin 81 MG tablet  Take 81 mg by mouth every morning.     enoxaparin 30 MG/0.3ML injection  Commonly known as:   LOVENOX  Inject 30 mg into the skin every 12 (twelve) hours.     fish oil-omega-3 fatty acids 1000 MG capsule  Take by mouth daily.     ibuprofen 100 MG tablet  Commonly known as:  ADVIL,MOTRIN  Take by mouth as needed for pain.     OxyCODONE HCl (Abuse Deter) 5 MG Taba  Take 5 mg by mouth every 4 (four) hours as needed.         Duration of Discharge Encounter: Greater than 30 minutes including physician time.  Angelena Form PA-C 10/11/2014 11:32 AM

## 2014-10-11 NOTE — Progress Notes (Addendum)
    Subjective:  No further symptoms. No SOB  No CP.   Objective:  Vital Signs in the last 24 hours: Temp:  [98.1 F (36.7 C)-99.2 F (37.3 C)] 99.1 F (37.3 C) (02/27 0428) Pulse Rate:  [60-84] 64 (02/27 0428) Resp:  [11-22] 18 (02/27 0428) BP: (112-155)/(64-86) 135/67 mmHg (02/26 2213) SpO2:  [94 %-100 %] 94 % (02/27 0428) Weight:  [224 lb (101.606 kg)-233 lb 9.6 oz (105.96 kg)] 233 lb 9.6 oz (105.96 kg) (02/27 0428)  Intake/Output from previous day: 02/26 0701 - 02/27 0700 In: 120 [P.O.:120] Out: 450 [Urine:450]   Physical Exam: General: Well developed, well nourished, in no acute distress. Head:  Normocephalic and atraumatic. Lungs: Clear to auscultation and percussion. Heart: Normal S1 and S2.  No murmur, rubs or gallops.  Abdomen: soft, non-tender, positive bowel sounds. Extremities: No clubbing or cyanosis. No edema. Neurologic: Alert and oriented x 3.    Lab Results:  Recent Labs  10/10/14 1126  WBC 8.7  HGB 13.9  PLT 154    Recent Labs  10/10/14 1126  NA 136  K 4.1  CL 103  CO2 27  GLUCOSE 131*  BUN 16  CREATININE 0.86    Recent Labs  10/10/14 1325  TROPONINI <0.03   Hepatic Function Panel  Recent Labs  10/10/14 1126  PROT 5.7*  ALBUMIN 3.0*  AST 29  ALT 18  ALKPHOS 48  BILITOT 1.0    Imaging: Dg Pelvis Portable  10/10/2014   CLINICAL DATA:  Loss of consciousness.  Recent hip replacement  EXAM: PORTABLE PELVIS 1-2 VIEWS  COMPARISON:  None.  FINDINGS: Right hip replacement in satisfactory position and alignment. No fracture or dislocation. Left hip joint normal.  IMPRESSION: Satisfactory right hip replacement.   Electronically Signed   By: Franchot Gallo M.D.   On: 10/10/2014 12:39   Dg Chest Portable 1 View  10/10/2014   CLINICAL DATA:  Syncopal episode.  Fall.  No reported Chest pain.  EXAM: PORTABLE CHEST - 1 VIEW  COMPARISON:  02/24/2012.  FINDINGS: The heart size and mediastinal contours are within normal limits. Both lungs  are clear. The visualized skeletal structures are unremarkable. Chronic RIGHT pleural parenchymal scarring.  IMPRESSION: No active disease.   Electronically Signed   By: Rolla Flatten M.D.   On: 10/10/2014 12:30     Telemetry: No adverse rhythms, NSR Personally viewed.   EKG:  NSR normal QT    Assessment/Plan:  Active Problems:   Syncope and collapse  69 year old with vasovagal syncope post op total hip at [facility].    - classic symptoms, diaphoresis, pallor. Has had this before.  - Compression hose, salt liberalization, lay down if prodrome begins  - Discussed how pacer would not likely help in this situation, even with transient vagal mediated bradycardia.   - Note, he is not on any antihypertensives at home, no Flomax like medications.   - OK for DC home. Discussed with he and significant other (nurse anesthetist).   - Have follow up with Dr. Mare Ferrari in 2-4 weeks.    Azhia Siefken, [place] 10/11/2014, 11:15 AM

## 2014-10-11 NOTE — Discharge Instructions (Signed)
Syncope °Syncope means a person passes out (faints). The person usually wakes up in less than 5 minutes. It is important to seek medical care for syncope. °HOME CARE °· Have someone stay with you until you feel normal. °· Do not drive, use machines, or play sports until your doctor says it is okay. °· Keep all doctor visits as told. °· Lie down when you feel like you might pass out. Take deep breaths. Wait until you feel normal before standing up. °· Drink enough fluids to keep your pee (urine) clear or pale yellow. °· If you take blood pressure or heart medicine, get up slowly. Take several minutes to sit and then stand. °GET HELP RIGHT AWAY IF:  °· You have a severe headache. °· You have pain in the chest, belly (abdomen), or back. °· You are bleeding from the mouth or butt (rectum). °· You have black or tarry poop (stool). °· You have an irregular or very fast heartbeat. °· You have pain with breathing. °· You keep passing out, or you have shaking (seizures) when you pass out. °· You pass out when sitting or lying down. °· You feel confused. °· You have trouble walking. °· You have severe weakness. °· You have vision problems. °If you fainted, call your local emergency services (911 in [place] Do not drive yourself to the hospital. °MAKE SURE YOU:  °· Understand these instructions. °· Will watch your condition. °· Will get help right away if you are not doing well or get worse. °Document Released: 01/18/2008 Document Revised: 01/31/2012 Document Reviewed: 09/30/2011 °ExitCare® Patient Information ©2015 [employer]. This information is not intended to replace advice given to you by your health care provider. Make sure you discuss any questions you have with your health care provider. ° °

## 2014-10-13 DIAGNOSIS — I10 Essential (primary) hypertension: Secondary | ICD-10-CM | POA: Diagnosis not present

## 2014-10-13 DIAGNOSIS — Z471 Aftercare following joint replacement surgery: Secondary | ICD-10-CM | POA: Diagnosis not present

## 2014-10-13 DIAGNOSIS — R2689 Other abnormalities of gait and mobility: Secondary | ICD-10-CM | POA: Diagnosis not present

## 2014-10-13 DIAGNOSIS — M6281 Muscle weakness (generalized): Secondary | ICD-10-CM | POA: Diagnosis not present

## 2014-10-13 DIAGNOSIS — M109 Gout, unspecified: Secondary | ICD-10-CM | POA: Diagnosis not present

## 2014-10-13 DIAGNOSIS — Z96641 Presence of right artificial hip joint: Secondary | ICD-10-CM | POA: Diagnosis not present

## 2014-10-16 ENCOUNTER — Ambulatory Visit (INDEPENDENT_AMBULATORY_CARE_PROVIDER_SITE_OTHER)
Admission: RE | Admit: 2014-10-16 | Discharge: 2014-10-16 | Disposition: A | Discharge status: Home / Self Care | Payer: Medicare Other | Source: Ambulatory Visit | Attending: Cardiology | Admitting: Cardiology

## 2014-10-16 ENCOUNTER — Telehealth: Payer: Self-pay | Admitting: Cardiology

## 2014-10-16 DIAGNOSIS — M6281 Muscle weakness (generalized): Secondary | ICD-10-CM | POA: Diagnosis not present

## 2014-10-16 DIAGNOSIS — S0990XA Unspecified injury of head, initial encounter: Secondary | ICD-10-CM | POA: Diagnosis not present

## 2014-10-16 DIAGNOSIS — Z471 Aftercare following joint replacement surgery: Secondary | ICD-10-CM | POA: Diagnosis not present

## 2014-10-16 DIAGNOSIS — I10 Essential (primary) hypertension: Secondary | ICD-10-CM | POA: Diagnosis not present

## 2014-10-16 DIAGNOSIS — Z96641 Presence of right artificial hip joint: Secondary | ICD-10-CM | POA: Diagnosis not present

## 2014-10-16 DIAGNOSIS — R2689 Other abnormalities of gait and mobility: Secondary | ICD-10-CM | POA: Diagnosis not present

## 2014-10-16 DIAGNOSIS — M109 Gout, unspecified: Secondary | ICD-10-CM | POA: Diagnosis not present

## 2014-10-16 DIAGNOSIS — R51 Headache: Secondary | ICD-10-CM | POA: Diagnosis not present

## 2014-10-16 NOTE — Telephone Encounter (Signed)
New Msg       Dion Saucier calling and requesting call back.   Wouldn't provide details but states its urgent.

## 2014-10-16 NOTE — Telephone Encounter (Signed)
Spoke with Joe Davis and patient fell (syncope) Friday and hit his head.  Patient has had headache since then Patient recently had knee surgery and was taking Lovenox injections at time of fall Will arrange CT of head today

## 2014-10-21 DIAGNOSIS — R2689 Other abnormalities of gait and mobility: Secondary | ICD-10-CM | POA: Diagnosis not present

## 2014-10-21 DIAGNOSIS — M6281 Muscle weakness (generalized): Secondary | ICD-10-CM | POA: Diagnosis not present

## 2014-10-21 DIAGNOSIS — Z96641 Presence of right artificial hip joint: Secondary | ICD-10-CM | POA: Diagnosis not present

## 2014-10-21 DIAGNOSIS — Z471 Aftercare following joint replacement surgery: Secondary | ICD-10-CM | POA: Diagnosis not present

## 2014-10-21 DIAGNOSIS — M109 Gout, unspecified: Secondary | ICD-10-CM | POA: Diagnosis not present

## 2014-10-21 DIAGNOSIS — I10 Essential (primary) hypertension: Secondary | ICD-10-CM | POA: Diagnosis not present

## 2014-10-24 DIAGNOSIS — M109 Gout, unspecified: Secondary | ICD-10-CM | POA: Diagnosis not present

## 2014-10-24 DIAGNOSIS — R2689 Other abnormalities of gait and mobility: Secondary | ICD-10-CM | POA: Diagnosis not present

## 2014-10-24 DIAGNOSIS — M1711 Unilateral primary osteoarthritis, right knee: Secondary | ICD-10-CM | POA: Diagnosis not present

## 2014-10-24 DIAGNOSIS — Z471 Aftercare following joint replacement surgery: Secondary | ICD-10-CM | POA: Diagnosis not present

## 2014-10-24 DIAGNOSIS — M6281 Muscle weakness (generalized): Secondary | ICD-10-CM | POA: Diagnosis not present

## 2014-10-24 DIAGNOSIS — I10 Essential (primary) hypertension: Secondary | ICD-10-CM | POA: Diagnosis not present

## 2014-10-24 DIAGNOSIS — Z96641 Presence of right artificial hip joint: Secondary | ICD-10-CM | POA: Diagnosis not present

## 2014-10-27 DIAGNOSIS — Z471 Aftercare following joint replacement surgery: Secondary | ICD-10-CM | POA: Diagnosis not present

## 2014-10-27 DIAGNOSIS — Z96641 Presence of right artificial hip joint: Secondary | ICD-10-CM | POA: Diagnosis not present

## 2014-10-27 DIAGNOSIS — M109 Gout, unspecified: Secondary | ICD-10-CM | POA: Diagnosis not present

## 2014-10-27 DIAGNOSIS — R2689 Other abnormalities of gait and mobility: Secondary | ICD-10-CM | POA: Diagnosis not present

## 2014-10-27 DIAGNOSIS — I10 Essential (primary) hypertension: Secondary | ICD-10-CM | POA: Diagnosis not present

## 2014-10-27 DIAGNOSIS — M6281 Muscle weakness (generalized): Secondary | ICD-10-CM | POA: Diagnosis not present

## 2014-10-30 DIAGNOSIS — R2689 Other abnormalities of gait and mobility: Secondary | ICD-10-CM | POA: Diagnosis not present

## 2014-10-30 DIAGNOSIS — M109 Gout, unspecified: Secondary | ICD-10-CM | POA: Diagnosis not present

## 2014-10-30 DIAGNOSIS — M6281 Muscle weakness (generalized): Secondary | ICD-10-CM | POA: Diagnosis not present

## 2014-10-30 DIAGNOSIS — Z471 Aftercare following joint replacement surgery: Secondary | ICD-10-CM | POA: Diagnosis not present

## 2014-10-30 DIAGNOSIS — I10 Essential (primary) hypertension: Secondary | ICD-10-CM | POA: Diagnosis not present

## 2014-10-30 DIAGNOSIS — Z96641 Presence of right artificial hip joint: Secondary | ICD-10-CM | POA: Diagnosis not present

## 2014-11-03 DIAGNOSIS — Z96641 Presence of right artificial hip joint: Secondary | ICD-10-CM | POA: Diagnosis not present

## 2014-11-03 DIAGNOSIS — I10 Essential (primary) hypertension: Secondary | ICD-10-CM | POA: Diagnosis not present

## 2014-11-03 DIAGNOSIS — M109 Gout, unspecified: Secondary | ICD-10-CM | POA: Diagnosis not present

## 2014-11-03 DIAGNOSIS — M6281 Muscle weakness (generalized): Secondary | ICD-10-CM | POA: Diagnosis not present

## 2014-11-03 DIAGNOSIS — Z471 Aftercare following joint replacement surgery: Secondary | ICD-10-CM | POA: Diagnosis not present

## 2014-11-03 DIAGNOSIS — R2689 Other abnormalities of gait and mobility: Secondary | ICD-10-CM | POA: Diagnosis not present

## 2014-11-04 ENCOUNTER — Encounter: Payer: Medicare Other | Admitting: Cardiology

## 2014-11-19 DIAGNOSIS — Z471 Aftercare following joint replacement surgery: Secondary | ICD-10-CM | POA: Diagnosis not present

## 2014-11-19 DIAGNOSIS — Z96641 Presence of right artificial hip joint: Secondary | ICD-10-CM | POA: Diagnosis not present

## 2014-11-26 ENCOUNTER — Encounter: Payer: Medicare Other | Admitting: Cardiology

## 2015-01-03 ENCOUNTER — Ambulatory Visit (INDEPENDENT_AMBULATORY_CARE_PROVIDER_SITE_OTHER): Payer: Medicare Other | Admitting: Family Medicine

## 2015-01-03 ENCOUNTER — Encounter: Payer: Self-pay | Admitting: Family Medicine

## 2015-01-03 VITALS — BP 120/80 | HR 56 | Temp 97.9°F | Ht 70.0 in | Wt 234.2 lb

## 2015-01-03 DIAGNOSIS — M1A0720 Idiopathic chronic gout, left ankle and foot, without tophus (tophi): Secondary | ICD-10-CM

## 2015-01-03 MED ORDER — INDOMETHACIN 50 MG PO CAPS: 50.0000 mg | ORAL_CAPSULE | Freq: Three times a day (TID) | ORAL | Status: DC | PRN

## 2015-01-03 NOTE — Progress Notes (Signed)
Pre visit review using our clinic review tool, if applicable. No additional management support is needed unless otherwise documented below in the visit note. 

## 2015-01-03 NOTE — Progress Notes (Signed)
Subjective:    Patient ID: Joe Davis, male    DOB: 1946-03-31, 69 y.o.   MRN: [id number]  HPI Here with a gout flare   (has had gout for years)  2 flares for last 4 months  This one started 5 days ago  L foot  Very painful and hard to walk on  Improving a little bit now  Swollen and red   Indocin 50 once per day   Has not been on prophylactic med in the past   Recently ate squid  Likely does not drink enough fluids/water   No fever or constitutional symptoms   Patient Active Problem List   Diagnosis Date Noted  . History of hip surgery 10/05/14 10/11/2014  . Diastolic dysfunction 50/35/4656  . Syncope and collapse 10/10/2014  . Faintness   . Gout 10/03/2013  . Acute medial meniscal tear 10/03/2012  . Palpitation 05/03/2011  . Atypical chest pain 05/03/2011  . Hypercholesterolemia 01/26/2011  . BACK PAIN 01/23/2007  . Pain in limb 01/23/2007  . SKIN CANCER, HX OF 01/23/2007  . Remote history of venous thrombosis and embolism 01/23/2007   Past Medical History  Diagnosis Date  . Hypercholesterolemia     ? statin intolerant. May tolerate Lipitor  . DVT (deep venous thrombosis) 1979    RLE  . Basal cell carcinoma   . Syncope and collapse 8-10 yrs ago  . Bradycardia vasovagal response 10 yrs ago    asymptomatic  . BPH (benign prostatic hyperplasia)   . Normal nuclear stress test May 2011  . Erectile dysfunction   . Diastolic dysfunction     Per echo in 2008  . Obesity   . Pulmonary embolism 1979  . Arthritis     "knees, hips" (10/10/2014)  . History of gout    Past Surgical History  Procedure Laterality Date  . Cardiovascular stress test  May 2011    EF 73%; No ischemia  . Mohs surgery  6 yrs     nose  . Colonoscopy  2013  . Knee arthroscopy Right 10/03/2012    Procedure: RIGHT KNEE ARTHROSCOPY WITH DEBRIDEMENT;  Surgeon: [name], MD;  Location: [facility];  Service: Orthopedics;  Laterality: Right;  WITH DEBRIDEMENT  . 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 Joint replacement     History  Substance Use Topics  . Smoking status: Former Smoker -- 1.00 packs/day for 10 years    Types: Cigarettes    Quit date: 08/15/1977  . Smokeless tobacco: Never Used  . Alcohol Use: 4.2 oz/week    7 Shots of liquor per week   Family History  Problem Relation Age of Onset  . Adopted: Yes  . Family history unknown: Yes   Allergies  Allergen Reactions  . Crestor [Rosuvastatin Calcium]     LEG PAIN   Current Outpatient Prescriptions on File Prior to Visit  Medication Sig Dispense Refill  . acetaminophen (TYLENOL) 325 MG tablet Take 325 mg by mouth every 6 (six) hours as needed for mild pain.    Marland Kitchen aspirin 81 MG tablet Take 81 mg by mouth every morning.     . enoxaparin (LOVENOX) 30 MG/0.3ML injection Inject 30 mg into the skin every 12 (twelve) hours.    . fish oil-omega-3 fatty acids 1000 MG capsule Take by mouth daily.     Marland Kitchen ibuprofen (ADVIL,MOTRIN) 100 MG tablet Take by mouth as needed for pain.     . OxyCODONE HCl, Abuse Deter, 5  MG TABA Take 5 mg by mouth every 4 (four) hours as needed.     No current facility-administered medications on file prior to visit.        Review of Systems Review of Systems  Constitutional: Negative for fever, appetite change, fatigue and unexpected weight change.  Eyes: Negative for pain and visual disturbance.  Respiratory: Negative for cough and shortness of breath.   Cardiovascular: Negative for cp or palpitations    Gastrointestinal: Negative for nausea, diarrhea and constipation.  Genitourinary: Negative for urgency and frequency.  Skin: Negative for pallor or rash   MSK pos for L foot pain with redness and warmth  Neurological: Negative for weakness, light-headedness, numbness and headaches.  Hematological: Negative for adenopathy. Does not bruise/bleed easily.  Psychiatric/Behavioral: Negative for dysphoric mood. The patient is not nervous/anxious.         Objective:   Physical  Exam  Constitutional: He appears well-developed and well-nourished. No distress.  obese and well appearing   HENT:  Head: Normocephalic and atraumatic.  Eyes: Conjunctivae and EOM are normal. Pupils are equal, round, and reactive to light. No scleral icterus.  Neck: Normal range of motion. Neck supple.  Cardiovascular: Normal rate and regular rhythm.   Pulmonary/Chest: Effort normal and breath sounds normal.  Musculoskeletal: He exhibits edema and tenderness.  L MTP joint is swollen/tender/erythematous and warm  Nl rom toes with some pain  Can bear partial wt (per pt improved from yesterday significantly) No crepitus  Nl perf and sens  Lymphadenopathy:    He has no cervical adenopathy.  Neurological: He is alert. He has normal reflexes.  Skin: Skin is warm and dry. No rash noted. There is erythema. No pallor.  Psychiatric: He has a normal mood and affect.          Assessment & Plan:   Problem List Items Addressed This Visit    Gout - Primary    Acute flare L foot/ MTP joint  Improved on indocin - limited amount 2 flares in 2 mo  Disc diet - may need to inc water and dec purines-handout given  Refilled indocin  Disc symptomatic care - see instructions on AVS  Adv to f/u with PCP- disc poss of xr/uric acid testing and ? proph agent if needed

## 2015-01-03 NOTE — Patient Instructions (Signed)
You have a flare of gout  Drink lots of water Avoid high purine foods  Elevate foot and use a cool compress if it helps  Indocin as needed up to three times daily with food (stop if stomach upset)  Make an appt with Dr Elease Hashimoto - to discuss gout and the poss of trying a prophylactic agent

## 2015-01-04 NOTE — Assessment & Plan Note (Signed)
Acute flare L foot/ MTP joint  Improved on indocin - limited amount 2 flares in 2 mo  Disc diet - may need to inc water and dec purines-handout given  Refilled indocin  Disc symptomatic care - see instructions on AVS  Adv to f/u with PCP- disc poss of xr/uric acid testing and ? proph agent if needed

## 2015-01-22 ENCOUNTER — Encounter: Payer: Self-pay | Admitting: Family Medicine

## 2015-01-22 ENCOUNTER — Ambulatory Visit (INDEPENDENT_AMBULATORY_CARE_PROVIDER_SITE_OTHER): Payer: Medicare Other | Admitting: Family Medicine

## 2015-01-22 VITALS — BP 124/80 | HR 60 | Temp 98.4°F | Ht 70.5 in | Wt 224.0 lb

## 2015-01-22 DIAGNOSIS — A35 Other tetanus: Secondary | ICD-10-CM

## 2015-01-22 DIAGNOSIS — Z23 Encounter for immunization: Secondary | ICD-10-CM | POA: Diagnosis not present

## 2015-01-22 DIAGNOSIS — M1000 Idiopathic gout, unspecified site: Secondary | ICD-10-CM

## 2015-01-22 DIAGNOSIS — N400 Enlarged prostate without lower urinary tract symptoms: Secondary | ICD-10-CM | POA: Insufficient documentation

## 2015-01-22 DIAGNOSIS — E7800 Pure hypercholesterolemia, unspecified: Secondary | ICD-10-CM

## 2015-01-22 DIAGNOSIS — Z0000 Encounter for general adult medical examination without abnormal findings: Secondary | ICD-10-CM | POA: Diagnosis not present

## 2015-01-22 NOTE — Progress Notes (Signed)
Pre visit review using our clinic review tool, if applicable. No additional management support is needed unless otherwise documented below in the visit note. 

## 2015-01-22 NOTE — Progress Notes (Signed)
Subjective:    Patient ID: Joe Davis, male    DOB: [birth year], 69 y.o.   MRN: [id number]  HPI Patient seen for Medicare wellness exam and medical follow-up. He had hip surgery with hip replacement at [facility] last winter. Following his surgery had episode of vasovagal syncope. He was admitted in February. Evaluation unremarkable. No episodes since then. He stays active with gardening.  Health maintenance reviewed. No history of documented pneumonia vaccine. He had clinical shingles about 15 years ago. Last tetanus unknown. Colonoscopy up-to-date. Recent EKG in February.  History of recurrent gout and currently has involvement of the right metatarsophalangeal joint. Redness and warmth. No injury. Usually takes indomethacin acutely which works. He has only usually one or 2 episodes per year and declines preventative therapy.  Past Medical History  Diagnosis Date  . Hypercholesterolemia     ? statin intolerant. May tolerate Lipitor  . DVT (deep venous thrombosis) 1979    RLE  . Basal cell carcinoma   . Syncope and collapse 8-10 yrs ago  . Bradycardia vasovagal response 10 yrs ago    asymptomatic  . BPH (benign prostatic hyperplasia)   . Normal nuclear stress test May 2011  . Erectile dysfunction   . Diastolic dysfunction     Per echo in 2008  . Obesity   . Pulmonary embolism 1979  . Arthritis     "knees, hips" (10/10/2014)  . History of gout    Past Surgical History  Procedure Laterality Date  . Cardiovascular stress test  May 2011    EF 73%; No ischemia  . Mohs surgery  6 yrs     nose  . Colonoscopy  2013  . Knee arthroscopy Right 10/03/2012    Procedure: RIGHT KNEE ARTHROSCOPY WITH DEBRIDEMENT;  Surgeon: [name], MD;  Location: [facility];  Service: Orthopedics;  Laterality: Right;  WITH DEBRIDEMENT  . 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 Joint replacement      reports that he quit smoking about 37 years ago. His smoking use included Cigarettes. He has  a 10 pack-year smoking history. He has never used smokeless tobacco. He reports that he drinks about 4.2 oz of alcohol per week. He reports that he does not use illicit drugs. He was adopted. Family history is unknown by patient. Allergies  Allergen Reactions  . Crestor [Rosuvastatin Calcium]     LEG PAIN   1.  Risk factors based on Past Medical , Social, and Family history reviewed and as indicated above with no changes 2.  Limitations in physical activities None.  No recent falls. 3.  Depression/mood No active depression or anxiety issues 4.  Hearing No defiits 5.  ADLs independent in all. 6.  Cognitive function (orientation to time and place, language, writing, speech,memory) no short or long term memory issues.  Language and judgement intact. 7.  Home Safety no issues 8.  Height, weight, and visual acuity.all stable. 9.  Counseling discussed weight loss 10. Recommendation of preventive services. Continue yearly flu vaccine. Prevnar 13 and tetanus booster given. Check on coverage for shingles vaccine 11. Labs based on risk factors none 12. Care Plan as above 13. Other Providers Dr Mare Ferrari- cardiology, Dr Borden-Urology 29. Written schedule of screening/prevention services given to patient.    Review of Systems  Constitutional: Negative for fever, activity change, appetite change and fatigue.  HENT: Negative for congestion, ear pain and trouble swallowing.   Eyes: Negative for pain and visual disturbance.  Respiratory: Negative  for cough, shortness of breath and wheezing.   Cardiovascular: Negative for chest pain and palpitations.  Gastrointestinal: Negative for nausea, vomiting, abdominal pain, diarrhea, constipation, blood in stool, abdominal distention and rectal pain.  Genitourinary: Negative for dysuria, hematuria and testicular pain.  Musculoskeletal: Positive for arthralgias. Negative for joint swelling.  Skin: Negative for rash.  Neurological: Negative for dizziness,  syncope and headaches.  Hematological: Negative for adenopathy.  Psychiatric/Behavioral: Negative for confusion and dysphoric mood.       Objective:   Physical Exam  Constitutional: He is oriented to person, place, and time. He appears well-developed and well-nourished. No distress.  HENT:  Head: Normocephalic and atraumatic.  Right Ear: External ear normal.  Left Ear: External ear normal.  Mouth/Throat: Oropharynx is clear and moist.  Eyes: Conjunctivae and EOM are normal. Pupils are equal, round, and reactive to light.  Neck: Normal range of motion. Neck supple. No thyromegaly present.  Cardiovascular: Normal rate, regular rhythm and normal heart sounds.   No murmur heard. Pulmonary/Chest: No respiratory distress. He has no wheezes. He has no rales.  Abdominal: Soft. Bowel sounds are normal. He exhibits no distension and no mass. There is no tenderness. There is no rebound and no guarding.  Musculoskeletal: He exhibits no edema.  Right foot mild swelling and mild erythema metatarsophalangeal joint. Minimally tender.  Lymphadenopathy:    He has no cervical adenopathy.  Neurological: He is alert and oriented to person, place, and time. He displays normal reflexes. No cranial nerve deficit.  Skin: No rash noted.  Psychiatric: He has a normal mood and affect.          Assessment & Plan:  #1 health maintenance. Prevnar 13 given. Consider Pneumovax in 1 year. Check on coverage for shingles vaccine. Tetanus booster given. Colonoscopy up-to-date. Continue yearly flu vaccine #2 gout. Current involvement right foot. Continue indomethacin as needed. We discussed possible prophylaxis and at this point he is not interested. #3 recent vasovagal episode. None since then. This was following surgery

## 2015-01-22 NOTE — Patient Instructions (Signed)
Continue yearly flu vaccine Check on insurance coverage for shingles vaccine Your tetanus booster is good for 10 years

## 2015-01-27 ENCOUNTER — Ambulatory Visit: Payer: Medicare Other | Admitting: Cardiology

## 2015-04-15 DIAGNOSIS — M5417 Radiculopathy, lumbosacral region: Secondary | ICD-10-CM | POA: Diagnosis not present

## 2015-04-15 DIAGNOSIS — M4727 Other spondylosis with radiculopathy, lumbosacral region: Secondary | ICD-10-CM | POA: Diagnosis not present

## 2015-04-21 DIAGNOSIS — M9943 Connective tissue stenosis of neural canal of lumbar region: Secondary | ICD-10-CM | POA: Diagnosis not present

## 2015-04-21 DIAGNOSIS — M5116 Intervertebral disc disorders with radiculopathy, lumbar region: Secondary | ICD-10-CM | POA: Diagnosis not present

## 2015-04-21 DIAGNOSIS — M5417 Radiculopathy, lumbosacral region: Secondary | ICD-10-CM | POA: Diagnosis not present

## 2015-05-07 DIAGNOSIS — H40053 Ocular hypertension, bilateral: Secondary | ICD-10-CM | POA: Diagnosis not present

## 2015-05-07 DIAGNOSIS — H524 Presbyopia: Secondary | ICD-10-CM | POA: Diagnosis not present

## 2015-05-07 DIAGNOSIS — H5213 Myopia, bilateral: Secondary | ICD-10-CM | POA: Diagnosis not present

## 2015-05-07 DIAGNOSIS — H52223 Regular astigmatism, bilateral: Secondary | ICD-10-CM | POA: Diagnosis not present

## 2015-05-07 DIAGNOSIS — H25813 Combined forms of age-related cataract, bilateral: Secondary | ICD-10-CM | POA: Diagnosis not present

## 2015-06-02 DIAGNOSIS — H401131 Primary open-angle glaucoma, bilateral, mild stage: Secondary | ICD-10-CM | POA: Diagnosis not present

## 2015-06-02 DIAGNOSIS — H40013 Open angle with borderline findings, low risk, bilateral: Secondary | ICD-10-CM | POA: Diagnosis not present

## 2015-06-02 DIAGNOSIS — H3589 Other specified retinal disorders: Secondary | ICD-10-CM | POA: Diagnosis not present

## 2015-06-02 DIAGNOSIS — H40053 Ocular hypertension, bilateral: Secondary | ICD-10-CM | POA: Diagnosis not present

## 2015-06-18 DIAGNOSIS — M5137 Other intervertebral disc degeneration, lumbosacral region: Secondary | ICD-10-CM | POA: Diagnosis not present

## 2015-06-18 DIAGNOSIS — M5117 Intervertebral disc disorders with radiculopathy, lumbosacral region: Secondary | ICD-10-CM | POA: Diagnosis not present

## 2015-06-18 DIAGNOSIS — M5127 Other intervertebral disc displacement, lumbosacral region: Secondary | ICD-10-CM | POA: Diagnosis not present

## 2015-06-18 DIAGNOSIS — M4806 Spinal stenosis, lumbar region: Secondary | ICD-10-CM | POA: Diagnosis not present

## 2015-06-19 ENCOUNTER — Telehealth: Payer: Self-pay | Admitting: Cardiology

## 2015-06-19 NOTE — Telephone Encounter (Signed)
New Message   Joe Davis is calling she said she brought pt to the office for an appt in October she said while she was in the office they discovered pt did not have an appt  She said she got a ticket for parking in handicap  She said I need to get out of paying for this 200 ticket  I asked her what date was the appt in October (I don't see any appt in October)   she yelled and said I don't know I don't care I just need your office to write a letter stating we was there so I don't have to pay for this ticket.

## 2015-06-19 NOTE — Telephone Encounter (Signed)
Fax number # [phone] [place] Parking enforcement   October 5 and citation # [phone]

## 2015-06-24 ENCOUNTER — Encounter: Payer: Self-pay | Admitting: *Deleted

## 2015-06-26 NOTE — Telephone Encounter (Signed)
Faxed letter, confirmation received

## 2015-06-30 DIAGNOSIS — M9943 Connective tissue stenosis of neural canal of lumbar region: Secondary | ICD-10-CM | POA: Diagnosis not present

## 2015-07-16 ENCOUNTER — Ambulatory Visit (INDEPENDENT_AMBULATORY_CARE_PROVIDER_SITE_OTHER): Payer: Medicare Other | Admitting: Adult Health

## 2015-07-16 ENCOUNTER — Encounter: Payer: Self-pay | Admitting: Adult Health

## 2015-07-16 ENCOUNTER — Telehealth: Payer: Self-pay | Admitting: Family Medicine

## 2015-07-16 VITALS — BP 118/80 | HR 87 | Temp 99.0°F | Ht 70.5 in | Wt 230.0 lb

## 2015-07-16 DIAGNOSIS — J209 Acute bronchitis, unspecified: Secondary | ICD-10-CM

## 2015-07-16 MED ORDER — METHYLPREDNISOLONE 4 MG PO TBPK: ORAL_TABLET | ORAL | Status: DC

## 2015-07-16 MED ORDER — HYDROCODONE-HOMATROPINE 5-1.5 MG/5ML PO SYRP: 5.0000 mL | ORAL_SOLUTION | Freq: Three times a day (TID) | ORAL | Status: DC | PRN

## 2015-07-16 NOTE — Progress Notes (Signed)
Subjective:    Patient ID: Joe Davis, male    DOB: [birth year], 69 y.o.   MRN: [id number]  HPI  69 year old male, patient of Dr. Elease Hashimoto. Presents to the office today for productive cough, congestion, chest discomfort, headache and fatigue  for the last six days. Symptoms have stayed the same for the last six days.   He denies any fevers, nausea, vomiting, diarrhea. Does not have any sinus pain or pressure.   He has been using Nyquil without relief.   Review of Systems  Constitutional: Positive for fatigue. Negative for fever and chills.  HENT: Positive for congestion and sore throat. Negative for ear discharge, ear pain, postnasal drip, rhinorrhea, sinus pressure and trouble swallowing.   Eyes: Negative.   Respiratory: Positive for cough and chest tightness. Negative for shortness of breath.   Cardiovascular: Negative.   Gastrointestinal: Negative.   Neurological: Negative.   All other systems reviewed and are negative.  Past Medical History  Diagnosis Date  . Hypercholesterolemia     ? statin intolerant. May tolerate Lipitor  . DVT (deep venous thrombosis) ([place]) 1979    RLE  . Basal cell carcinoma   . Syncope and collapse 8-10 yrs ago  . Bradycardia vasovagal response 10 yrs ago    asymptomatic  . BPH (benign prostatic hyperplasia)   . Normal nuclear stress test May 2011  . Erectile dysfunction   . Diastolic dysfunction     Per echo in 2008  . Obesity   . Pulmonary embolism ([place]) 1979  . Arthritis     "knees, hips" (10/10/2014)  . History of gout     Social History   Social History  . Marital Status: Divorced    Spouse Name: N/A  . Number of Children: N/A  . Years of Education: N/A   Occupational History  . Not on file.   Social History Main Topics  . Smoking status: Former Smoker -- 1.00 packs/day for 10 years    Types: Cigarettes    Quit date: 08/15/1977  . Smokeless tobacco: Never Used  . Alcohol Use: 4.2 oz/week    7 Shots of liquor per week    . Drug Use: No  . Sexual Activity: Yes   Other Topics Concern  . Not on file   Social History Narrative    Past Surgical History  Procedure Laterality Date  . Cardiovascular stress test  May 2011    EF 73%; No ischemia  . Mohs surgery  6 yrs     nose  . Colonoscopy  2013  . Knee arthroscopy Right 10/03/2012    Procedure: RIGHT KNEE ARTHROSCOPY WITH DEBRIDEMENT;  Surgeon: [name], MD;  Location: [facility];  Service: Orthopedics;  Laterality: Right;  WITH DEBRIDEMENT  . 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 Joint replacement      Family History  Problem Relation Age of Onset  . Adopted: Yes  . Family history unknown: Yes    Allergies  Allergen Reactions  . Crestor [Rosuvastatin Calcium]     LEG PAIN    Current Outpatient Prescriptions on File Prior to Visit  Medication Sig Dispense Refill  . acetaminophen (TYLENOL) 325 MG tablet Take 325 mg by mouth every 6 (six) hours as needed for mild pain.    Marland Kitchen aspirin 81 MG tablet Take 81 mg by mouth every morning.     . fish oil-omega-3 fatty acids 1000 MG capsule Take by mouth daily.     Marland Kitchen  ibuprofen (ADVIL,MOTRIN) 100 MG tablet Take by mouth as needed for pain.     . indomethacin (INDOCIN) 50 MG capsule Take 1 capsule (50 mg total) by mouth 3 (three) times daily as needed for moderate pain (for gout wiht meals). 30 capsule 1  . Plant Sterol Stanol-Pantethine (CHOLESTOFF COMPLETE PO) Take by mouth.     No current facility-administered medications on file prior to visit.    BP 118/80 mmHg  Pulse 87  Temp(Src) 99 F (37.2 C) (Oral)  Ht 5' 10.5" (1.791 m)  Wt 230 lb (104.327 kg)  BMI 32.52 kg/m2  SpO2 96%       Objective:   Physical Exam  Constitutional: He is oriented to person, place, and time. He appears well-developed and well-nourished. No distress.  HENT:  Head: Normocephalic and atraumatic.  Right Ear: External ear normal.  Left Ear: External ear normal.  Nose: Nose normal.  Mouth/Throat: Oropharynx  is clear and moist. No oropharyngeal exudate.  Eyes: Conjunctivae and EOM are normal. Pupils are equal, round, and reactive to light. Right eye exhibits no discharge. Left eye exhibits no discharge.  Neck: Normal range of motion. Neck supple.  Cardiovascular: Normal rate, regular rhythm, normal heart sounds and intact distal pulses.  Exam reveals no gallop and no friction rub.   No murmur heard. Pulmonary/Chest: Effort normal and breath sounds normal. No respiratory distress. He has no wheezes. He has no rales. He exhibits no tenderness.  Lymphadenopathy:    He has no cervical adenopathy.  Neurological: He is alert and oriented to person, place, and time.  Skin: Skin is warm and dry. No rash noted. He is not diaphoretic. No erythema. No pallor.  Psychiatric: He has a normal mood and affect. His behavior is normal. Judgment and thought content normal.  Nursing note and vitals reviewed.      Assessment & Plan:  1. Acute bronchitis, unspecified organism - Lungs clear, no fever or feeling of acute illness, little concern for PNA at this time.  - methylPREDNISolone (MEDROL DOSEPAK) 4 MG TBPK tablet; Take as directed  Dispense: 21 tablet; Refill: 0 - HYDROcodone-homatropine (HYCODAN) 5-1.5 MG/5ML syrup; Take 5 mLs by mouth every 8 (eight) hours as needed for cough.  Dispense: 120 mL; Refill: 0 - Mucinex during the day - Follow up if no improvement.

## 2015-07-16 NOTE — Telephone Encounter (Signed)
Patient Name: Joe Davis  DOB: July 06, 1946    Initial Comment Caller states he has mucousy cough   Nurse Assessment  Nurse: Raphael Gibney, RN, Vera Date/Time (Eastern Time): 07/16/2015 9:20:16 AM  Confirm and document reason for call. If symptomatic, describe symptoms. ---Caller states he has deep and wet cough. Has had cough for 5 days. Nyquil did not help. No fever. Cough keeps him up at night. coughing up a lot of phlegm.  Has the patient traveled out of the country within the last 30 days? ---No  Does the patient have any new or worsening symptoms? ---Yes  Will a triage be completed? ---Yes  Related visit to physician within the last 2 weeks? ---No  Does the PT have any chronic conditions? (i.e. diabetes, asthma, etc.) ---No  Is this a behavioral health call? ---No     Guidelines    Guideline Title Affirmed Question Affirmed Notes  Cough - Acute Productive SEVERE coughing spells (e.g., whooping sound after coughing, vomiting after coughing)    Final Disposition User   See Physician within 24 Hours Raphael Gibney, RN, Vera    Comments  appt scheduled for 07/16/15 for 10:45 am with Sallee Provencal   Referrals  REFERRED TO PCP OFFICE   Disagree/Comply: Leta Baptist

## 2015-07-16 NOTE — Progress Notes (Signed)
Pre visit review using our clinic review tool, if applicable. No additional management support is needed unless otherwise documented below in the visit note. 

## 2015-07-16 NOTE — Patient Instructions (Addendum)
It was great meeting you today!  Your exam is consistent with bronchitis. I have sent in a prescription for a Medrol Dose Pack, take this as directed.   During the day, use Mucinex Cough. At night use the cough syrup - it will make you sleepy.   If you are not feeling any better in the next 2-3 days, please let me know.     Acute Bronchitis Bronchitis is inflammation of the airways that extend from the windpipe into the lungs (bronchi). The inflammation often causes mucus to develop. This leads to a cough, which is the most common symptom of bronchitis.  In acute bronchitis, the condition usually develops suddenly and goes away over time, usually in a couple weeks. Smoking, allergies, and asthma can make bronchitis worse. Repeated episodes of bronchitis may cause further lung problems.  CAUSES Acute bronchitis is most often caused by the same virus that causes a cold. The virus can spread from person to person (contagious) through coughing, sneezing, and touching contaminated objects. SIGNS AND SYMPTOMS   Cough.   Fever.   Coughing up mucus.   Body aches.   Chest congestion.   Chills.   Shortness of breath.   Sore throat.  DIAGNOSIS  Acute bronchitis is usually diagnosed through a physical exam. Your health care provider will also ask you questions about your medical history. Tests, such as chest X-rays, are sometimes done to rule out other conditions.  TREATMENT  Acute bronchitis usually goes away in a couple weeks. Oftentimes, no medical treatment is necessary. Medicines are sometimes given for relief of fever or cough. Antibiotic medicines are usually not needed but may be prescribed in certain situations. In some cases, an inhaler may be recommended to help reduce shortness of breath and control the cough. A cool mist vaporizer may also be used to help thin bronchial secretions and make it easier to clear the chest.  HOME CARE INSTRUCTIONS  Get plenty of rest.    Drink enough fluids to keep your urine clear or pale yellow (unless you have a medical condition that requires fluid restriction). Increasing fluids may help thin your respiratory secretions (sputum) and reduce chest congestion, and it will prevent dehydration.   Take medicines only as directed by your health care provider.  If you were prescribed an antibiotic medicine, finish it all even if you start to feel better.  Avoid smoking and secondhand smoke. Exposure to cigarette smoke or irritating chemicals will make bronchitis worse. If you are a smoker, consider using nicotine gum or skin patches to help control withdrawal symptoms. Quitting smoking will help your lungs heal faster.   Reduce the chances of another bout of acute bronchitis by washing your hands frequently, avoiding people with cold symptoms, and trying not to touch your hands to your mouth, nose, or eyes.   Keep all follow-up visits as directed by your health care provider.  SEEK MEDICAL CARE IF: Your symptoms do not improve after 1 week of treatment.  SEEK IMMEDIATE MEDICAL CARE IF:  You develop an increased fever or chills.   You have chest pain.   You have severe shortness of breath.  You have bloody sputum.   You develop dehydration.  You faint or repeatedly feel like you are going to pass out.  You develop repeated vomiting.  You develop a severe headache. MAKE SURE YOU:   Understand these instructions.  Will watch your condition.  Will get help right away if you are not doing well or  get worse.   This information is not intended to replace advice given to you by your health care provider. Make sure you discuss any questions you have with your health care provider.   Document Released: 09/08/2004 Document Revised: 08/22/2014 Document Reviewed: 01/22/2013 Elsevier Interactive Patient Education [employer].

## 2015-07-20 ENCOUNTER — Telehealth: Payer: Self-pay | Admitting: Adult Health

## 2015-07-20 NOTE — Telephone Encounter (Signed)
Joe Davis called saying he saw [name] last week and was diagnosed with Bronchitis. The only thing he didn't do was get the Mucinex. He still has a cough and mucus and is wondering if he needs to come back in or try the Mucinex first. The pt would like to be called in regards to this.   Pt ph# [phone] Thank you.

## 2015-07-20 NOTE — Telephone Encounter (Signed)
Left a message for return call.  

## 2015-07-20 NOTE — Telephone Encounter (Signed)
Does he feel any better?   Try the mucinex and if that still doesn't work, come back in.

## 2015-07-24 NOTE — Telephone Encounter (Signed)
Left a message for return call.  

## 2015-07-28 ENCOUNTER — Encounter: Payer: Self-pay | Admitting: Family Medicine

## 2015-07-28 ENCOUNTER — Ambulatory Visit (INDEPENDENT_AMBULATORY_CARE_PROVIDER_SITE_OTHER): Payer: Medicare Other | Admitting: Family Medicine

## 2015-07-28 VITALS — BP 110/80 | HR 95 | Temp 98.7°F | Resp 16 | Ht 70.5 in | Wt 227.0 lb

## 2015-07-28 DIAGNOSIS — Z23 Encounter for immunization: Secondary | ICD-10-CM | POA: Diagnosis not present

## 2015-07-28 DIAGNOSIS — R739 Hyperglycemia, unspecified: Secondary | ICD-10-CM | POA: Diagnosis not present

## 2015-07-28 DIAGNOSIS — R7303 Prediabetes: Secondary | ICD-10-CM | POA: Diagnosis not present

## 2015-07-28 LAB — HEMOGLOBIN A1C: HEMOGLOBIN A1C: 5.9 % (ref 4.6–6.5)

## 2015-07-28 LAB — BASIC METABOLIC PANEL
BUN: 15 mg/dL (ref 6–23)
CHLORIDE: 106 meq/L (ref 96–112)
CO2: 28 meq/L (ref 19–32)
Calcium: 9 mg/dL (ref 8.4–10.5)
Creatinine, Ser: 0.87 mg/dL (ref 0.40–1.50)
GFR: 91.78 mL/min (ref >60.00)
Glucose, Bld: 98 mg/dL (ref 70–99)
POTASSIUM: 4.8 meq/L (ref 3.5–5.1)
Sodium: 142 mEq/L (ref 135–145)

## 2015-07-28 NOTE — Progress Notes (Signed)
   Subjective:    Patient ID: Joe Davis, male    DOB: [birth year], 69 y.o.   MRN: [id number]  HPI Follow-up Recent acute bronchitis. Clinically improved at this time. Minimal residual cough. No fever  History of prediabetes. Does not monitor sugars. No polyuria or polydipsia. Requesting blood sugar check. Fasting this morning. Patient is adopted so family history unknown  History of gout. No recent flareups. Patient has declined prophylaxis. Needs flu vaccine. Had Prevnar last summer.  Past Medical History  Diagnosis Date  . Hypercholesterolemia     ? statin intolerant. May tolerate Lipitor  . DVT (deep venous thrombosis) ([place]) 1979    RLE  . Basal cell carcinoma   . Syncope and collapse 8-10 yrs ago  . Bradycardia vasovagal response 10 yrs ago    asymptomatic  . BPH (benign prostatic hyperplasia)   . Normal nuclear stress test May 2011  . Erectile dysfunction   . Diastolic dysfunction     Per echo in 2008  . Obesity   . Pulmonary embolism ([place]) 1979  . Arthritis     "knees, hips" (10/10/2014)  . History of gout    Past Surgical History  Procedure Laterality Date  . Cardiovascular stress test  May 2011    EF 73%; No ischemia  . Mohs surgery  6 yrs     nose  . Colonoscopy  2013  . Knee arthroscopy Right 10/03/2012    Procedure: RIGHT KNEE ARTHROSCOPY WITH DEBRIDEMENT;  Surgeon: [name], MD;  Location: [facility];  Service: Orthopedics;  Laterality: Right;  WITH DEBRIDEMENT  . 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 Joint replacement      reports that he quit smoking about 37 years ago. His smoking use included Cigarettes. He has a 10 pack-year smoking history. He has never used smokeless tobacco. He reports that he drinks about 4.2 oz of alcohol per week. He reports that he does not use illicit drugs. He was adopted. Family history is unknown by patient. Allergies  Allergen Reactions  . Crestor [Rosuvastatin Calcium]     LEG PAIN      Review of Systems    Constitutional: Negative for fatigue.  Eyes: Negative for visual disturbance.  Respiratory: Negative for cough, chest tightness and shortness of breath.   Cardiovascular: Negative for chest pain, palpitations and leg swelling.  Endocrine: Negative for polydipsia and polyuria.  Neurological: Negative for dizziness, syncope, weakness, light-headedness and headaches.       Objective:   Physical Exam  Constitutional: He appears well-developed and well-nourished. No distress.  Neck: Neck supple. No thyromegaly present.  Cardiovascular: Normal rate and regular rhythm.   Pulmonary/Chest: Effort normal and breath sounds normal. No respiratory distress. He has no wheezes. He has no rales.  Musculoskeletal: He exhibits no edema.          Assessment & Plan:  #1 history of prediabetes. Check fasting blood sugar along with hemoglobin A1c. He is encouraged to lose some weight. Flu vaccine given # 2 history of gout. No recent flareups. #3 health maintenance- schedule Medicare wellness exam after 01/22/2016

## 2015-07-28 NOTE — Progress Notes (Signed)
Pre visit review using our clinic review tool, if applicable. No additional management support is needed unless otherwise documented below in the visit note. 

## 2015-08-05 ENCOUNTER — Ambulatory Visit (INDEPENDENT_AMBULATORY_CARE_PROVIDER_SITE_OTHER): Payer: Medicare Other | Admitting: Family Medicine

## 2015-08-05 ENCOUNTER — Encounter: Payer: Self-pay | Admitting: Family Medicine

## 2015-08-05 VITALS — BP 110/70 | HR 90 | Temp 98.1°F | Resp 16 | Ht 70.5 in | Wt 226.0 lb

## 2015-08-05 DIAGNOSIS — R05 Cough: Secondary | ICD-10-CM

## 2015-08-05 DIAGNOSIS — R059 Cough, unspecified: Secondary | ICD-10-CM

## 2015-08-05 NOTE — Patient Instructions (Signed)
Go for the CXR Take Hycodan cough syrup at night as needed Follow up for any fever or increased shortness of breath.

## 2015-08-05 NOTE — Progress Notes (Signed)
Pre visit review using our clinic review tool, if applicable. No additional management support is needed unless otherwise documented below in the visit note. 

## 2015-08-05 NOTE — Progress Notes (Signed)
   Subjective:    Patient ID: Joe Davis, male    DOB: 09/07/1945, 69 y.o.   MRN: [id number]  HPI  Patient seen with persistent cough Nonsmoker and no chronic lung disease. He developed cough shortly after Thanksgiving. He was seen here on the first and prescribed prednisone and Hycodan cough syrup which he has rarely taken.  [place] more severe cough. He has never had fever. Cough is mostly dry. He got some over-the-counter Mucinex DM which has helped somewhat. Not aware of any wheezing. No hemoptysis. Denies any appetite or weight changes  Past Medical History  Diagnosis Date  . Hypercholesterolemia     ? statin intolerant. May tolerate Lipitor  . DVT (deep venous thrombosis) ([facility]) 1979    RLE  . Basal cell carcinoma   . Syncope and collapse 8-10 yrs ago  . Bradycardia vasovagal response 10 yrs ago    asymptomatic  . BPH (benign prostatic hyperplasia)   . Normal nuclear stress test May 2011  . Erectile dysfunction   . Diastolic dysfunction     Per echo in 2008  . Obesity   . Pulmonary embolism ([place]) 1979  . Arthritis     "knees, hips" (10/10/2014)  . History of gout    Past Surgical History  Procedure Laterality Date  . Cardiovascular stress test  May 2011    EF 73%; No ischemia  . Mohs surgery  6 yrs     nose  . Colonoscopy  2013  . Knee arthroscopy Right 10/03/2012    Procedure: RIGHT KNEE ARTHROSCOPY WITH DEBRIDEMENT;  Surgeon: [name], MD;  Location: [facility];  Service: Orthopedics;  Laterality: Right;  WITH DEBRIDEMENT  . 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 Joint replacement      reports that he quit smoking about 37 years ago. His smoking use included Cigarettes. He has a 10 pack-year smoking history. He has never used smokeless tobacco. He reports that he drinks about 4.2 oz of alcohol per week. He reports that he does not use illicit drugs. He was adopted. Family history is unknown by  patient. Allergies  Allergen Reactions  . Crestor [Rosuvastatin Calcium]     LEG PAIN       Review of Systems  Constitutional: Negative for fever and chills.  HENT: Negative for congestion.   Respiratory: Positive for cough. Negative for shortness of breath and wheezing.   Cardiovascular: Negative for chest pain.       Objective:   Physical Exam  Constitutional: He appears well-developed and well-nourished.  HENT:  Right Ear: External ear normal.  Left Ear: External ear normal.  Mouth/Throat: Oropharynx is clear and moist.  Neck: Neck supple. No thyromegaly present.  Cardiovascular: Normal rate and regular rhythm.   Pulmonary/Chest: Effort normal and breath sounds normal. No respiratory distress. He has no wheezes. He has no rales.  Lymphadenopathy:    He has no cervical adenopathy.          Assessment & Plan:  Cough. Suspect residual from recent bronchitis. Nonfocal exam. He does not have any worrisome symptoms such as fever or dyspnea. Obtain chest x-ray given duration. Continue Hycodan cough syrup as needed at night. Follow-up promptly for any fever or increased shortness of breath

## 2015-08-07 ENCOUNTER — Ambulatory Visit: Payer: Medicare Other | Admitting: Family Medicine

## 2015-08-10 ENCOUNTER — Emergency Department (HOSPITAL_COMMUNITY)
Admission: EM | Admit: 2015-08-10 | Discharge: 2015-08-10 | Disposition: A | Discharge status: Home / Self Care | Payer: Medicare Other | Source: Home / Self Care | Attending: Emergency Medicine | Admitting: Emergency Medicine

## 2015-08-10 ENCOUNTER — Encounter (HOSPITAL_COMMUNITY): Payer: Self-pay

## 2015-08-10 ENCOUNTER — Encounter (HOSPITAL_COMMUNITY): Payer: Self-pay | Admitting: *Deleted

## 2015-08-10 ENCOUNTER — Observation Stay (HOSPITAL_COMMUNITY)
Admission: EM | Admit: 2015-08-10 | Discharge: 2015-08-13 | Disposition: A | Discharge status: Home / Self Care | Payer: Medicare Other | Attending: Internal Medicine | Admitting: Internal Medicine

## 2015-08-10 DIAGNOSIS — M5442 Lumbago with sciatica, left side: Secondary | ICD-10-CM | POA: Diagnosis not present

## 2015-08-10 DIAGNOSIS — Z86711 Personal history of pulmonary embolism: Secondary | ICD-10-CM | POA: Insufficient documentation

## 2015-08-10 DIAGNOSIS — Z6833 Body mass index (BMI) 33.0-33.9, adult: Secondary | ICD-10-CM | POA: Diagnosis not present

## 2015-08-10 DIAGNOSIS — Z87891 Personal history of nicotine dependence: Secondary | ICD-10-CM | POA: Diagnosis not present

## 2015-08-10 DIAGNOSIS — N400 Enlarged prostate without lower urinary tract symptoms: Secondary | ICD-10-CM | POA: Insufficient documentation

## 2015-08-10 DIAGNOSIS — M545 Low back pain, unspecified: Secondary | ICD-10-CM | POA: Diagnosis present

## 2015-08-10 DIAGNOSIS — R740 Nonspecific elevation of levels of transaminase and lactic acid dehydrogenase [LDH]: Secondary | ICD-10-CM | POA: Insufficient documentation

## 2015-08-10 DIAGNOSIS — Z7982 Long term (current) use of aspirin: Secondary | ICD-10-CM | POA: Diagnosis not present

## 2015-08-10 DIAGNOSIS — M4806 Spinal stenosis, lumbar region: Secondary | ICD-10-CM | POA: Diagnosis not present

## 2015-08-10 DIAGNOSIS — G8929 Other chronic pain: Secondary | ICD-10-CM | POA: Insufficient documentation

## 2015-08-10 DIAGNOSIS — Z79899 Other long term (current) drug therapy: Secondary | ICD-10-CM | POA: Insufficient documentation

## 2015-08-10 DIAGNOSIS — Q7649 Other congenital malformations of spine, not associated with scoliosis: Secondary | ICD-10-CM | POA: Diagnosis not present

## 2015-08-10 DIAGNOSIS — E669 Obesity, unspecified: Secondary | ICD-10-CM | POA: Insufficient documentation

## 2015-08-10 DIAGNOSIS — E7800 Pure hypercholesterolemia, unspecified: Secondary | ICD-10-CM | POA: Diagnosis not present

## 2015-08-10 DIAGNOSIS — Z96641 Presence of right artificial hip joint: Secondary | ICD-10-CM | POA: Insufficient documentation

## 2015-08-10 DIAGNOSIS — M109 Gout, unspecified: Secondary | ICD-10-CM | POA: Insufficient documentation

## 2015-08-10 DIAGNOSIS — M159 Polyosteoarthritis, unspecified: Secondary | ICD-10-CM | POA: Diagnosis not present

## 2015-08-10 DIAGNOSIS — Z86718 Personal history of other venous thrombosis and embolism: Secondary | ICD-10-CM | POA: Diagnosis not present

## 2015-08-10 DIAGNOSIS — Z85828 Personal history of other malignant neoplasm of skin: Secondary | ICD-10-CM | POA: Diagnosis not present

## 2015-08-10 DIAGNOSIS — M5126 Other intervertebral disc displacement, lumbar region: Secondary | ICD-10-CM | POA: Diagnosis not present

## 2015-08-10 DIAGNOSIS — R52 Pain, unspecified: Secondary | ICD-10-CM | POA: Diagnosis not present

## 2015-08-10 DIAGNOSIS — M549 Dorsalgia, unspecified: Secondary | ICD-10-CM | POA: Diagnosis not present

## 2015-08-10 MED ORDER — HYDROMORPHONE HCL 1 MG/ML IJ SOLN
1.0000 mg | Freq: Once | INTRAMUSCULAR | Status: AC
  Administered 2015-08-11: 1 mg via INTRAVENOUS
  Filled 2015-08-10: qty 1

## 2015-08-10 MED ORDER — OXYCODONE-ACETAMINOPHEN 5-325 MG PO TABS: 1.0000 | ORAL_TABLET | Freq: Four times a day (QID) | ORAL | Status: DC | PRN

## 2015-08-10 MED ORDER — PREDNISONE 20 MG PO TABS: 20.0000 mg | ORAL_TABLET | Freq: Every day | ORAL | Status: DC

## 2015-08-10 MED ORDER — ONDANSETRON HCL 4 MG/2ML IJ SOLN
4.0000 mg | Freq: Once | INTRAMUSCULAR | Status: AC
  Administered 2015-08-10: 4 mg via INTRAVENOUS
  Filled 2015-08-10: qty 2

## 2015-08-10 MED ORDER — METHYLPREDNISOLONE SODIUM SUCC 125 MG IJ SOLR
125.0000 mg | Freq: Once | INTRAMUSCULAR | Status: AC
  Administered 2015-08-10: 125 mg via INTRAVENOUS
  Filled 2015-08-10: qty 2

## 2015-08-10 MED ORDER — HYDROMORPHONE HCL 1 MG/ML IJ SOLN
1.0000 mg | Freq: Once | INTRAMUSCULAR | Status: AC
  Administered 2015-08-10: 1 mg via INTRAVENOUS
  Filled 2015-08-10: qty 1

## 2015-08-10 MED ORDER — KETOROLAC TROMETHAMINE 30 MG/ML IJ SOLN
15.0000 mg | Freq: Once | INTRAMUSCULAR | Status: AC
  Administered 2015-08-11: 15 mg via INTRAVENOUS
  Filled 2015-08-10: qty 1

## 2015-08-10 MED ORDER — KETOROLAC TROMETHAMINE 30 MG/ML IJ SOLN
30.0000 mg | Freq: Once | INTRAMUSCULAR | Status: AC
  Administered 2015-08-10: 30 mg via INTRAVENOUS
  Filled 2015-08-10: qty 1

## 2015-08-10 NOTE — ED Provider Notes (Signed)
CSN: [id number]     Arrival date & time 08/10/15  0827 History   First MD Initiated Contact with Patient 08/10/15 0902     Chief Complaint  Patient presents with  . Back Pain     (Consider location/radiation/quality/duration/timing/severity/associated sxs/prior Treatment) Patient is a 69 y.o. male presenting with back pain. The history is provided by the patient (Patient complains of lower back pain radiating down his left leg. This has been a chronic problem for him he has had a couple injections for  It).  Back Pain Location:  Lumbar spine Quality:  Aching Radiates to:  L posterior upper leg Pain severity:  Moderate Onset quality:  Sudden Timing:  Constant Progression:  Worsening Chronicity:  Recurrent Associated symptoms: no abdominal pain, no chest pain and no headaches     Past Medical History  Diagnosis Date  . Hypercholesterolemia     ? statin intolerant. May tolerate Lipitor  . DVT (deep venous thrombosis) ([place]) 1979    RLE  . Basal cell carcinoma   . Syncope and collapse 8-10 yrs ago  . Bradycardia vasovagal response 10 yrs ago    asymptomatic  . BPH (benign prostatic hyperplasia)   . Normal nuclear stress test May 2011  . Erectile dysfunction   . Diastolic dysfunction     Per echo in 2008  . Obesity   . Pulmonary embolism ([place]) 1979  . Arthritis     "knees, hips" (10/10/2014)  . History of gout    Past Surgical History  Procedure Laterality Date  . Cardiovascular stress test  May 2011    EF 73%; No ischemia  . Mohs surgery  6 yrs     nose  . Colonoscopy  2013  . Knee arthroscopy Right 10/03/2012    Procedure: RIGHT KNEE ARTHROSCOPY WITH DEBRIDEMENT;  Surgeon: [name], MD;  Location: [facility];  Service: Orthopedics;  Laterality: Right;  WITH DEBRIDEMENT  . 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 Joint replacement     Family History  Problem Relation Age of Onset  . Adopted: Yes  . Family history unknown: Yes   Social History  Substance Use  Topics  . Smoking status: Former Smoker -- 1.00 packs/day for 10 years    Types: Cigarettes    Quit date: 08/15/1977  . Smokeless tobacco: Never Used  . Alcohol Use: 4.2 oz/week    7 Shots of liquor per week    Review of Systems  Constitutional: Negative for appetite change and fatigue.  HENT: Negative for congestion, ear discharge and sinus pressure.   Eyes: Negative for discharge.  Respiratory: Negative for cough.   Cardiovascular: Negative for chest pain.  Gastrointestinal: Negative for abdominal pain and diarrhea.  Genitourinary: Negative for frequency and hematuria.  Musculoskeletal: Positive for back pain.  Skin: Negative for rash.  Neurological: Negative for seizures and headaches.  Psychiatric/Behavioral: Negative for hallucinations.      Allergies  Crestor  Home Medications   Prior to Admission medications   Medication Sig Start Date End Date Taking? Authorizing Provider  aspirin 81 MG tablet Take 162 mg by mouth every morning.    Yes Historical Provider, MD  fish oil-omega-3 fatty acids 1000 MG capsule Take 1 capsule by mouth daily as needed (when going out to eat).    Yes Historical Provider, MD  ibuprofen (ADVIL,MOTRIN) 200 MG tablet Take 400 mg by mouth every 6 (six) hours as needed for moderate pain.   Yes Historical Provider, MD  Plant Sterol  Stanol-Pantethine (CHOLESTOFF COMPLETE PO) Take 1 tablet by mouth 2 (two) times daily.    Yes Historical Provider, MD  HYDROcodone-homatropine (HYCODAN) 5-1.5 MG/5ML syrup Take 5 mLs by mouth every 6 (six) hours as needed for cough.    Historical Provider, MD  oxyCODONE-acetaminophen (PERCOCET) 5-325 MG tablet Take 1 tablet by mouth every 6 (six) hours as needed for moderate pain. 08/10/15   Milton Ferguson, MD  predniSONE (DELTASONE) 20 MG tablet Take 1 tablet (20 mg total) by mouth daily. 08/10/15   Milton Ferguson, MD   BP 159/73 mmHg  Pulse 58  Temp(Src) 98 F (36.7 C) (Oral)  Resp 14  SpO2 96% Physical Exam   Constitutional: He is oriented to person, place, and time. He appears well-developed.  HENT:  Head: Normocephalic.  Eyes: Conjunctivae and EOM are normal. No scleral icterus.  Neck: Neck supple. No thyromegaly present.  Cardiovascular: Normal rate and regular rhythm.  Exam reveals no gallop and no friction rub.   No murmur heard. Pulmonary/Chest: No stridor. He has no wheezes. He has no rales. He exhibits no tenderness.  Abdominal: He exhibits no distension. There is no tenderness. There is no rebound.  Musculoskeletal: Normal range of motion. He exhibits no edema.  Tender lumbar spine with positive straight-leg raise on left  Lymphadenopathy:    He has no cervical adenopathy.  Neurological: He is oriented to person, place, and time. He exhibits normal muscle tone. Coordination normal.  Skin: No rash noted. No erythema.  Psychiatric: He has a normal mood and affect. His behavior is normal.    ED Course  Procedures (including critical care time) Labs Review Labs Reviewed - No data to display  Imaging Review No results found. I have personally reviewed and evaluated these images and lab results as part of my medical decision-making.   EKG Interpretation None      MDM   Final diagnoses:  Back pain at L4-L5 level    Back pain. History of ruptured disc. Patients symptoms improved with pain medicine he will be sent home on steroids and pain medicine    Milton Ferguson, MD 08/10/15 1137

## 2015-08-10 NOTE — ED Notes (Signed)
Bed: [id number] Expected date:  Expected time:  Means of arrival:  Comments: Ems-69yo back pain

## 2015-08-10 NOTE — ED Notes (Signed)
Per EMS, pt from home.  Pt c/o lower back pain.  Pt was in bed at 5am. Sharp pain.  Hx of 2 slipped disc.  No recent injury or increase in activity noted.  Denies numbness/tingling. Pain radiates down leg.  Vitals: 146/96, hr 70, resp 18

## 2015-08-10 NOTE — Discharge Instructions (Signed)
Follow up with your md later this week. °

## 2015-08-10 NOTE — ED Provider Notes (Signed)
CSN: [id number]     Arrival date & time 08/10/15  2029 History   By signing my name below, I, Joe Davis, attest that this documentation has been prepared under the direction and in the presence of Davonna Belling, MD. Electronically Signed: Judithann Sauger, ED Scribe. 08/10/2015. 11:58 PM.    Chief Complaint  Patient presents with  . Back Pain   The history is provided by the patient. No language interpreter was used.   HPI Comments: Joe Davis is a 69 y.o. male with a hx of chronic back pain who presents to the Emergency Department complaining of gradually worsening back pain that radiates down his leg toward his toes onset this am. He reports associated difficulty ambulating secondary to pain. He explains that he received an injection from his PCP at [facility] a month ago and had relief until this am. He reports that he was seen here this am and given oxycodone and Prednisone but denies any relief with these. He denies any rash, numbness, bowel/bladder incontinence, or any urinary symptoms.   PCP: Dr. Yvette Rack   Past Medical History  Diagnosis Date  . Hypercholesterolemia     ? statin intolerant. May tolerate Lipitor  . DVT (deep venous thrombosis) ([place]) 1979    RLE  . Basal cell carcinoma   . Syncope and collapse 8-10 yrs ago  . Bradycardia vasovagal response 10 yrs ago    asymptomatic  . BPH (benign prostatic hyperplasia)   . Normal nuclear stress test May 2011  . Erectile dysfunction   . Diastolic dysfunction     Per echo in 2008  . Obesity   . Pulmonary embolism ([place]) 1979  . Arthritis     "knees, hips" (10/10/2014)  . History of gout    Past Surgical History  Procedure Laterality Date  . Cardiovascular stress test  May 2011    EF 73%; No ischemia  . Mohs surgery  6 yrs     nose  . Colonoscopy  2013  . Knee arthroscopy Right 10/03/2012    Procedure: RIGHT KNEE ARTHROSCOPY WITH DEBRIDEMENT;  Surgeon: [name], MD;  Location: [facility];  Service:  Orthopedics;  Laterality: Right;  WITH DEBRIDEMENT  . 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 Joint replacement     Family History  Problem Relation Age of Onset  . Adopted: Yes  . Family history unknown: Yes   Social History  Substance Use Topics  . Smoking status: Former Smoker -- 1.00 packs/day for 10 years    Types: Cigarettes    Quit date: 08/15/1977  . Smokeless tobacco: Never Used  . Alcohol Use: 4.2 oz/week    7 Shots of liquor per week    Review of Systems  Genitourinary: Negative for dysuria and hematuria.  Musculoskeletal: Positive for back pain, arthralgias and gait problem.  Skin: Negative for rash.  All other systems reviewed and are negative.     Allergies  Crestor  Home Medications   Prior to Admission medications   Medication Sig Start Date End Date Taking? Authorizing Provider  aspirin 81 MG tablet Take 162 mg by mouth every morning.    Yes Historical Provider, MD  fish oil-omega-3 fatty acids 1000 MG capsule Take 1 capsule by mouth daily as needed (when going out to eat).    Yes Historical Provider, MD  ibuprofen (ADVIL,MOTRIN) 200 MG tablet Take 400 mg by mouth every 6 (six) hours as needed for moderate pain.   Yes Historical Provider, MD  oxyCODONE-acetaminophen (PERCOCET) 5-325 MG tablet Take 1 tablet by mouth every 6 (six) hours as needed for moderate pain. 08/10/15  Yes Milton Ferguson, MD  Plant Sterol Stanol-Pantethine (CHOLESTOFF COMPLETE PO) Take 1 tablet by mouth 2 (two) times daily.    Yes Historical Provider, MD  predniSONE (DELTASONE) 20 MG tablet Take 2 tablets (40 mg total) by mouth daily. 08/13/15   Modena Jansky, MD   BP 147/74 mmHg  Pulse 57  Temp(Src) 97.3 F (36.3 C) (Oral)  Resp 18  Ht 5\' 10"  (1.778 m)  Wt 236 lb 14.4 oz (107.457 kg)  BMI 33.99 kg/m2  SpO2 96% Physical Exam  Constitutional: He is oriented to person, place, and time. He appears well-developed and well-nourished. No distress.  Pt looks uncomfortable  HENT:   Head: Normocephalic and atraumatic.  Eyes: Conjunctivae and EOM are normal.  Neck: Neck supple. No tracheal deviation present.  Cardiovascular: Normal rate.   Pulmonary/Chest: Effort normal. No respiratory distress.  Abdominal: There is no tenderness.  Musculoskeletal: Normal range of motion.  Decrease of motion of left lower leg due to pain Paraspinal to dorsum of left foot Good flexion and extension to great toe There may be decreased dorsiflexion of ankle but due to pain.   Neurological: He is alert and oriented to person, place, and time.  Skin: Skin is warm and dry.  Psychiatric: He has a normal mood and affect. His behavior is normal.  Nursing note and vitals reviewed.   ED Course  Procedures (including critical care time) DIAGNOSTIC STUDIES: Oxygen Saturation is 95% on RA, adequate by my interpretation.    COORDINATION OF CARE: 11:56 PM- Pt advised of plan for treatment and pt agrees. Pt will receive Dilaudid and Toradol.    Labs Review Labs Reviewed  BASIC METABOLIC PANEL - Abnormal; Notable for the following:    Glucose, Bld 142 (*)    BUN 25 (*)    All other components within normal limits  CBC - Abnormal; Notable for the following:    WBC 11.5 (*)    All other components within normal limits  LACTIC ACID, PLASMA - Abnormal; Notable for the following:    Lactic Acid, Venous 2.8 (*)    All other components within normal limits  I-STAT CHEM 8, ED - Abnormal; Notable for the following:    BUN 26 (*)    Glucose, Bld 161 (*)    All other components within normal limits  I-STAT CG4 LACTIC ACID, ED - Abnormal; Notable for the following:    Lactic Acid, Venous 3.33 (*)    All other components within normal limits  CBC WITH DIFFERENTIAL/PLATELET  LACTIC ACID, PLASMA  LACTIC ACID, PLASMA    Imaging Review No results found.   Davonna Belling, MD has personally reviewed and evaluated these images and lab results as part of his medical decision-making.   EKG  Interpretation None      MDM   Final diagnoses:  Left-sided low back pain with left-sided sciatica    Patient with back pain. Has had recent injections. Lactic acid done in error by the lab and was mildly elevated. Continued pain. Will admit to internal medicine.  I personally performed the services described in this documentation, which was scribed in my presence. The recorded information has been reviewed and is accurate.      Davonna Belling, MD 08/21/15 1538

## 2015-08-10 NOTE — ED Notes (Addendum)
Pt's girlfriend called and advised that she would like a MRI due to his increase pain and if we cannot do that then we need to call the ambulance to take him to [facility]. RN advised that he would have to see the ER physician and it would be up to them to make the orders; RN advised that I cannot transfer a pt to [place] via EMS due to EMTALA and that if pt wants to go to [place] himself he could; Pt's girlfriend advises that she is a Immunologist here at [facility] and insists that the patient be ordered a MRI and be admitted until he can get to [facility]. Fara Boros [phone] - contact with any questions

## 2015-08-10 NOTE — ED Notes (Addendum)
Pt states that he has chronic back pain; pt states that he was seen at [facility] 6 months ago and was given and injection and states "something went wrong"; pt states that he was seen this morning and was given IV pain medication and steroids; pt states that he was sent home with Oxy and Prednisone but that he is not getting relief; pt states that he would like to be admitted for pain control and would like an appointment in the morning; pt advises that he only gets relief when he is sitting down; pt denies numbness; c/o difficulty ambulating due to pain

## 2015-08-11 ENCOUNTER — Observation Stay (HOSPITAL_COMMUNITY): Payer: Medicare Other

## 2015-08-11 ENCOUNTER — Encounter (HOSPITAL_COMMUNITY): Payer: Self-pay | Admitting: Internal Medicine

## 2015-08-11 DIAGNOSIS — M545 Low back pain, unspecified: Secondary | ICD-10-CM | POA: Diagnosis present

## 2015-08-11 DIAGNOSIS — M5442 Lumbago with sciatica, left side: Secondary | ICD-10-CM | POA: Diagnosis not present

## 2015-08-11 DIAGNOSIS — M549 Dorsalgia, unspecified: Secondary | ICD-10-CM | POA: Insufficient documentation

## 2015-08-11 DIAGNOSIS — M5127 Other intervertebral disc displacement, lumbosacral region: Secondary | ICD-10-CM | POA: Diagnosis not present

## 2015-08-11 LAB — CBC WITH DIFFERENTIAL/PLATELET
Basophils Absolute: 0 10*3/uL (ref 0.0–0.1)
Basophils Relative: 0 %
EOS PCT: 0 %
Eosinophils Absolute: 0 10*3/uL (ref 0.0–0.7)
HCT: 45.6 % (ref 39.0–52.0)
Hemoglobin: 15.4 g/dL (ref 13.0–17.0)
LYMPHS PCT: 13 %
Lymphs Abs: 1.1 10*3/uL (ref 0.7–4.0)
MCH: 30.7 pg (ref 26.0–34.0)
MCHC: 33.8 g/dL (ref 30.0–36.0)
MCV: 91 fL (ref 78.0–100.0)
MONO ABS: 0.3 10*3/uL (ref 0.1–1.0)
Monocytes Relative: 3 %
Neutro Abs: 7.4 10*3/uL (ref 1.7–7.7)
Neutrophils Relative %: 84 %
Platelets: 228 10*3/uL (ref 150–400)
RBC: 5.01 MIL/uL (ref 4.22–5.81)
RDW: 13.5 % (ref 11.5–15.5)
WBC: 8.8 10*3/uL (ref 4.0–10.5)

## 2015-08-11 LAB — I-STAT CG4 LACTIC ACID, ED: Lactic Acid, Venous: 3.33 mmol/L (ref 0.5–2.0)

## 2015-08-11 LAB — BASIC METABOLIC PANEL
ANION GAP: 8 (ref 5–15)
BUN: 25 mg/dL — ABNORMAL HIGH (ref 6–20)
CALCIUM: 8.9 mg/dL (ref 8.9–10.3)
CO2: 26 mmol/L (ref 22–32)
Chloride: 105 mmol/L (ref 101–111)
Creatinine, Ser: 0.84 mg/dL (ref 0.61–1.24)
GLUCOSE: 142 mg/dL — AB (ref 65–99)
Potassium: 4.5 mmol/L (ref 3.5–5.1)
SODIUM: 139 mmol/L (ref 135–145)

## 2015-08-11 LAB — LACTIC ACID, PLASMA
LACTIC ACID, VENOUS: 2.8 mmol/L — AB (ref 0.5–2.0)
LACTIC ACID, VENOUS: 1 mmol/L (ref 0.5–2.0)
LACTIC ACID, VENOUS: 1.3 mmol/L (ref 0.5–2.0)

## 2015-08-11 LAB — I-STAT CHEM 8, ED
BUN: 26 mg/dL — ABNORMAL HIGH (ref 6–20)
CREATININE: 0.7 mg/dL (ref 0.61–1.24)
Calcium, Ion: 1.19 mmol/L (ref 1.13–1.30)
Chloride: 106 mmol/L (ref 101–111)
GLUCOSE: 161 mg/dL — AB (ref 65–99)
HCT: 47 % (ref 39.0–52.0)
Hemoglobin: 16 g/dL (ref 13.0–17.0)
POTASSIUM: 4.4 mmol/L (ref 3.5–5.1)
Sodium: 140 mmol/L (ref 135–145)
TCO2: 21 mmol/L (ref 0–100)

## 2015-08-11 LAB — CBC
HCT: 42.9 % (ref 39.0–52.0)
HEMOGLOBIN: 14.5 g/dL (ref 13.0–17.0)
MCH: 30.8 pg (ref 26.0–34.0)
MCHC: 33.8 g/dL (ref 30.0–36.0)
MCV: 91.1 fL (ref 78.0–100.0)
Platelets: 228 10*3/uL (ref 150–400)
RBC: 4.71 MIL/uL (ref 4.22–5.81)
RDW: 13.6 % (ref 11.5–15.5)
WBC: 11.5 10*3/uL — AB (ref 4.0–10.5)

## 2015-08-11 MED ORDER — ONDANSETRON HCL 4 MG PO TABS: 4.0000 mg | ORAL_TABLET | Freq: Four times a day (QID) | ORAL | Status: DC | PRN

## 2015-08-11 MED ORDER — ONDANSETRON HCL 4 MG/2ML IJ SOLN: 4.0000 mg | Freq: Four times a day (QID) | INTRAMUSCULAR | Status: DC | PRN

## 2015-08-11 MED ORDER — HYDROMORPHONE HCL 1 MG/ML IJ SOLN
1.0000 mg | INTRAMUSCULAR | Status: DC | PRN
  Administered 2015-08-11 (×2): 1 mg via INTRAVENOUS
  Filled 2015-08-11 (×2): qty 1

## 2015-08-11 MED ORDER — KETOROLAC TROMETHAMINE 30 MG/ML IJ SOLN
30.0000 mg | Freq: Four times a day (QID) | INTRAMUSCULAR | Status: DC
  Administered 2015-08-11 – 2015-08-12 (×4): 30 mg via INTRAVENOUS
  Filled 2015-08-11 (×7): qty 1

## 2015-08-11 MED ORDER — SODIUM CHLORIDE 0.9 % IV BOLUS (SEPSIS)
500.0000 mL | Freq: Once | INTRAVENOUS | Status: AC
  Administered 2015-08-11: 500 mL via INTRAVENOUS

## 2015-08-11 MED ORDER — PREDNISONE 20 MG PO TABS
40.0000 mg | ORAL_TABLET | Freq: Every day | ORAL | Status: DC
  Administered 2015-08-12 – 2015-08-13 (×2): 40 mg via ORAL
  Filled 2015-08-11 (×3): qty 2

## 2015-08-11 MED ORDER — OMEGA-3-ACID ETHYL ESTERS 1 G PO CAPS
1.0000 | ORAL_CAPSULE | Freq: Every day | ORAL | Status: DC | PRN
  Filled 2015-08-11: qty 1

## 2015-08-11 MED ORDER — HYDROMORPHONE HCL 1 MG/ML IJ SOLN
1.0000 mg | Freq: Once | INTRAMUSCULAR | Status: AC
  Administered 2015-08-11: 1 mg via INTRAVENOUS
  Filled 2015-08-11: qty 1

## 2015-08-11 MED ORDER — IBUPROFEN 200 MG PO TABS
400.0000 mg | ORAL_TABLET | Freq: Four times a day (QID) | ORAL | Status: DC | PRN
  Administered 2015-08-11: 400 mg via ORAL
  Filled 2015-08-11: qty 2

## 2015-08-11 MED ORDER — ACETAMINOPHEN 650 MG RE SUPP: 650.0000 mg | Freq: Four times a day (QID) | RECTAL | Status: DC | PRN

## 2015-08-11 MED ORDER — ACETAMINOPHEN 325 MG PO TABS
650.0000 mg | ORAL_TABLET | Freq: Four times a day (QID) | ORAL | Status: DC | PRN
  Administered 2015-08-13 (×2): 650 mg via ORAL
  Filled 2015-08-11 (×2): qty 2

## 2015-08-11 MED ORDER — ENOXAPARIN SODIUM 40 MG/0.4ML ~~LOC~~ SOLN
40.0000 mg | SUBCUTANEOUS | Status: DC
  Administered 2015-08-11 – 2015-08-12 (×2): 40 mg via SUBCUTANEOUS
  Filled 2015-08-11 (×3): qty 0.4

## 2015-08-11 NOTE — H&P (Signed)
[facility] History and Physical  Joe Davis [id number] DOB: [birth year] DOA: 08/10/2015  Referring physician: Dr.Pickering. PCP: [name], MD  Specialists: Dr.Gottfried. Neurosurgeon.  Chief Complaint: Low back pain.  HPI: Joe Davis is a 69 y.o. 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 Last 24 hours patient's pain has worsened with low back pain radiating to his left lower extremity. Patient did not have any incontinence of urine or bowels. Denies any fall. Did move his trash can. He had come to the ER initially and was prescribed oral pain medications and prednisone despite the image patient's pain still continued to worsen. At this time patient has been admitted for further management of this acute pain and further assessment.   Review of Systems: As presented in the history of presenting illness, rest negative.  Past Medical History  Diagnosis Date  . Hypercholesterolemia     ? statin intolerant. May tolerate Lipitor  . DVT (deep venous thrombosis) ([place]) 1979    RLE  . Basal cell carcinoma   . Syncope and collapse 8-10 yrs ago  . Bradycardia vasovagal response 10 yrs ago    asymptomatic  . BPH (benign prostatic hyperplasia)   . Normal nuclear stress test May 2011  . Erectile dysfunction   . Diastolic dysfunction     Per echo in 2008  . Obesity   . Pulmonary embolism ([place]) 1979  . Arthritis     "knees, hips" (10/10/2014)  . History of gout    Past Surgical History  Procedure Laterality Date  . Cardiovascular stress test  May 2011    EF 73%; No ischemia  . Mohs surgery  6 yrs     nose  . Colonoscopy  2013  . Knee arthroscopy Right 10/03/2012    Procedure: RIGHT KNEE ARTHROSCOPY WITH DEBRIDEMENT;  Surgeon: [name], MD;  Location: [facility];  Service:  Orthopedics;  Laterality: Right;  WITH DEBRIDEMENT  . 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 Joint replacement     Social History:  reports that he quit smoking about 38 years ago. His smoking use included Cigarettes. He has a 10 pack-year smoking history. He has never used smokeless tobacco. He reports that he drinks about 4.2 oz of alcohol per week. He reports that he does not use illicit drugs. Where does patient live at home. Can patient participate in ADLs? Yes.  Allergies  Allergen Reactions  . Crestor [Rosuvastatin Calcium]     LEG PAIN    Family History:  Family History  Problem Relation Age of Onset  . Adopted: Yes  . Family history unknown: Yes      Prior to Admission medications   Medication Sig Start Date End Date Taking? Authorizing Provider  aspirin 81 MG tablet Take 162 mg by mouth every morning.    Yes Historical Provider, MD  fish oil-omega-3 fatty acids 1000 MG capsule Take 1 capsule by mouth daily as needed (when going out to eat).    Yes Historical Provider, MD  ibuprofen (ADVIL,MOTRIN) 200 MG tablet Take 400 mg by mouth every 6 (six) hours as needed for moderate pain.   Yes Historical Provider, MD  oxyCODONE-acetaminophen (PERCOCET) 5-325 MG tablet Take 1 tablet by mouth every 6 (six) hours as needed for moderate pain. 08/10/15  Yes Milton Ferguson, MD  Plant Sterol Stanol-Pantethine (CHOLESTOFF COMPLETE PO) Take 1 tablet by mouth 2 (two) times daily.    Yes Historical Provider, MD  predniSONE (DELTASONE) 20 MG tablet Take 1 tablet (20 mg total) by mouth daily. 08/10/15  Yes Milton Ferguson, MD  HYDROcodone-homatropine [facility]) 5-1.5 MG/5ML syrup Take 5 mLs by mouth every 6 (six) hours as needed for cough.    Historical Provider, MD    Physical Exam: Filed Vitals:   08/10/15 2105 08/10/15 2350 08/11/15 0356  BP: 193/91 160/81 138/66  Pulse: 102 84 92  Temp: 98.1 F (36.7 C)    TempSrc: Oral    Resp: 18 18 20   SpO2: 95% 99% 96%     General:  Moderately  built and nourished.  Eyes: Anicteric no pallor.  ENT: No discharge from the ears eyes nose and mouth.  Neck: No mass felt.  Cardiovascular: S1 and S2 heard.  Respiratory: No rhonchi or crepitations.  Abdomen: Soft nontender bowel sounds present.  Skin: No rash.  Musculoskeletal: No edema. Pain on moving his left lower extremity. Has good pulses.  Psychiatric: Appears normal.  Neurologic: Alert awake oriented to time place and person. Moves all extremities 5 x 5. Patient has pain on moving his left lower extremity.  Labs on Admission:  Basic Metabolic Panel:  Recent Labs Lab 08/11/15 0022  NA 140  K 4.4  CL 106  GLUCOSE 161*  BUN 26*  CREATININE 0.70   Liver Function Tests: No results for input(s): AST, ALT, ALKPHOS, BILITOT, PROT, ALBUMIN in the last 168 hours. No results for input(s): LIPASE, AMYLASE in the last 168 hours. No results for input(s): AMMONIA in the last 168 hours. CBC:  Recent Labs Lab 08/11/15 0009 08/11/15 0022  WBC 8.8  --   NEUTROABS 7.4  --   HGB 15.4 16.0  HCT 45.6 47.0  MCV 91.0  --   PLT 228  --    Cardiac Enzymes: No results for input(s): CKTOTAL, CKMB, CKMBINDEX, TROPONINI in the last 168 hours.  BNP (last 3 results) No results for input(s): BNP in the last 8760 hours.  ProBNP (last 3 results) No results for input(s): PROBNP in the last 8760 hours.  CBG: No results for input(s): GLUCAP in the last 168 hours.  Radiological Exams on Admission: No results found.   Assessment/Plan Principal Problem:   Low back pain   1. 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 Patient was managed by neurosurgeon at [facility] Dr. Marcos Eke. At this time on my exam patient's pain has improved with second dose of IV narcotics. Patient has been admitted for MRI L-spine and patient has requested orthopedic consult with Dr. Maxie Better. Please consult  Dr. Maxie Better in a.m. Physical therapy consult. 2. Previous history of PE in the 67s.  DVT Prophylaxis- SCDs in anticipation of procedure.  Code Status:  Full code.  Family Communication:  Discussed with patient.  Disposition Plan:  Admit for observation.    KAKRAKANDY,ARSHAD N. [facility] Pager [phone].  If 7PM-7AM, please contact night-coverage [email or web address] Password [email or web address] 08/11/2015, 4:18 AM

## 2015-08-11 NOTE — Progress Notes (Addendum)
CRITICAL VALUE ALERT  Critical value received:  Lactic Acid 2.8  Date of notification:  08/11/2015  Time of notification:  [id number]  Critical value read back: yes  Nurse who received alert:  Mannie Stabile RN  MD notified (1st page):  yes  Time of first page:  1154  MD notified (2nd page):  Time of second page:  Responding MD:    Time MD responded:

## 2015-08-11 NOTE — ED Notes (Signed)
Attempt to call report x 1 RN's phone is busy. Tried twice [phone])

## 2015-08-11 NOTE — Progress Notes (Addendum)
[facility] PROGRESS NOTE  Joe Davis [id number] DOB: 27-Feb-1946 DOA: 08/10/2015 PCP: [name], MD  Brief narrative 69 year old male with chronic lumbar back pain who receives epidural injections with Dr Romelle Starcher at [facility] (last injection with 3 months ago) and also sees neurosurgeon Dr. Maryellen Pile at [facility] presented with acute severe pain in his lower back radiating to his right leg. Symptoms started 2-3 days back and he was unable to ambulate properly. He came to the ED where he was given pain medications and steroid and was discharged home. However patient's pain was persistent and worsened and return back to the hospital. Admission admitted for further management. An MRI of the lumbar spine was done which showed L5-S1 quadrant disc extrusion with sequestered disc fragment in the left lateral recess which is new since his MRI in 2008. Also suggests severe left lateral recess stenosis and multifactorial severe spinal stenosis at this level. Upon reviewing his MRI results from September 2016 done at [facility], it commented that he had increase in size of L4-L5 disc protrusion with extruded complement in the left lateral recess impinging on the descending left L5 nerve root. It did not comment on L5-S1 disc protrusion.  Assessment/Plan: Acute on chronic low back pain with lumbar disc protrusion MRI shows L5-S1 disc protrusion with severe lateral recess stenosis. I have faxed the MRI result to Dr. Raeanne Gathers office.Marland Kitcheni was notified from his office that he reviewed the results with his PA and did not appear to have new findings on MRI.    I have also been notified that he would be back in office tomorrow and I would speak with him regarding this further. Patient does not have any urinary retention or bowel incontinence. He does have low back pain radiating down to his legs with occasional numbness. With pain medication she has been able to move his leg better and is interested in  physical therapy. -I will continue him on when necessary Dilaudid 1 mg every 3 hours and add scheduled Toradol. i will continue prednisone. -Continue to monitor for any change in neurological function. I will speak with his neurosurgeon in the morning. -patient wanted to be seen by orthopedic surgeon Dr Tonita Cong who he knows personally. i have called his and left a message.   Elevated lactic acid Possibly due to acute stress. Slowly improving. Monitor.     DVT prophylaxis: Subcutaneous Lovenox  Code Status: full code Family Communication: None at bedside  Disposition Plan:pending clinical improvement.  consultants:  none  Procedures:  MRI L spine    Antibiotics: None  HPI/Subjective: admission H&P reviewed. Patient completed his left lower back to reoccur after in medication wears off . Dental pain radiating down to his legs and occasionally feeling numb.  Objective: Filed Vitals:   08/11/15 0447 08/11/15 1406  BP: 163/83 139/62  Pulse: 59   Temp: 97.6 F (36.4 C) 98 F (36.7 C)  Resp: 18 18   No intake or output data in the 24 hours ending 08/11/15 1548 Filed Weights   08/11/15 0600  Weight: 107.457 kg (236 lb 14.4 oz)    Exam:   General:   moist mucosa  Chest: Clear bilaterally  CVS: Normal S1 and S2, no murmurs  GI: Soft, nondistended, nontender, bowel sounds present  Musculoskeletal: Warm, tenderness to pressure over lower back with limited ROM of the left leg , normal sensation in left leg   CNS: Alert and oriented   Data Reviewed: Basic Metabolic Panel:  Recent Labs  Lab 08/11/15 0022 08/11/15 0835  NA 140 139  K 4.4 4.5  CL 106 105  CO2  --  26  GLUCOSE 161* 142*  BUN 26* 25*  CREATININE 0.70 0.84  CALCIUM  --  8.9   Liver Function Tests: No results for input(s): AST, ALT, ALKPHOS, BILITOT, PROT, ALBUMIN in the last 168 hours. No results for input(s): LIPASE, AMYLASE in the last 168 hours. No results for input(s): AMMONIA in the  last 168 hours. CBC:  Recent Labs Lab 08/11/15 0009 08/11/15 0022 08/11/15 0835  WBC 8.8  --  11.5*  NEUTROABS 7.4  --   --   HGB 15.4 16.0 14.5  HCT 45.6 47.0 42.9  MCV 91.0  --  91.1  PLT 228  --  228   Cardiac Enzymes: No results for input(s): CKTOTAL, CKMB, CKMBINDEX, TROPONINI in the last 168 hours. BNP (last 3 results) No results for input(s): BNP in the last 8760 hours.  ProBNP (last 3 results) No results for input(s): PROBNP in the last 8760 hours.  CBG: No results for input(s): GLUCAP in the last 168 hours.  No results found for this or any previous visit (from the past 240 hour(s)).   Studies: Mr Lumbar Spine Wo Contrast  08/11/2015  CLINICAL DATA:  69 year old male with acute lumbar back pain radiating down the left lower extremity. Initial encounter. EXAM: MRI LUMBAR SPINE WITHOUT CONTRAST TECHNIQUE: Multiplanar, multisequence MR imaging of the lumbar spine was performed. No intravenous contrast was administered. COMPARISON:  Lumbar MRI 01/30/2007. FINDINGS: Transitional anatomy. Same numbering system as in 2008. Stable vertebral height and alignment. Mild grade 1 anterolisthesis at L5-S1. No marrow edema or evidence of acute osseous abnormality. Visualized lower thoracic spinal cord is normal with conus medularis at L1-L2. Visualized abdominal viscera and paraspinal soft tissues are within normal limits. T11-T12: Stable to decreased mild circumferential disc bulge. Mild facet hypertrophy. T12-L1:  Increased mild disc bulge. L1-L2: Stable small right paracentral disc protrusion. Borderline to mild right lateral recess stenosis is unchanged. L2-L3:  Stable mild facet hypertrophy. L3-L4: Interval mild disc desiccation and circumferential disc bulge. Mild to moderate facet hypertrophy has progressed. Still, no significant stenosis. L4-L5: Interval disc desiccation. Increased circumferential disc bulge. Increased moderate chronic ligament flavum and moderate to severe facet  hypertrophy. Increased epidural lipomatosis. Borderline to mild spinal stenosis. L5-S1: Chronic disc space loss and circumferential disc osteophyte complex. New caudal left lateral recess disc extrusion with sequestered disc fragment measuring 12 mm (series 4, image 10 and series 7, image 35. Moderate facet and ligament flavum hypertrophy at this level is mildly increased. Epidural lipomatosis has increased. Combined there is new severe spinal and left lateral recess stenosis (descending left S1 nerve root level). No left L5 foraminal involvement. Mild right L5 foraminal stenosis is stable. S1-S2: Vestigial disc space. Epidural lipomatosis. Otherwise negative. IMPRESSION: 1. L5-S1 caudal disc extrusion with sequestered disc fragment in the left lateral recess, new since 2008. Severe left lateral recess stenosis and multifactorial severe spinal stenosis at this level. 2. Transitional anatomy. Correlation with radiographs is recommended prior to any operative intervention. 3. Mildly progressed lumbar spine degeneration elsewhere since 2008. Borderline to mild spinal stenosis now at L4-L5. Electronically Signed   By: Genevie Ann M.D.   On: 08/11/2015 07:17    Scheduled Meds: . ketorolac  30 mg Intravenous 4 times per day   Continuous Infusions:    Time spent: 20 minutes    Joe Davis, [place]  [facility] Pager [phone]. If  7PM-7AM, please contact night-coverage at [email or web address], password [facility] 08/11/2015, 3:48 PM

## 2015-08-11 NOTE — Progress Notes (Signed)
PT Cancellation Note  Patient Details Name: BRANCE HEIDELBERG MRN: [id number] DOB: 1946-05-12   Cancelled Treatment:    Reason Eval/Treat Not Completed: Other (comment) Pt admitted with acute on chronic back pain.  Will await ortho consult.   Jalea Bronaugh,KATHrine E 08/11/2015, 10:52 AM Carmelia Bake, PT, DPT 08/11/2015 Pager: [phone]

## 2015-08-12 DIAGNOSIS — M545 Low back pain: Secondary | ICD-10-CM

## 2015-08-12 DIAGNOSIS — M5442 Lumbago with sciatica, left side: Secondary | ICD-10-CM | POA: Diagnosis not present

## 2015-08-12 MED ORDER — ASPIRIN 81 MG PO CHEW
162.0000 mg | CHEWABLE_TABLET | Freq: Every morning | ORAL | Status: DC
  Administered 2015-08-13: 162 mg via ORAL
  Filled 2015-08-12: qty 2

## 2015-08-12 MED ORDER — OXYCODONE-ACETAMINOPHEN 5-325 MG PO TABS: 1.0000 | ORAL_TABLET | Freq: Four times a day (QID) | ORAL | Status: DC | PRN

## 2015-08-12 MED ORDER — HYDROMORPHONE HCL 1 MG/ML IJ SOLN: 1.0000 mg | INTRAMUSCULAR | Status: DC | PRN

## 2015-08-12 NOTE — Progress Notes (Signed)
PROGRESS NOTE    Joe Davis [id number] DOB: 06-11-1946 DOA: 08/10/2015 PCP: [name], MD  HPI/Brief narrative 69 year old male with chronic lumbar back pain who receives epidural injections with Dr Romelle Starcher at [facility] (last injection with 3 months ago) and also sees neurosurgeon Dr. Maryellen Pile at [facility] presented with acute/subacute onset of severe left lower back pain radiating to his left leg that started 2 days prior to admission. He denies trauma, fall or unusual heavy lifting. He did however have to do a lot of house cleaning because his dog had vomited at home. He was seen in ED, discharged home on steroids and pain medications. His pain had significantly subsided after a cocktail he received in ED. However later that night, he started having worsening pain and returned to the hospital for admission. MRI results are as below. Attempting to contact patient's neurosurgeon at [facility] regarding decision for further evaluation, follow-up and management.   Assessment/Plan:  Acute on chronic low back pain - MRI lumbar spine results are as below. - Dr. Clementeen Graham asked the MRI results to Dr. Raeanne Gathers office on 12/27 and Dr. Clementeen Graham was notified that Dr. Marcos Eke had reviewed the results with his PA and did not appear to have new findings on MRI. - No sphincter disturbances. Patient does have pain on admission that radiated to left lower extremity which has improved and now only involves left lateral leg. He denies left leg weakness. Symptoms may be radicular. - DD: Spinal stenosis, degenerative disc disease - I have personally attempted to contact Dr. Marcos Eke multiple times today and have discussed and left a message with his staff to contact me on my cell phone number. - Patient wishes to try backing off on pain medications and see how he does since his pain has improved. We will discontinue scheduled IV Toradol and change his pain medications to when necessary. Continue  prednisone. - PT evaluated and recommended no PT follow-up. - Possible DC home in a.m. with close outpatient follow-up with his neurosurgeon/spine surgeons at [facility]. - Patient has been constantly updated regarding his care including attempts to contact his neurosurgeon.  Elevated lactate - Unclear etiology but has resolved.  Remote history of pulmonary embolism/DVT in the 70s   DVT prophylaxis: Lovenox Code Status: Full Family Communication: None at bedside Disposition Plan: DC home possibly 12/29   Consultants:  None  Procedures:  None  Antibiotics:  None   Subjective: Left lower back pain has significantly improved. Was able to ambulate with PT. Able to bring on left lateral leg pain with certain positioning of left lower extremity or after ambulating. No sphincter disturbances. Overall feels much better compared to admission.  Objective: Filed Vitals:   08/11/15 1406 08/11/15 2100 08/12/15 0610 08/12/15 1356  BP: 139/62 161/72 146/72 159/80  Pulse:  58 58 68  Temp: 98 F (36.7 C) 98.4 F (36.9 C) 98.2 F (36.8 C) 98.6 F (37 C)  TempSrc: Oral Oral Oral Oral  Resp: 18 18 18 19   Height:      Weight:      SpO2: 96% 97% 97% 97%    Intake/Output Summary (Last 24 hours) at 08/12/15 1659 Last data filed at 08/12/15 1300  Gross per 24 hour  Intake    480 ml  Output      0 ml  Net    480 ml   Filed Weights   08/11/15 0600  Weight: 107.457 kg (236 lb 14.4 oz)     Exam:  General exam: Pleasant elderly male sitting up comfortably in bed this morning. Respiratory system: Clear. No increased work of breathing. Cardiovascular system: S1 & S2 heard, RRR. No JVD, murmurs, gallops, clicks or pedal edema. Gastrointestinal system: Abdomen is nondistended, soft and nontender. Normal bowel sounds heard. Central nervous system: Alert and oriented. No focal neurological deficits. Extremities: Symmetric 5 x 5 power.   Data Reviewed: Basic Metabolic Panel:  Recent  Labs Lab 08/11/15 0022 08/11/15 0835  NA 140 139  K 4.4 4.5  CL 106 105  CO2  --  26  GLUCOSE 161* 142*  BUN 26* 25*  CREATININE 0.70 0.84  CALCIUM  --  8.9   Liver Function Tests: No results for input(s): AST, ALT, ALKPHOS, BILITOT, PROT, ALBUMIN in the last 168 hours. No results for input(s): LIPASE, AMYLASE in the last 168 hours. No results for input(s): AMMONIA in the last 168 hours. CBC:  Recent Labs Lab 08/11/15 0009 08/11/15 0022 08/11/15 0835  WBC 8.8  --  11.5*  NEUTROABS 7.4  --   --   HGB 15.4 16.0 14.5  HCT 45.6 47.0 42.9  MCV 91.0  --  91.1  PLT 228  --  228   Cardiac Enzymes: No results for input(s): CKTOTAL, CKMB, CKMBINDEX, TROPONINI in the last 168 hours. BNP (last 3 results) No results for input(s): PROBNP in the last 8760 hours. CBG: No results for input(s): GLUCAP in the last 168 hours.  No results found for this or any previous visit (from the past 240 hour(s)).         Studies: Mr Lumbar Spine Wo Contrast  08/11/2015  CLINICAL DATA:  69 year old male with acute lumbar back pain radiating down the left lower extremity. Initial encounter. EXAM: MRI LUMBAR SPINE WITHOUT CONTRAST TECHNIQUE: Multiplanar, multisequence MR imaging of the lumbar spine was performed. No intravenous contrast was administered. COMPARISON:  Lumbar MRI 01/30/2007. FINDINGS: Transitional anatomy. Same numbering system as in 2008. Stable vertebral height and alignment. Mild grade 1 anterolisthesis at L5-S1. No marrow edema or evidence of acute osseous abnormality. Visualized lower thoracic spinal cord is normal with conus medularis at L1-L2. Visualized abdominal viscera and paraspinal soft tissues are within normal limits. T11-T12: Stable to decreased mild circumferential disc bulge. Mild facet hypertrophy. T12-L1:  Increased mild disc bulge. L1-L2: Stable small right paracentral disc protrusion. Borderline to mild right lateral recess stenosis is unchanged. L2-L3:  Stable mild  facet hypertrophy. L3-L4: Interval mild disc desiccation and circumferential disc bulge. Mild to moderate facet hypertrophy has progressed. Still, no significant stenosis. L4-L5: Interval disc desiccation. Increased circumferential disc bulge. Increased moderate chronic ligament flavum and moderate to severe facet hypertrophy. Increased epidural lipomatosis. Borderline to mild spinal stenosis. L5-S1: Chronic disc space loss and circumferential disc osteophyte complex. New caudal left lateral recess disc extrusion with sequestered disc fragment measuring 12 mm (series 4, image 10 and series 7, image 35. Moderate facet and ligament flavum hypertrophy at this level is mildly increased. Epidural lipomatosis has increased. Combined there is new severe spinal and left lateral recess stenosis (descending left S1 nerve root level). No left L5 foraminal involvement. Mild right L5 foraminal stenosis is stable. S1-S2: Vestigial disc space. Epidural lipomatosis. Otherwise negative. IMPRESSION: 1. L5-S1 caudal disc extrusion with sequestered disc fragment in the left lateral recess, new since 2008. Severe left lateral recess stenosis and multifactorial severe spinal stenosis at this level. 2. Transitional anatomy. Correlation with radiographs is recommended prior to any operative intervention. 3. Mildly progressed lumbar spine  degeneration elsewhere since 2008. Borderline to mild spinal stenosis now at L4-L5. Electronically Signed   By: Genevie Ann M.D.   On: 08/11/2015 07:17        Scheduled Meds: . [START ON 08/13/2015] aspirin  162 mg Oral q morning - 10a  . enoxaparin (LOVENOX) injection  40 mg Subcutaneous Q24H  . predniSONE  40 mg Oral Q breakfast   Continuous Infusions:   Principal Problem:   Low back pain    Time spent: 40 minutes.    Vernell Leep, MD, FACP, FHM. [facility] Pager [phone]  If 7PM-7AM, please contact night-coverage [email or web address] Password [facility] 08/12/2015, 4:59 PM

## 2015-08-12 NOTE — Evaluation (Signed)
Physical Therapy Evaluation Patient Details Name: Joe Davis MRN: [id number] DOB: 11-Feb-1946 Today's Date: 08/12/2015   History of Present Illness  69 yo male admitted with low back pain, MRI L5-S1 disc extrusion with disc fragment, severe spinal stenosis. Hx of chronic back pain, PE, obesity, bradycardia, syncope/collapse, gout, MRI 04/2015 L4-L5 disc protrusion  Clinical Impression  On eval pt was supervision-Ind level for mobility-walked ~200 feet. Pt rated pain 1/10 at rest, 2/10 with activity. Educated pt on back precautions for comfort, safety. Encouraged pt to follow up with orthopedic/neurosurgery MD as able. Pt reports he has a walker available at home to use if needed.     Follow Up Recommendations No PT follow up. (encouraged follow up with orthopedic/neurosurgery MD as able. )    Equipment Recommendations  None recommended by PT    Recommendations for Other Services       Precautions / Restrictions Precautions Precaution Comments: educated on back precautions to limit stress on back Restrictions Weight Bearing Restrictions: No      Mobility  Bed Mobility Overal bed mobility: Independent                Transfers Overall transfer level: Independent                  Ambulation/Gait Ambulation/Gait assistance: Supervision Ambulation Distance (Feet): 200 Feet Assistive device: None Gait Pattern/deviations: Step-through pattern;Decreased stride length     General Gait Details: supervision for safety since pt states pain comes on quickly. Began with RW but pt did not require it during session  Stairs            Wheelchair Mobility    Modified Rankin (Stroke Patients Only)       Balance Overall balance assessment: No apparent balance deficits (not formally assessed)                                           Pertinent Vitals/Pain Pain Assessment: 0-10 Pain Score: 2  Pain Location: 1 at rest; 2 with activity Pain  Descriptors / Indicators: Sore;Radiating Pain Intervention(s): Monitored during session    Home Living Family/patient expects to be discharged to:: Private residence Living Arrangements: Alone   Type of Home: House Home Access: Stairs to enter Entrance Stairs-Rails: None Entrance Stairs-Number of Steps: a few Home Layout: Two level;Able to live on main level with bedroom/bathroom Home Equipment: Gilford Rile - 2 wheels      Prior Function Level of Independence: Independent               Hand Dominance        Extremity/Trunk Assessment   Upper Extremity Assessment: Overall WFL for tasks assessed           Lower Extremity Assessment: Overall WFL for tasks assessed      Cervical / Trunk Assessment: Normal  Communication   Communication: No difficulties  Cognition Arousal/Alertness: Awake/alert Behavior During Therapy: WFL for tasks assessed/performed Overall Cognitive Status: Within Functional Limits for tasks assessed                      General Comments      Exercises        Assessment/Plan    PT Assessment Patent does not need any further PT services  PT Diagnosis Acute pain   PT Problem List    PT Treatment Interventions  PT Goals (Current goals can be found in the Care Plan section) Acute Rehab PT Goals Patient Stated Goal: improve pain PT Goal Formulation: All assessment and education complete, DC therapy    Frequency     Barriers to discharge        Co-evaluation               End of Session   Activity Tolerance: Patient tolerated treatment well Patient left: with call bell/phone within reach (walking to bathroom)      Functional Assessment Tool Used: clinical judgement Functional Limitation: Mobility: Walking and moving around Mobility: Walking and Moving Around Current Status [id number]): At least 1 percent but less than 20 percent impaired, limited or restricted Mobility: Walking and Moving Around Goal Status  [id number]): At least 1 percent but less than 20 percent impaired, limited or restricted Mobility: Walking and Moving Around Discharge Status [id number]): At least 1 percent but less than 20 percent impaired, limited or restricted    Time: [id number] PT Time Calculation (min) (ACUTE ONLY): 17 min   Charges:   PT Evaluation $Initial PT Evaluation Tier I: 1 Procedure     PT G Codes:   PT G-Codes **NOT FOR INPATIENT CLASS** Functional Assessment Tool Used: clinical judgement Functional Limitation: Mobility: Walking and moving around Mobility: Walking and Moving Around Current Status [id number]): At least 1 percent but less than 20 percent impaired, limited or restricted Mobility: Walking and Moving Around Goal Status [phone]): At least 1 percent but less than 20 percent impaired, limited or restricted Mobility: Walking and Moving Around Discharge Status [phone]): At least 1 percent but less than 20 percent impaired, limited or restricted    Weston Anna, MPT Pager: [phone]

## 2015-08-12 NOTE — Progress Notes (Signed)
RN notified Radiology to have Pt's imaging on a CD per MD for discharge.

## 2015-08-13 DIAGNOSIS — M545 Low back pain: Secondary | ICD-10-CM | POA: Diagnosis not present

## 2015-08-13 DIAGNOSIS — M5442 Lumbago with sciatica, left side: Secondary | ICD-10-CM | POA: Diagnosis not present

## 2015-08-13 MED ORDER — PREDNISONE 20 MG PO TABS: 40.0000 mg | ORAL_TABLET | Freq: Every day | ORAL | Status: DC

## 2015-08-13 NOTE — Progress Notes (Signed)
CSW received consult for (other) with no specification of need for consult.  CSW reviewed chart and PT recommending No PT follow up. No other social work needs identified.   Inappropriate CSW referral.   CSW signing off.   Please re-consult if social work needs arise.   Alison Murray, MSW, [place] [facility] [phone]

## 2015-08-13 NOTE — Discharge Summary (Signed)
Physician Discharge Summary  Joe Davis [id number] DOB: [birth year] DOA: 08/10/2015  PCP: [name], MD  Admit date: 08/10/2015 Discharge date: 08/13/2015  Time spent: Less than 30 minutes  Recommendations for Outpatient Follow-up:  1. Dr. Carolann Littler, PCP in 1 week 2. Dr. Maryellen Pile, Neurosurgery: Patient has already contacted their office and they are coordinating an early follow-up appointment. 3. Dr. Romelle Starcher, Radiology   Discharge Diagnoses:  Principal Problem:   Low back pain   Discharge Condition: Improved & Stable  Diet recommendation: Heart healthy diet.  Filed Weights   08/11/15 0600  Weight: 107.457 kg (236 lb 14.4 oz)    History of present illness:  69 year old male with chronic lumbar back pain who receives epidural injections with Dr Romelle Starcher at [facility] (last injection with 3 months ago) and also sees neurosurgeon Dr. Maryellen Pile at [facility] presented with acute/subacute onset of severe left lower back pain radiating to his left leg that started 2 days prior to admission. He denies trauma, fall or unusual heavy lifting. He did however have to do a lot of house cleaning because his dog had vomited at home. He was seen in ED, discharged home on steroids and pain medications. His pain had significantly subsided after a cocktail he received in ED. He moved a trash can that afternoon. However later that night, he started having worsening pain and returned to the hospital for admission. MRI results are as below.  Hospital Course:   Acute on chronic low back pain - MRI lumbar spine results are as below. - Dr. Clementeen Graham faxed the MRI results to Dr. Raeanne Gathers office (patient's primary neurosurgeon) on 12/27 and Dr. Clementeen Graham was notified that Dr. Marcos Eke had reviewed the results with his PA and did not appear to have new findings on MRI. - No sphincter disturbances. Patient had left lower back pain on admission that radiated to left lower extremity  which has significantly improved and now only involves left lateral leg-rates it at 3/10, intermittent and controlled only on Tylenol overnight. He denies left leg weakness. Symptoms may be radicular. - DD: Spinal stenosis, degenerative disc disease - I have personally attempted to contact Dr. Marcos Eke multiple times on 12/28 and discussed and left a message with his staff to contact me on my cell phone number. Unfortunately I have not heard back from patient's neurosurgeon. - PT evaluated and recommended no PT follow-up. - Patient has been in touch with Dr. Raeanne Gathers office and they are coordinating for an early follow-up appointment post discharge. Patient will be discharged home on short course of prednisone. - Patient has also been provided with a CD with imaging studies that he can take to his MDs at [facility] - Patient has been constantly updated regarding his care including attempts to contact his neurosurgeon.  Elevated lactate - Unclear etiology but has resolved.  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 Now only has mild intermittent leg pain in the left lateral aspect of his leg rated at 3/10 in severity and states that he only used Tylenol overnight for pain. Anxious to discharge home and wishes to pursue further care with his neurosurgeon at [facility].  Filed Vitals:   08/12/15 0610 08/12/15 1356 08/12/15 2050 08/13/15 0452  BP: 146/72 159/80 143/69 147/74  Pulse: 58 68 70 57  Temp: 98.2 F (36.8 C) 98.6 F (37 C)  98.4 F (36.9 C) 97.3 F (36.3 C)  TempSrc: Oral Oral Oral Oral  Resp: 18 19 20 18   Height:      Weight:      SpO2: 97% 97% 94% 96%    General exam: Pleasant elderly male sitting up comfortably in chair this morning. Respiratory system: Clear. No increased work of breathing. Cardiovascular system: S1 & S2 heard, RRR. No JVD,  murmurs, gallops, clicks or pedal edema. Gastrointestinal system: Abdomen is nondistended, soft and nontender. Normal bowel sounds heard. Central nervous system: Alert and oriented. No focal neurological deficits. Extremities: Symmetric 5 x 5 power.  Discharge Instructions      Discharge Instructions    Call MD for:  severe uncontrolled pain    Complete by:  As directed      Diet - low sodium heart healthy    Complete by:  As directed      Increase activity slowly    Complete by:  As directed             Medication List    STOP taking these medications        HYDROcodone-homatropine 5-1.5 MG/5ML syrup  Commonly known as:  HYCODAN      TAKE these medications        aspirin 81 MG tablet  Take 162 mg by mouth every morning.     CHOLESTOFF COMPLETE PO  Take 1 tablet by mouth 2 (two) times daily.     fish oil-omega-3 fatty acids 1000 MG capsule  Take 1 capsule by mouth daily as needed (when going out to eat).     ibuprofen 200 MG tablet  Commonly known as:  ADVIL,MOTRIN  Take 400 mg by mouth every 6 (six) hours as needed for moderate pain.     oxyCODONE-acetaminophen 5-325 MG tablet  Commonly known as:  PERCOCET  Take 1 tablet by mouth every 6 (six) hours as needed for moderate pain.     predniSONE 20 MG tablet  Commonly known as:  DELTASONE  Take 2 tablets (40 mg total) by mouth daily.       Follow-up Information    Follow up with [name], MD. Schedule an appointment as soon as possible for a visit in 1 week.   Specialty:  Family Medicine   Contact information:   [place] [address] [phone]       Schedule an appointment as soon as possible for a visit with Maryellen Pile, MD.   Specialty:  Neurosurgery   Why:  Call after your discharge for a early follow-up appointment to be seen in the next few days.   Contact information:   [place] [address] [phone]       Schedule  an appointment as soon as possible for a visit with Romelle Starcher.   Specialty:  Radiology   Contact information:   [place] [address] [phone]        The results of significant diagnostics from this hospitalization (including imaging, microbiology, ancillary and laboratory) are listed below for reference.    Significant Diagnostic Studies: Mr Lumbar Spine Wo Contrast  08/11/2015  CLINICAL DATA:  69 year old male with acute lumbar back pain radiating down the left lower extremity. Initial encounter. EXAM: MRI LUMBAR SPINE WITHOUT CONTRAST TECHNIQUE: Multiplanar, multisequence MR imaging of the lumbar spine was performed. No intravenous contrast was administered. COMPARISON:  Lumbar MRI 01/30/2007. FINDINGS: Transitional anatomy. Same numbering system as in 2008.  Stable vertebral height and alignment. Mild grade 1 anterolisthesis at L5-S1. No marrow edema or evidence of acute osseous abnormality. Visualized lower thoracic spinal cord is normal with conus medularis at L1-L2. Visualized abdominal viscera and paraspinal soft tissues are within normal limits. T11-T12: Stable to decreased mild circumferential disc bulge. Mild facet hypertrophy. T12-L1:  Increased mild disc bulge. L1-L2: Stable small right paracentral disc protrusion. Borderline to mild right lateral recess stenosis is unchanged. L2-L3:  Stable mild facet hypertrophy. L3-L4: Interval mild disc desiccation and circumferential disc bulge. Mild to moderate facet hypertrophy has progressed. Still, no significant stenosis. L4-L5: Interval disc desiccation. Increased circumferential disc bulge. Increased moderate chronic ligament flavum and moderate to severe facet hypertrophy. Increased epidural lipomatosis. Borderline to mild spinal stenosis. L5-S1: Chronic disc space loss and circumferential disc osteophyte complex. New caudal left lateral recess disc extrusion with sequestered disc fragment measuring 12 mm (series 4, image 10  and series 7, image 35. Moderate facet and ligament flavum hypertrophy at this level is mildly increased. Epidural lipomatosis has increased. Combined there is new severe spinal and left lateral recess stenosis (descending left S1 nerve root level). No left L5 foraminal involvement. Mild right L5 foraminal stenosis is stable. S1-S2: Vestigial disc space. Epidural lipomatosis. Otherwise negative. IMPRESSION: 1. L5-S1 caudal disc extrusion with sequestered disc fragment in the left lateral recess, new since 2008. Severe left lateral recess stenosis and multifactorial severe spinal stenosis at this level. 2. Transitional anatomy. Correlation with radiographs is recommended prior to any operative intervention. 3. Mildly progressed lumbar spine degeneration elsewhere since 2008. Borderline to mild spinal stenosis now at L4-L5. Electronically Signed   By: Genevie Ann M.D.   On: 08/11/2015 07:17    Microbiology: No results found for this or any previous visit (from the past 240 hour(s)).   Labs: Basic Metabolic Panel:  Recent Labs Lab 08/11/15 0022 08/11/15 0835  NA 140 139  K 4.4 4.5  CL 106 105  CO2  --  26  GLUCOSE 161* 142*  BUN 26* 25*  CREATININE 0.70 0.84  CALCIUM  --  8.9   Liver Function Tests: No results for input(s): AST, ALT, ALKPHOS, BILITOT, PROT, ALBUMIN in the last 168 hours. No results for input(s): LIPASE, AMYLASE in the last 168 hours. No results for input(s): AMMONIA in the last 168 hours. CBC:  Recent Labs Lab 08/11/15 0009 08/11/15 0022 08/11/15 0835  WBC 8.8  --  11.5*  NEUTROABS 7.4  --   --   HGB 15.4 16.0 14.5  HCT 45.6 47.0 42.9  MCV 91.0  --  91.1  PLT 228  --  228   Cardiac Enzymes: No results for input(s): CKTOTAL, CKMB, CKMBINDEX, TROPONINI in the last 168 hours. BNP: BNP (last 3 results) No results for input(s): BNP in the last 8760 hours.  ProBNP (last 3 results) No results for input(s): PROBNP in the last 8760 hours.  CBG: No results for  input(s): GLUCAP in the last 168 hours.    Signed:  Vernell Leep, MD, FACP, FHM. [facility] Pager [phone]  If 7PM-7AM, please contact night-coverage [email or web address] Password [email or web address] 08/13/2015, 2:21 PM

## 2015-08-13 NOTE — Progress Notes (Signed)
Patient discharged.  Educated on discharge instructions, medications, and follow-up appointment.  Stated understanding and AVS signed.  IV removed- clean dry and intact.  Wife and patient gathered belongings.  Patient escorted to ride.  Medications sent electronically, educated patient on where to pick up.

## 2015-08-13 NOTE — Discharge Instructions (Signed)
Back Pain, Adult °Back pain is very common in adults. The cause of back pain is rarely dangerous and the pain often gets better over time. The cause of your back pain may not be known. Some common causes of back pain include: °· Strain of the muscles or ligaments supporting the spine. °· Wear and tear (degeneration) of the spinal disks. °· Arthritis. °· Direct injury to the back. °For many people, back pain may return. Since back pain is rarely dangerous, most people can learn to manage this condition on their own. °HOME CARE INSTRUCTIONS °Watch your back pain for any changes. The following actions may help to lessen any discomfort you are feeling: °· Remain active. It is stressful on your back to sit or stand in one place for long periods of time. Do not sit, drive, or stand in one place for more than 30 minutes at a time. Take short walks on even surfaces as soon as you are able. Try to increase the length of time you walk each day. °· Exercise regularly as directed by your health care provider. Exercise helps your back heal faster. It also helps avoid future injury by keeping your muscles strong and flexible. °· Do not stay in bed. Resting more than 1-2 days can delay your recovery. °· Pay attention to your body when you bend and lift. The most comfortable positions are those that put less stress on your recovering back. Always use proper lifting techniques, including: °¨ Bending your knees. °¨ Keeping the load close to your body. °¨ Avoiding twisting. °· Find a comfortable position to sleep. Use a firm mattress and lie on your side with your knees slightly bent. If you lie on your back, put a pillow under your knees. °· Avoid feeling anxious or stressed. Stress increases muscle tension and can worsen back pain. It is important to recognize when you are anxious or stressed and learn ways to manage it, such as with exercise. °· Take medicines only as directed by your health care provider. Over-the-counter  medicines to reduce pain and inflammation are often the most helpful. Your health care provider may prescribe muscle relaxant drugs. These medicines help dull your pain so you can more quickly return to your normal activities and healthy exercise. °· Apply ice to the injured area: °¨ Put ice in a plastic bag. °¨ Place a towel between your skin and the bag. °¨ Leave the ice on for 20 minutes, 2-3 times a day for the first 2-3 days. After that, ice and heat may be alternated to reduce pain and spasms. °· Maintain a healthy weight. Excess weight puts extra stress on your back and makes it difficult to maintain good posture. °SEEK MEDICAL CARE IF: °· You have pain that is not relieved with rest or medicine. °· You have increasing pain going down into the legs or buttocks. °· You have pain that does not improve in one week. °· You have night pain. °· You lose weight. °· You have a fever or chills. °SEEK IMMEDIATE MEDICAL CARE IF:  °· You develop new bowel or bladder control problems. °· You have unusual weakness or numbness in your arms or legs. °· You develop nausea or vomiting. °· You develop abdominal pain. °· You feel faint. °  °This information is not intended to replace advice given to you by your health care provider. Make sure you discuss any questions you have with your health care provider. °  °Document Released: 08/01/2005 Document Revised: 08/22/2014 Document Reviewed: 12/03/2013 °Elsevier Interactive Patient Education ©2016 Elsevier   Inc.  Pain Medicine Instructions  HOW CAN PAIN MEDICINE AFFECT ME?    You were given a prescription for pain medicine. This medicine may make you tired or drowsy and may affect your ability to think clearly. Pain medicine may also affect your ability to drive or perform certain physical activities. It may not be possible to make all of your pain go away, but you should be comfortable enough to move, breathe, and take care of yourself.  HOW OFTEN SHOULD I TAKE PAIN MEDICINE  AND HOW MUCH SHOULD I TAKE?  Take pain medicine only as directed by your health care provider and only as needed for pain.  You do not need to take pain medicine if you are not having pain, unless directed by your health care provider.  You can take less than the prescribed dose if you find that a smaller amount of medicine controls your pain. WHAT RESTRICTIONS DO I HAVE WHILE TAKING PAIN MEDICINE?  Follow these instructions after you start taking pain medicine, while you are taking the medicine, and for 8 hours after you stop taking the medicine:  Do not drive.  Do not operate machinery.  Do not operate power tools.  Do not sign legal documents.  Do not drink alcohol.  Do not take sleeping pills.  Do not supervise children by yourself.  Do not participate in activities that require climbing or being in high places.  Do not enter a body of water--such as a lake, river, ocean, spa, or swimming pool--without an adult nearby who can monitor and help you. HOW CAN I KEEP OTHERS SAFE WHILE I AM TAKING PAIN MEDICINE?  Store your pain medicine as directed by your health care provider. Make sure that it is placed where children and pets cannot reach it.  Never share your pain medicine with anyone.  Do not save any leftover pills. If you have any leftover pain medicine, get rid of it or destroy it as directed by your health care provider. WHAT ELSE DO I NEED TO KNOW ABOUT TAKING PAIN MEDICINE?  Use a stool softener if you become constipated from your pain medicine. Increasing your intake of fruits and vegetables will also help with constipation.  Write down the times when you take your pain medicine. Look at the times before you take your next dose of medicine. It is easy to become confused while on pain medicine. Recording the times helps you to avoid an overdose.  If your pain is severe, do not try to treat it yourself by taking more pills than instructed on your prescription. Contact your health care  provider for help.  You may have been prescribed a pain medicine that contains acetaminophen. Do not take any other acetaminophen while taking this medicine. An overdose of acetaminophen can result in severe liver damage. Acetaminophen is found in many over-the-counter (OTC) and prescription medicines. If you are taking any medicines in addition to your pain medicine, check the active ingredients on those medicines to see if acetaminophen is listed. WHEN SHOULD I CALL MY HEALTH CARE PROVIDER?  Your medicine is not helping to make the pain go away.  You vomit or have diarrhea shortly after taking the medicine.  You develop new pain in areas that did not hurt before.  You have an allergic reaction to your medicine. This may include:  Itchiness.  Swelling.  Dizziness.  Developing a new rash. WHEN SHOULD I CALL 911 OR GO TO THE EMERGENCY ROOM?  You feel dizzy or  you faint.  You are very confused or disoriented.  You repeatedly vomit.  Your skin or lips turn pale or bluish in color.  You have shortness of breath or you are breathing much more slowly than usual.  You have a severe allergic reaction to your medicine. This includes:  Developing tongue swelling.  Having difficulty breathing. This information is not intended to replace advice given to you by your health care provider. Make sure you discuss any questions you have with your health care provider.  Document Released: 11/07/2000 Document Revised: 12/16/2014 Document Reviewed: 06/05/2014  Elsevier Interactive Patient Education [employer].

## 2015-08-14 ENCOUNTER — Telehealth: Payer: Self-pay

## 2015-08-14 NOTE — Telephone Encounter (Signed)
First TCM attempt

## 2015-08-18 DIAGNOSIS — M5127 Other intervertebral disc displacement, lumbosacral region: Secondary | ICD-10-CM | POA: Diagnosis not present

## 2015-08-18 DIAGNOSIS — M9943 Connective tissue stenosis of neural canal of lumbar region: Secondary | ICD-10-CM | POA: Diagnosis not present

## 2015-08-18 DIAGNOSIS — M5137 Other intervertebral disc degeneration, lumbosacral region: Secondary | ICD-10-CM | POA: Diagnosis not present

## 2015-08-18 DIAGNOSIS — M4806 Spinal stenosis, lumbar region: Secondary | ICD-10-CM | POA: Diagnosis not present

## 2015-08-18 DIAGNOSIS — M549 Dorsalgia, unspecified: Secondary | ICD-10-CM | POA: Diagnosis not present

## 2015-08-21 DIAGNOSIS — N5201 Erectile dysfunction due to arterial insufficiency: Secondary | ICD-10-CM | POA: Diagnosis not present

## 2015-08-21 DIAGNOSIS — N401 Enlarged prostate with lower urinary tract symptoms: Secondary | ICD-10-CM | POA: Diagnosis not present

## 2015-08-21 DIAGNOSIS — Z0000 Encounter for general adult medical examination without abnormal findings: Secondary | ICD-10-CM | POA: Diagnosis not present

## 2015-08-21 DIAGNOSIS — Z125 Encounter for screening for malignant neoplasm of prostate: Secondary | ICD-10-CM | POA: Diagnosis not present

## 2015-08-21 DIAGNOSIS — N138 Other obstructive and reflux uropathy: Secondary | ICD-10-CM | POA: Diagnosis not present

## 2015-10-02 ENCOUNTER — Encounter: Payer: Self-pay | Admitting: Cardiology

## 2015-10-02 ENCOUNTER — Ambulatory Visit (INDEPENDENT_AMBULATORY_CARE_PROVIDER_SITE_OTHER): Payer: Medicare Other | Admitting: Cardiology

## 2015-10-02 VITALS — BP 146/80 | HR 60 | Ht 70.5 in | Wt 224.0 lb

## 2015-10-02 DIAGNOSIS — M1610 Unilateral primary osteoarthritis, unspecified hip: Secondary | ICD-10-CM

## 2015-10-02 DIAGNOSIS — I519 Heart disease, unspecified: Secondary | ICD-10-CM

## 2015-10-02 DIAGNOSIS — E7800 Pure hypercholesterolemia, unspecified: Secondary | ICD-10-CM | POA: Diagnosis not present

## 2015-10-02 DIAGNOSIS — I5189 Other ill-defined heart diseases: Secondary | ICD-10-CM

## 2015-10-02 NOTE — Patient Instructions (Signed)
Medication Instructions:  Your physician recommends that you continue on your current medications as directed. Please refer to the Current Medication list given to you today.  Labwork: RETURN SOON FOR LP/BMET/HFP  Testing/Procedures: NONE  Follow-Up: Your physician recommends that you schedule a follow-up appointment in: [place]   If you need a refill on your cardiac medications before your next appointment, please call your pharmacy.

## 2015-10-02 NOTE — Progress Notes (Signed)
Cardiology Office Note   Date:  10/02/2015   ID:  Joe Davis, DOB [birth year], MRN [id number]  PCP:  [name], MD  Cardiologist: Darlin Coco MD  Chief Complaint  Patient presents with  . scheduled office visit      History of Present Illness: Joe Davis is a 70 y.o. male who presents for scheduled follow-up visit.  This 70 year old gentleman has a history of diastolic left ventricular dysfunction. He has a remote history of DVT with pulmonary embolus. He has a history of hypercholesterolemia and atypical chest pain. His last echocardiogram was 03/14/12 which showed normal left ventricular systolic function with ejection fraction 55-60% we repeated his echocardiogram on 09/26/14 and his ejection fraction was 55-60% with no wall motion abnormalities and there was grade 1 diastolic dysfunction. The patient underwent successful right total hip replacement at [facility] on 10/05/14 using the anterior approach.  He did well initially postoperatively.  Several days after coming home from the hospital he took a hot shower and then had vasovagal syncope and was admitted overnight to [facility] on 10/10/14.  He ruled out for a myocardial infarction.  On 08/10/15 he presented to [place] [facility] long hospital with severe low back pain and was admitted.  He was subsequently referred back to his neurosurgeon at [facility].  So for he has not had to have surgery. The patient has started to increase his activity.  He has been walking more and is started back swimming.  He denies any chest pain. He is concerned about his cholesterol.  He has not been taking any statin therapy at this point although in the past he has been prescribed Lipitor.  He is not fasting today.  We will have him return next week for fasting lab work.  Past Medical History  Diagnosis Date  . Hypercholesterolemia     ? statin intolerant. May tolerate Lipitor  . DVT (deep venous thrombosis) ([place]) 1979    RLE  . Basal  cell carcinoma   . Syncope and collapse 8-10 yrs ago  . Bradycardia vasovagal response 10 yrs ago    asymptomatic  . BPH (benign prostatic hyperplasia)   . Normal nuclear stress test May 2011  . Erectile dysfunction   . Diastolic dysfunction     Per echo in 2008  . Obesity   . Pulmonary embolism ([place]) 1979  . Arthritis     "knees, hips" (10/10/2014)  . History of gout     Past Surgical History  Procedure Laterality Date  . Cardiovascular stress test  May 2011    EF 73%; No ischemia  . Mohs surgery  6 yrs     nose  . Colonoscopy  2013  . Knee arthroscopy Right 10/03/2012    Procedure: RIGHT KNEE ARTHROSCOPY WITH DEBRIDEMENT;  Surgeon: [name], MD;  Location: [facility];  Service: Orthopedics;  Laterality: Right;  WITH DEBRIDEMENT  . 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 Joint replacement       Current Outpatient Prescriptions  Medication Sig Dispense Refill  . aspirin 81 MG tablet Take 162 mg by mouth every morning.     . fish oil-omega-3 fatty acids 1000 MG capsule Take 1 capsule by mouth daily as needed (when going out to eat).     Marland Kitchen Plant Sterol Stanol-Pantethine (CHOLESTOFF COMPLETE PO) Take 1 tablet by mouth 2 (two) times daily.      No current facility-administered medications for this visit.    Allergies:  Crestor    Social History:  The patient  reports that he quit smoking about 38 years ago. His smoking use included Cigarettes. He has a 10 pack-year smoking history. He has never used smokeless tobacco. He reports that he drinks about 4.2 oz of alcohol per week. He reports that he does not use illicit drugs.   Family History:  The patient's He was adopted. Family history is unknown by patient.    ROS:  Please see the history of present illness.   Otherwise, review of systems are positive for none.   All other systems are reviewed and negative.    PHYSICAL EXAM: VS:  BP 146/80 mmHg  Pulse 60  Ht 5' 10.5" (1.791 m)  Wt 224 lb (101.606 kg)  BMI 31.68  kg/m2 , BMI Body mass index is 31.68 kg/(m^2). GEN: Well nourished, well developed, in no acute distress HEENT: normal Neck: no JVD, carotid bruits, or masses Cardiac: RRR; no murmurs, rubs, or gallops,no edema  Respiratory:  clear to auscultation bilaterally, normal work of breathing GI: soft, nontender, nondistended, + BS MS: no deformity or atrophy Skin: warm and dry, no rash Neuro:  Strength and sensation are intact Psych: euthymic mood, full affect   EKG:  EKG is ordered today. The ekg ordered today demonstrates normal sinus rhythm.  Left axis deviation.  Abnormal EKG.  Since previous tracing of 10/11/14, no significant change.   Recent Labs: 10/10/2014: ALT 18 08/11/2015: BUN 25*; Creatinine, Ser 0.84; Hemoglobin 14.5; Platelets 228; Potassium 4.5; Sodium 139    Lipid Panel    Component Value Date/Time   CHOL 187 09/26/2014 0934   TRIG 107.0 09/26/2014 0934   HDL 43.80 09/26/2014 0934   CHOLHDL 4 09/26/2014 0934   VLDL 21.4 09/26/2014 0934   LDLCALC 122* 09/26/2014 0934   LDLDIRECT 125.7 09/25/2012 0914      Wt Readings from Last 3 Encounters:  10/02/15 224 lb (101.606 kg)  08/11/15 236 lb 14.4 oz (107.457 kg)  08/05/15 226 lb (102.513 kg)        ASSESSMENT AND PLAN:  1. History of diastolic dysfunction 2. History of postoperative vasovagal reaction with syncope in February 2016 3. History of cholesterolemia. Presently not taking statin therapy. 4. Osteoarthritis of right hip, having undergone successful surgery at [facility]   Current medicines are reviewed at length with the patient today.  The patient does not have concerns regarding medicines.  The following changes have been made:  no change  Labs/ tests ordered today include:   Orders Placed This Encounter  Procedures  . Lipid panel  . Hepatic function panel  . Basic metabolic panel  . EKG 12-Lead     Disposition:  The patient is to continue regular exercise including swimming.  Continue  efforts at weight loss.  He will return next week for fasting lipid panel hepatic function panel and basal metabolic panel.  He will return in 4 months for follow-up office visit with [name] after my retirement  Signed, Darlin Coco MD 10/02/2015 5:41 PM    [address], [place], [place]  [address] Phone: [phone]; Fax: [phone]

## 2015-10-08 ENCOUNTER — Other Ambulatory Visit (INDEPENDENT_AMBULATORY_CARE_PROVIDER_SITE_OTHER): Payer: Medicare Other | Admitting: *Deleted

## 2015-10-08 DIAGNOSIS — I519 Heart disease, unspecified: Secondary | ICD-10-CM | POA: Diagnosis not present

## 2015-10-08 DIAGNOSIS — E7800 Pure hypercholesterolemia, unspecified: Secondary | ICD-10-CM | POA: Diagnosis not present

## 2015-10-08 DIAGNOSIS — I5189 Other ill-defined heart diseases: Secondary | ICD-10-CM

## 2015-10-08 LAB — BASIC METABOLIC PANEL
BUN: 18 mg/dL (ref 7–25)
CHLORIDE: 106 mmol/L (ref 98–110)
CO2: 22 mmol/L (ref 20–31)
Calcium: 8.8 mg/dL (ref 8.6–10.3)
Creat: 0.92 mg/dL (ref 0.70–1.18)
Glucose, Bld: 97 mg/dL (ref 65–99)
POTASSIUM: 4.2 mmol/L (ref 3.5–5.3)
Sodium: 139 mmol/L (ref 135–146)

## 2015-10-08 LAB — LIPID PANEL
CHOL/HDL RATIO: 4.6 ratio (ref <=5.0)
CHOLESTEROL: 180 mg/dL (ref 125–200)
HDL: 39 mg/dL — AB (ref >=40)
LDL CALC: 121 mg/dL (ref <130)
TRIGLYCERIDES: 99 mg/dL (ref <150)
VLDL: 20 mg/dL (ref <30)

## 2015-10-08 LAB — HEPATIC FUNCTION PANEL
ALBUMIN: 3.7 g/dL (ref 3.6–5.1)
ALT: 18 U/L (ref 9–46)
AST: 26 U/L (ref 10–35)
Alkaline Phosphatase: 47 U/L (ref 40–115)
Bilirubin, Direct: 0.2 mg/dL (ref <=0.2)
Indirect Bilirubin: 0.5 mg/dL (ref 0.2–1.2)
TOTAL PROTEIN: 6.3 g/dL (ref 6.1–8.1)
Total Bilirubin: 0.7 mg/dL (ref 0.2–1.2)

## 2015-10-08 NOTE — Progress Notes (Signed)
Quick Note:  Please report to patient. The recent labs are stable. Continue same medication and careful diet. The LDL is still above target. I would like him to try a low dose Lipitor 10 mg daily. He should have follow-up lab work in 2 months ______

## 2015-10-08 NOTE — Addendum Note (Signed)
Addended by: Eulis Foster on: 10/08/2015 09:54 AM   Modules accepted: Orders

## 2015-10-15 ENCOUNTER — Telehealth: Payer: Self-pay | Admitting: Cardiology

## 2015-10-15 DIAGNOSIS — E7800 Pure hypercholesterolemia, unspecified: Secondary | ICD-10-CM

## 2015-10-15 MED ORDER — ATORVASTATIN CALCIUM 10 MG PO TABS: 10.0000 mg | ORAL_TABLET | Freq: Every day | ORAL | Status: DC

## 2015-10-15 NOTE — Telephone Encounter (Signed)
-----   Message from Darlin Coco, MD sent at 10/08/2015  6:14 PM EST ----- Please report to patient.  The recent labs are stable. Continue same medication and careful diet.  The LDL is still above target.  I would like him to try a low dose Lipitor 10 mg daily.  He should have follow-up lab work in 2 months

## 2015-10-15 NOTE — Telephone Encounter (Signed)
Patient is having increased shortness of breath and requested follow up appointment to be moved up Scheduled appointment with Brynda Rim PA for 3/13, patient good with this date Advised to call back if worse  Advised patient of lab results

## 2015-10-15 NOTE — Telephone Encounter (Signed)
Returning your call. °

## 2015-10-25 NOTE — Progress Notes (Signed)
Cardiology Office Note:    Date:  10/26/2015   ID:  Joe Davis, DOB 03-04-1946, MRN [id number]  PCP:  Joe Post, MD  Cardiologist:  Dr. Darlin Davis >> Dr. Jenkins Davis   Electrophysiologist:  n/a  Chief Complaint  Patient presents with  . Shortness of Breath    History of Present Illness:     Joe Davis is a 70 y.o. male with a hx of diastolic dysfunction, remote DVT and pulmonary embolism, HL, atypical chest pain. He is status Davis right THR at [facility] in 2/16. He has a history of vasovagal syncope while taking a hot shower several days after his hip surgery. Last seen by Dr. Mare Davis 2/17.  He called in recently with shortness of breath. He was added on for further evaluation.  He thought he was here for a stress test today.  He is here with his long time companion, Joe Davis.  She is a Therapist, sports in anesthesia at [employer].  The patient notes a hx of gradually worsening DOE over the past few years. His companion notices this with certain activities. He denies chest pain. he swims a few times a week.  Denies any significant worsening in DOE.  Denies orthopnea, PND, edema.  Denies syncope. His companion has noticed snoring and apnea. He admits to fatigue and daytime hypersomnolence.  He denies significant cough, wheezing, bleeding, vomiting or diarrhea.     Past Medical History  Diagnosis Date  . Hypercholesterolemia     ? statin intolerant. May tolerate Lipitor  . DVT (deep venous thrombosis) ([place]) 1979    RLE  . Basal cell carcinoma   . Syncope and collapse 8-10 yrs ago  . Bradycardia vasovagal response 10 yrs ago    asymptomatic  . BPH (benign prostatic hyperplasia)   . Normal nuclear stress test May 2011  . Erectile dysfunction   . Diastolic dysfunction     Per echo in 2008  . Obesity   . Pulmonary embolism ([place]) 1979  . Arthritis     "knees, hips" (10/10/2014)  . History of gout     Past Surgical History  Procedure Laterality Date  . Cardiovascular stress test   May 2011    EF 73%; No ischemia  . Mohs surgery  6 yrs     nose  . Colonoscopy  2013  . Knee arthroscopy Right 10/03/2012    Procedure: RIGHT KNEE ARTHROSCOPY WITH DEBRIDEMENT;  Surgeon: [name], MD;  Location: [facility];  Service: Orthopedics;  Laterality: Right;  WITH DEBRIDEMENT  . 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 Joint replacement      Current Medications: Outpatient Prescriptions Prior to Visit  Medication Sig Dispense Refill  . aspirin 81 MG tablet Take 162 mg by mouth every morning.     . fish oil-omega-3 fatty acids 1000 MG capsule Take 1 capsule by mouth daily as needed (when going out to eat).     Marland Kitchen Plant Sterol Stanol-Pantethine (CHOLESTOFF COMPLETE PO) Take 1 tablet by mouth 2 (two) times daily.     Marland Kitchen atorvastatin (LIPITOR) 10 MG tablet Take 1 tablet (10 mg total) by mouth daily. 30 tablet 3   No facility-administered medications prior to visit.     Allergies:   Crestor   Social History   Social History  . Marital Status: Divorced    Spouse Name: N/A  . Number of Children: N/A  . Years of Education: N/A   Social History Main Topics  . Smoking status:  Former Smoker -- 1.00 packs/day for 10 years    Types: Cigarettes    Quit date: 08/15/1977  . Smokeless tobacco: Never Used  . Alcohol Use: 4.2 oz/week    7 Shots of liquor per week  . Drug Use: No  . Sexual Activity: Yes   Other Topics Concern  . None   Social History Narrative     Family History:  The patient's He was adopted. Family history is unknown by patient.   ROS:   Please see the history of present illness.    ROS All other systems reviewed and are negative.   Physical Exam:    VS:  BP 124/88 mmHg  Pulse 53  Ht 5' 10.5" (1.791 m)  Wt 237 lb (107.502 kg)  BMI 33.51 kg/m2  SpO2 96%   GEN: Well nourished, well developed, in no acute distress HEENT: normal Neck: no JVD, no masses Cardiac: Normal S1/S2, RRR; no murmurs, rubs, or gallops, no edema   Respiratory:  clear to  auscultation bilaterally; no wheezing, rhonchi or rales GI: soft, nontender, nondistended MS: no deformity or atrophy Skin: warm and dry Neuro: No focal deficits  Psych: Alert and oriented x 3, normal affect  Wt Readings from Last 3 Encounters:  10/26/15 237 lb (107.502 kg)  10/02/15 224 lb (101.606 kg)  08/11/15 236 lb 14.4 oz (107.457 kg)      Studies/Labs Reviewed:     EKG:  EKG is  ordered today.  The ekg ordered today demonstrates Sinus brady, HR 54, LAD, QTc 396 ms, no change from prior tracing.   Recent Labs: 08/11/2015: Hemoglobin 14.5; Platelets 228 10/08/2015: ALT 18; BUN 18; Creat 0.92; Potassium 4.2; Sodium 139   Recent Lipid Panel    Component Value Date/Time   CHOL 180 10/08/2015 0954   TRIG 99 10/08/2015 0954   HDL 39* 10/08/2015 0954   CHOLHDL 4.6 10/08/2015 0954   VLDL 20 10/08/2015 0954   LDLCALC 121 10/08/2015 0954   LDLDIRECT 125.7 09/25/2012 0914    Additional studies/ records that were reviewed today include:   Echo 09/26/14 EF 55-65%, Gr 1 DD   ASSESSMENT:     1. Shortness of breath   2. Snoring   3. Hypercholesterolemia   4. Other fatigue     PLAN:     In order of problems listed above:  1. Shortness of breath - Etiology not clear. Probably multifactorial and related to obesity, age, deconditioning.  He has a hx of diastolic dysfunction. Volume looks stable on exam.   -  BMET, BNP.  Add low dose Lasix if BNP elevated.  -  Arrange ETT- Myoview to r/o ischemia and recheck EF.  2. Snoring - Witnessed apneic episodes. STOP-BANG score 5-6 (high risk for OSA). Arrange split night sleep study.   3. HL - Continue statin.   4. Fatigue - Suspect he has sleep apnea.  This is likely cause of fatigue.  His symptoms have been gradual.  Hgb in 12/16 was normal.  No signs of bleeding.  Will check TSH. Arrange Split night sleep study as noted.    Medication Adjustments/Labs and Tests Ordered: Current medicines are reviewed at length with the  patient today.  Concerns regarding medicines are outlined above.  Medication changes, Labs and Tests ordered today are outlined in the Patient Instructions noted below. Patient Instructions  Medication Instructions:  Your physician recommends that you continue on your current medications as directed. Please refer to the Current Medication list given to you  today.  Labwork: 1. BMET, TSH, BNP  Testing/Procedures: Your physician has requested that you have en exercise stress myoview. For further information please visit [facility]uge[facility]iesta[facility][email or web address]. Please follow instruction sheet, as given.  SPLIT NIGHT SLEEP STUDY  Follow-Up: 3-4 WEEKS WITH Joe Davis, PAC SAME DAY [name] IS IN THE OFFICE  Any Other Special Instructions Will Be Listed Below (If Applicable).  If you need a refill on your cardiac medications before your next appointment, please call your pharmacy.   Signed, [name], PA-C  10/26/2015 3:26 PM    [facility] [address], [place], [place]  [address] Phone: [phone]; Fax: [phone]

## 2015-10-26 ENCOUNTER — Ambulatory Visit (INDEPENDENT_AMBULATORY_CARE_PROVIDER_SITE_OTHER): Payer: Medicare Other | Admitting: Physician Assistant

## 2015-10-26 ENCOUNTER — Encounter: Payer: Self-pay | Admitting: Physician Assistant

## 2015-10-26 VITALS — BP 124/88 | HR 53 | Ht 70.5 in | Wt 237.0 lb

## 2015-10-26 DIAGNOSIS — I519 Heart disease, unspecified: Secondary | ICD-10-CM | POA: Diagnosis not present

## 2015-10-26 DIAGNOSIS — R0602 Shortness of breath: Secondary | ICD-10-CM | POA: Diagnosis not present

## 2015-10-26 DIAGNOSIS — R5383 Other fatigue: Secondary | ICD-10-CM | POA: Diagnosis not present

## 2015-10-26 DIAGNOSIS — R0683 Snoring: Secondary | ICD-10-CM | POA: Diagnosis not present

## 2015-10-26 DIAGNOSIS — E7800 Pure hypercholesterolemia, unspecified: Secondary | ICD-10-CM

## 2015-10-26 LAB — BASIC METABOLIC PANEL
BUN: 16 mg/dL (ref 7–25)
CHLORIDE: 105 mmol/L (ref 98–110)
CO2: 25 mmol/L (ref 20–31)
CREATININE: 0.73 mg/dL (ref 0.70–1.18)
Calcium: 9.2 mg/dL (ref 8.6–10.3)
Glucose, Bld: 73 mg/dL (ref 65–99)
POTASSIUM: 4.5 mmol/L (ref 3.5–5.3)
Sodium: 140 mmol/L (ref 135–146)

## 2015-10-26 LAB — TSH: TSH: 1.92 m[IU]/L (ref 0.40–4.50)

## 2015-10-26 MED ORDER — ATORVASTATIN CALCIUM 10 MG PO TABS: 10.0000 mg | ORAL_TABLET | Freq: Every day | ORAL | Status: DC

## 2015-10-26 NOTE — Patient Instructions (Addendum)
Medication Instructions:  Your physician recommends that you continue on your current medications as directed. Please refer to the Current Medication list given to you today.  Labwork: 1. BMET, TSH, BNP  Testing/Procedures: Your physician has requested that you have en exercise stress myoview. For further information please visit [facility]uge[facility]iesta[facility][email or web address]. Please follow instruction sheet, as given.  SPLIT NIGHT SLEEP STUDY  Follow-Up: 3-4 WEEKS WITH SCOTT WEAVER, PAC SAME DAY [name] IS IN THE OFFICE  Any Other Special Instructions Will Be Listed Below (If Applicable).  If you need a refill on your cardiac medications before your next appointment, please call your pharmacy.

## 2015-10-27 LAB — BRAIN NATRIURETIC PEPTIDE: BRAIN NATRIURETIC PEPTIDE: 25.7 pg/mL (ref <100)

## 2015-10-29 ENCOUNTER — Telehealth (HOSPITAL_COMMUNITY): Payer: Self-pay | Admitting: *Deleted

## 2015-10-29 ENCOUNTER — Telehealth: Payer: Self-pay | Admitting: *Deleted

## 2015-10-29 NOTE — Telephone Encounter (Signed)
Pt notified of lab results by phone with verbal understanding.  

## 2015-10-29 NOTE — Telephone Encounter (Signed)
Patient given detailed instructions per Myocardial Perfusion Study Information Sheet for the test on 11/04/15 at 9:45. Patient notified to arrive 15 minutes early and that it is imperative to arrive on time for appointment to keep from having the test rescheduled. ° If you need to cancel or reschedule your appointment, please call the office within 24 hours of your appointment. Failure to do so may result in a cancellation of your appointment, and a $50 no show fee. Patient verbalized understanding.*Joe Davis ° ° °

## 2015-11-04 ENCOUNTER — Ambulatory Visit (HOSPITAL_COMMUNITY): Payer: Medicare Other | Attending: Cardiology

## 2015-11-04 DIAGNOSIS — R0609 Other forms of dyspnea: Secondary | ICD-10-CM | POA: Diagnosis not present

## 2015-11-04 DIAGNOSIS — R0602 Shortness of breath: Secondary | ICD-10-CM | POA: Insufficient documentation

## 2015-11-04 LAB — MYOCARDIAL PERFUSION IMAGING
CHL CUP MPHR: 149 {beats}/min
CHL CUP RESTING HR STRESS: 50 {beats}/min
CSEPED: 5 min
CSEPEDS: 31 s
CSEPEW: 7 METS
CSEPPHR: 133 {beats}/min
LVDIAVOL: 148 mL (ref 62–150)
LVSYSVOL: 58 mL
NUC STRESS TID: 1.02
Percent HR: 89 %
RATE: 0.39
RPE: 18
SDS: 0
SRS: 0
SSS: 0

## 2015-11-04 MED ORDER — TECHNETIUM TC 99M SESTAMIBI GENERIC - CARDIOLITE
10.5000 | Freq: Once | INTRAVENOUS | Status: AC | PRN
  Administered 2015-11-04: 11 via INTRAVENOUS

## 2015-11-04 MED ORDER — TECHNETIUM TC 99M SESTAMIBI GENERIC - CARDIOLITE
33.0000 | Freq: Once | INTRAVENOUS | Status: AC | PRN
  Administered 2015-11-04: 33 via INTRAVENOUS

## 2015-11-05 ENCOUNTER — Encounter: Payer: Self-pay | Admitting: Physician Assistant

## 2015-11-05 ENCOUNTER — Telehealth: Payer: Self-pay | Admitting: *Deleted

## 2015-11-05 NOTE — Telephone Encounter (Signed)
Lmptcb x 2 

## 2015-11-05 NOTE — Telephone Encounter (Signed)
Lmtcb to go over myoview results. 

## 2015-11-06 NOTE — Telephone Encounter (Signed)
Pt notified of myoview results. Pt notified BP increased alot during exercise portion of myoview. Advised per PA check his BP several times and bring those readings to his next office visit. Pt agreeable to plan of care.

## 2015-11-17 DIAGNOSIS — M9943 Connective tissue stenosis of neural canal of lumbar region: Secondary | ICD-10-CM | POA: Diagnosis not present

## 2015-11-29 NOTE — Progress Notes (Signed)
Cardiology Office Note:    Date:  11/30/2015   ID:  Joe Davis, DOB 1945/12/10, MRN [id number]  PCP:  [name], MD  Cardiologist:  [name] >> [name]   Electrophysiologist:  n/a  Chief Complaint  Patient presents with  . Shortness of Breath    follow up    History of Present Illness:     JUD FABREGAS is a 70 y.o. male with a hx of diastolic dysfunction, remote DVT and pulmonary embolism, HL, atypical chest pain. He is status post right THR at [facility] in 2/16. He has a history of vasovagal syncope while taking a hot shower several days after his hip surgery. Last seen by Dr. Mare Ferrari 2/17.  I saw him 10/26/15 for complaints of shortness of breath. His long time companion, [name] is a Therapist, sports in anesthesia at [employer].  She noted a gradually worsening of DOE over the past few years.  BNP and TSH was normal. Stress Myoview was low risk and negative for ischemia. He did have a hypertensive blood pressure response with exercise. He complained of snoring and there was witnessed apneic episodes. Split night sleep study was arranged.  Returns for FU.  He is feeling better.  He has been more active and his breathing is improved.  He denies chest pain or syncope.  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     Past Medical History  Diagnosis Date  . Hypercholesterolemia     ? statin intolerant. May tolerate Lipitor  . DVT (deep venous thrombosis) ([place]) 1979    RLE  . Basal cell carcinoma   . Syncope and collapse 8-10 yrs ago  . Bradycardia vasovagal response 10 yrs ago    asymptomatic  . BPH (benign prostatic hyperplasia)   . Normal nuclear stress test May 2011  . Erectile dysfunction   . Diastolic dysfunction     Per echo in 2008  . Obesity   . Pulmonary embolism ([place]) 1979  . Arthritis     "knees, hips" (10/10/2014)  . History of gout   . History of nuclear stress test     ETT-Myoview 3/17: Hypertensive blood pressure response to exercise, normal  perfusion, EF 60%, low risk study    Past Surgical History  Procedure Laterality Date  . Cardiovascular stress test  May 2011    EF 73%; No ischemia  . Mohs surgery  6 yrs     nose  . Colonoscopy  2013  . Knee arthroscopy Right 10/03/2012    Procedure: RIGHT KNEE ARTHROSCOPY WITH DEBRIDEMENT;  Surgeon: [name], MD;  Location: [facility];  Service: Orthopedics;  Laterality: Right;  WITH DEBRIDEMENT  . 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 Joint replacement      Current Medications: Outpatient Prescriptions Prior to Visit  Medication Sig Dispense Refill  . aspirin 81 MG tablet Take 162 mg by mouth every morning.     Marland Kitchen atorvastatin (LIPITOR) 10 MG tablet Take 1 tablet (10 mg total) by mouth daily. 30 tablet 11  . fish oil-omega-3 fatty acids 1000 MG capsule Take 1 capsule by mouth daily as needed (when going out to eat).     Marland Kitchen Plant Sterol Stanol-Pantethine (CHOLESTOFF COMPLETE PO) Take 1 tablet by mouth 2 (two) times daily.      No facility-administered medications prior to visit.     Allergies:   Crestor   Social History   Social History  . Marital Status: Divorced  Spouse Name: N/A  . Number of Children: N/A  . Years of Education: N/A   Social History Main Topics  . Smoking status: Former Smoker -- 1.00 packs/day for 10 years    Types: Cigarettes    Quit date: 08/15/1977  . Smokeless tobacco: Never Used  . Alcohol Use: 4.2 oz/week    7 Shots of liquor per week  . Drug Use: No  . Sexual Activity: Yes   Other Topics Concern  . None   Social History Narrative     Family History:  The patient's He was adopted. Family history is unknown by patient.   ROS:   Please see the history of present illness.    Review of Systems  Cardiovascular: Positive for dyspnea on exertion.  Respiratory: Positive for snoring.    All other systems reviewed and are negative.   Physical Exam:    VS:  BP 142/82 mmHg  Pulse 64  Ht 5' 10.5" (1.791 m)  Wt 232 lb (105.235  kg)  BMI 32.81 kg/m2   GEN: Well nourished, well developed, in no acute distress HEENT: normal Neck: no JVD, no masses Cardiac: Normal S1/S2, RRR; no murmurs, rubs, or gallops, no edema   Respiratory:  clear to auscultation bilaterally; no wheezing, rhonchi or rales GI: soft, nontender, nondistended MS: no deformity or atrophy Skin: warm and dry Neuro: No focal deficits  Psych: Alert and oriented x 3, normal affect  Wt Readings from Last 3 Encounters:  11/30/15 232 lb (105.235 kg)  11/04/15 237 lb (107.502 kg)  10/26/15 237 lb (107.502 kg)      Studies/Labs Reviewed:     EKG:  EKG is not  ordered today.  The ekg ordered today demonstrates n/a  Recent Labs: 08/11/2015: Hemoglobin 14.5; Platelets 228 10/08/2015: ALT 18 10/26/2015: BUN 16; Creat 0.73; Potassium 4.5; Sodium 140; TSH 1.92   Recent Lipid Panel    Component Value Date/Time   CHOL 180 10/08/2015 0954   TRIG 99 10/08/2015 0954   HDL 39* 10/08/2015 0954   CHOLHDL 4.6 10/08/2015 0954   VLDL 20 10/08/2015 0954   LDLCALC 121 10/08/2015 0954   LDLDIRECT 125.7 09/25/2012 0914    Additional studies/ records that were reviewed today include:   Myoview 11/04/15 Low risk stress nuclear study with normal perfusion and normal left ventricular regional and global systolic function.  EF 60%. Low Risk. Hypertensive response to exercise (211/106)  Echo 09/26/14 EF 55-65%, Gr 1 DD   ASSESSMENT:     1. Shortness of breath   2. Elevated blood pressure   3. Snoring     PLAN:     In order of problems listed above:  1. Shortness of breath -  Recent BNP and nuclear stress test normal.  Breathing is somewhat better.  He has lost some weight and has been more active.  He has a remote hx of smoking.  He has some very mild wheezing at times.  Could consider PFTs in the future if needed.  Otherwise, continue to increase activity and work on weight loss.     2. Elevated BP - BP was elevated at his stress test and borderline  today.  He will continue to monitor and call with results in a few weeks.    3. Snoring - Split night sleep study was previously arranged and is pending.     Medication Adjustments/Labs and Tests Ordered: Current medicines are reviewed at length with the patient today.  Concerns regarding medicines are outlined above.  Medication changes, Labs and Tests ordered today are outlined in the Patient Instructions noted below. Patient Instructions  Medication Instructions:  Your physician recommends that you continue on your current medications as directed. Please refer to the Current Medication list given to you today. Labwork: NONE Testing/Procedures: NONE Follow-Up: KEEP APPT WITH [name] IN 01/2016 AS PLANNED Any Other Special Instructions Will Be Listed Below (If Applicable). MONITOR BP AND CALL WITH READINGS IN 2-3 WEEKS  If you need a refill on your cardiac medications before your next appointment, please call your pharmacy.     Signed, [name], PA-C  11/30/2015 9:58 AM    [facility] [place], [place], [place]  [address] Phone: [phone]; Fax: [phone]

## 2015-11-30 ENCOUNTER — Encounter: Payer: Self-pay | Admitting: Physician Assistant

## 2015-11-30 ENCOUNTER — Ambulatory Visit (INDEPENDENT_AMBULATORY_CARE_PROVIDER_SITE_OTHER): Payer: Medicare Other | Admitting: Physician Assistant

## 2015-11-30 VITALS — BP 142/82 | HR 64 | Ht 70.5 in | Wt 232.0 lb

## 2015-11-30 DIAGNOSIS — R030 Elevated blood-pressure reading, without diagnosis of hypertension: Secondary | ICD-10-CM | POA: Diagnosis not present

## 2015-11-30 DIAGNOSIS — R0602 Shortness of breath: Secondary | ICD-10-CM | POA: Diagnosis not present

## 2015-11-30 DIAGNOSIS — R0683 Snoring: Secondary | ICD-10-CM

## 2015-11-30 DIAGNOSIS — IMO0001 Reserved for inherently not codable concepts without codable children: Secondary | ICD-10-CM

## 2015-11-30 NOTE — Patient Instructions (Addendum)
Medication Instructions:  Your physician recommends that you continue on your current medications as directed. Please refer to the Current Medication list given to you today. Labwork: NONE Testing/Procedures: NONE Follow-Up: KEEP APPT WITH [name] IN 01/2016 AS PLANNED Any Other Special Instructions Will Be Listed Below (If Applicable). MONITOR BP AND CALL WITH READINGS IN 2-3 WEEKS  If you need a refill on your cardiac medications before your next appointment, please call your pharmacy.

## 2015-12-15 ENCOUNTER — Other Ambulatory Visit (INDEPENDENT_AMBULATORY_CARE_PROVIDER_SITE_OTHER): Payer: Medicare Other | Admitting: *Deleted

## 2015-12-15 DIAGNOSIS — E7800 Pure hypercholesterolemia, unspecified: Secondary | ICD-10-CM | POA: Diagnosis not present

## 2015-12-15 LAB — HEPATIC FUNCTION PANEL
ALT: 20 U/L (ref 9–46)
AST: 25 U/L (ref 10–35)
Albumin: 4 g/dL (ref 3.6–5.1)
Alkaline Phosphatase: 60 U/L (ref 40–115)
BILIRUBIN TOTAL: 0.6 mg/dL (ref 0.2–1.2)
Bilirubin, Direct: 0.2 mg/dL (ref <=0.2)
Indirect Bilirubin: 0.4 mg/dL (ref 0.2–1.2)
Total Protein: 6.4 g/dL (ref 6.1–8.1)

## 2015-12-15 LAB — LIPID PANEL
Cholesterol: 151 mg/dL (ref 125–200)
HDL: 44 mg/dL (ref >=40)
LDL CALC: 90 mg/dL (ref <130)
Total CHOL/HDL Ratio: 3.4 Ratio (ref <=5.0)
Triglycerides: 85 mg/dL (ref <150)
VLDL: 17 mg/dL (ref <30)

## 2015-12-16 ENCOUNTER — Telehealth: Payer: Self-pay | Admitting: *Deleted

## 2015-12-16 NOTE — Telephone Encounter (Signed)
Pt has been notified lab results by phone with verbal understanding. Pt asked for me to please call his home # and to please leave lipid readings on anserwing machine so that his girlfriend can hear these as well.

## 2015-12-16 NOTE — Telephone Encounter (Signed)
No answer on home or cell #. Could not lm on either. I will try to call back later to go over results.

## 2015-12-19 ENCOUNTER — Ambulatory Visit (HOSPITAL_BASED_OUTPATIENT_CLINIC_OR_DEPARTMENT_OTHER): Payer: Medicare Other | Attending: Physician Assistant

## 2016-02-04 ENCOUNTER — Ambulatory Visit: Payer: Medicare Other | Admitting: Cardiovascular Disease

## 2016-03-01 ENCOUNTER — Encounter: Payer: Self-pay | Admitting: *Deleted

## 2016-03-17 NOTE — Progress Notes (Signed)
Cardiology Office Note:    Date:  03/18/2016   ID:  Joe Davis, DOB [birth year], MRN [id number]  PCP:  Joe Post, MD  Cardiologist:  Dr. Darlin Davis >> Dr. Jenkins Davis   Electrophysiologist:  n/a  Chief Complaint  Patient presents with  . Heart Murmur    bi-lateral swelling in ankles, this is a issue and has been, per pt    History of Present Illness:     Joe Davis is a 70 y.o. male with a hx of diastolic dysfunction, remote DVT and pulmonary embolism, HL, atypical chest pain. He is status Davis right THR at [facility] in 2/16. He has a history of vasovagal syncope while taking a hot shower several days after his hip surgery. Last seen by Dr. Mare Davis 2/17.  Seen by PA  10/26/15 for complaints of shortness of breath. His long time companion, Joe Davis is a Therapist, sports in anesthesia at [employer].  She noted a gradually worsening of DOE over the past few years.  BNP and TSH was normal. Stress Myoview was low risk and negative for ischemia. He did have a hypertensive blood pressure response with exercise. He complained of snoring and there was witnessed apneic episodes. 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    Significant other Joe Davis is a Immunologist at [employer]   Past Medical History:  Diagnosis Date  . Arthritis    "knees, hips" (10/10/2014)  . Basal cell carcinoma   . BPH (benign prostatic hyperplasia)   . Bradycardia vasovagal response 10 yrs ago   asymptomatic  . Diastolic dysfunction    Per echo in 2008  . DVT (deep venous thrombosis) ([place]) 1979   RLE  . Erectile dysfunction   . History of gout   . History of nuclear stress test    ETT-Myoview 3/17: Hypertensive blood pressure response to exercise, normal perfusion, EF 60%, low risk study  . Hypercholesterolemia    ? statin intolerant. May tolerate Lipitor  . Normal nuclear stress test May 2011  . Obesity   . Pulmonary embolism ([place]) 1979  . Syncope and collapse 8-10 yrs ago     Past Surgical History:  Procedure Laterality Date  . CARDIOVASCULAR STRESS TEST  May 2011   EF 73%; No ischemia  . COLONOSCOPY  2013  . JOINT REPLACEMENT    . KNEE ARTHROSCOPY Right 10/03/2012   Procedure: RIGHT KNEE ARTHROSCOPY WITH DEBRIDEMENT;  Surgeon: Joe Alf, MD;  Location: [facility];  Service: Orthopedics;  Laterality: Right;  WITH DEBRIDEMENT  . MOHS SURGERY  6 yrs    nose  . TOTAL HIP ARTHROPLASTY Right 10/06/2014    Current Medications: Outpatient Medications Prior to Visit  Medication Sig Dispense Refill  . aspirin 81 MG tablet Take 162 mg by mouth every morning.     Marland Kitchen atorvastatin (LIPITOR) 10 MG tablet Take 1 tablet (10 mg total) by mouth daily. 30 tablet 11  . fish oil-omega-3 fatty acids 1000 MG capsule Take 1 capsule by mouth daily as needed (when going out to eat).     Marland Kitchen Plant Sterol Stanol-Pantethine (CHOLESTOFF COMPLETE PO) Take 1 tablet by mouth 2 (two) times daily.      No facility-administered medications prior to visit.      Allergies:   Crestor [rosuvastatin calcium]   Social History   Social History  . Marital status: Divorced    Spouse name: N/A  . Number of children:  N/A  . Years of education: N/A   Social History Main Topics  . Smoking status: Former Smoker    Packs/day: 1.00    Years: 10.00    Types: Cigarettes    Quit date: 08/15/1977  . Smokeless tobacco: Never Used  . Alcohol use 4.2 oz/week    7 Shots of liquor per week  . Drug use: No  . Sexual activity: Yes   Other Topics Concern  . None   Social History Narrative  . None     Family History:  The patient's He was adopted. Family history is unknown by patient.   ROS:   Please see the history of present illness.    Review of Systems  Cardiovascular: Positive for dyspnea on exertion.  Respiratory: Positive for snoring.    All other systems reviewed and are negative.   Physical Exam:    VS:  BP (!) 142/74 (BP Location: Right Arm, Patient Position: Sitting, Cuff  Size: Large)   Pulse 61   Ht 5' 10.5" (1.791 m)   Wt 234 lb 12.8 oz (106.5 kg)   SpO2 95%   BMI 33.21 kg/m    GEN: Well nourished, well developed, in no acute distress  HEENT: normal  Neck: no JVD, no masses Cardiac: Normal S1/S2, RRR; no murmurs, rubs, or gallops, no edema   Respiratory:  clear to auscultation bilaterally; no wheezing, rhonchi or rales GI: soft, nontender, nondistended MS: no deformity or atrophy  Skin: warm and dry Neuro: No focal deficits  Psych: Alert and oriented x 3, normal affect  Wt Readings from Last 3 Encounters:  03/18/16 234 lb 12.8 oz (106.5 kg)  11/30/15 232 lb (105.2 kg)  11/04/15 237 lb (107.5 kg)      Studies/Labs Reviewed:     EKG:  EKG is not  ordered today.  The ekg ordered today demonstrates n/a  Recent Labs: 08/11/2015: Hemoglobin 14.5; Platelets 228 10/26/2015: Brain Natriuretic Peptide 25.7; BUN 16; Creat 0.73; Potassium 4.5; Sodium 140; TSH 1.92 12/15/2015: ALT 20   Recent Lipid Panel    Component Value Date/Time   CHOL 151 12/15/2015 0802   TRIG 85 12/15/2015 0802   HDL 44 12/15/2015 0802   CHOLHDL 3.4 12/15/2015 0802   VLDL 17 12/15/2015 0802   LDLCALC 90 12/15/2015 0802   LDLDIRECT 125.7 09/25/2012 0914    Additional studies/ records that were reviewed today include:   Myoview 11/04/15 Low risk stress nuclear study with normal perfusion and normal left ventricular regional and global systolic function.  EF 60%. Low Risk. Hypertensive response to exercise (211/106)  Echo 09/26/14 EF 55-65%, Gr 1 DD   ASSESSMENT:     Dyspnea   PLAN:     In order of problems listed above:  1. Shortness of breath -  Recent BNP and nuclear stress test normal.  Breathing is somewhat better.  He has lost some weight and has been more active.  He has a remote hx of smoking.  He has some very mild wheezing at times.  Could consider PFTs in the future if needed.  Otherwise, continue to increase activity and work on weight loss.     2.  Elevated BP - BP was elevated at his stress test and borderline today.  Given chronic LE edema will start HCTZ 12.5 mg f/u BMET 4 weeks   3. Snoring - Split night sleep study was previously arranged and is pending.  Gave him script for study if he wants to try to get  it at [facility] to wear CPAP if needed    4. Edema:  Dependant with previous DVT LLE and varicosities  Elevate legs Low sodium diet compression stockings HCTZ    Joe Davis

## 2016-03-18 ENCOUNTER — Ambulatory Visit (INDEPENDENT_AMBULATORY_CARE_PROVIDER_SITE_OTHER): Payer: Medicare Other | Admitting: Cardiovascular Disease

## 2016-03-18 ENCOUNTER — Encounter: Payer: Self-pay | Admitting: Cardiovascular Disease

## 2016-03-18 ENCOUNTER — Encounter (INDEPENDENT_AMBULATORY_CARE_PROVIDER_SITE_OTHER): Payer: Self-pay

## 2016-03-18 VITALS — BP 142/74 | HR 61 | Ht 70.5 in | Wt 234.8 lb

## 2016-03-18 DIAGNOSIS — Z79899 Other long term (current) drug therapy: Secondary | ICD-10-CM | POA: Diagnosis not present

## 2016-03-18 DIAGNOSIS — R011 Cardiac murmur, unspecified: Secondary | ICD-10-CM | POA: Diagnosis not present

## 2016-03-18 MED ORDER — HYDROCHLOROTHIAZIDE 12.5 MG PO CAPS: 12.5000 mg | ORAL_CAPSULE | Freq: Every day | ORAL | 3 refills | Status: DC

## 2016-03-18 NOTE — Patient Instructions (Addendum)
Medication Instructions:  Your physician has recommended you make the following change in your medication:  1-START HCTZ  Hydrochlorothiazide 12.5 mg by mouth daily  Labwork: Your physician recommends that you return for lab work in: 4 weeks for BMET   Testing/Procedures: NONE  Follow-Up: Your physician wants you to follow-up in: 6 months with [name]. You will receive a reminder letter in the mail two months in advance. If you don't receive a letter, please call our office to schedule the follow-up appointment.   If you need a refill on your cardiac medications before your next appointment, please call your pharmacy.

## 2016-04-21 ENCOUNTER — Other Ambulatory Visit: Payer: Medicare Other | Admitting: *Deleted

## 2016-04-21 DIAGNOSIS — Z79899 Other long term (current) drug therapy: Secondary | ICD-10-CM

## 2016-04-21 LAB — BASIC METABOLIC PANEL
BUN: 17 mg/dL (ref 7–25)
CALCIUM: 8.9 mg/dL (ref 8.6–10.3)
CO2: 24 mmol/L (ref 20–31)
Chloride: 105 mmol/L (ref 98–110)
Creat: 0.77 mg/dL (ref 0.70–1.18)
GLUCOSE: 104 mg/dL — AB (ref 65–99)
Potassium: 4.4 mmol/L (ref 3.5–5.3)
SODIUM: 141 mmol/L (ref 135–146)

## 2016-04-25 ENCOUNTER — Telehealth: Payer: Self-pay | Admitting: Cardiovascular Disease

## 2016-04-25 NOTE — Telephone Encounter (Signed)
New message    Pt is returning nurse call.

## 2016-04-25 NOTE — Telephone Encounter (Signed)
Called patient back to give him lab results.

## 2016-04-26 DIAGNOSIS — H524 Presbyopia: Secondary | ICD-10-CM | POA: Diagnosis not present

## 2016-04-26 DIAGNOSIS — H3540 Unspecified peripheral retinal degeneration: Secondary | ICD-10-CM | POA: Diagnosis not present

## 2016-04-26 DIAGNOSIS — H3589 Other specified retinal disorders: Secondary | ICD-10-CM | POA: Diagnosis not present

## 2016-04-26 DIAGNOSIS — H25813 Combined forms of age-related cataract, bilateral: Secondary | ICD-10-CM | POA: Diagnosis not present

## 2016-04-26 DIAGNOSIS — H33301 Unspecified retinal break, right eye: Secondary | ICD-10-CM | POA: Diagnosis not present

## 2016-04-26 DIAGNOSIS — H5213 Myopia, bilateral: Secondary | ICD-10-CM | POA: Diagnosis not present

## 2016-04-26 DIAGNOSIS — H5340 Unspecified visual field defects: Secondary | ICD-10-CM | POA: Diagnosis not present

## 2016-04-26 DIAGNOSIS — H52223 Regular astigmatism, bilateral: Secondary | ICD-10-CM | POA: Diagnosis not present

## 2016-05-24 DIAGNOSIS — H40013 Open angle with borderline findings, low risk, bilateral: Secondary | ICD-10-CM | POA: Diagnosis not present

## 2016-05-24 DIAGNOSIS — H5340 Unspecified visual field defects: Secondary | ICD-10-CM | POA: Diagnosis not present

## 2016-05-24 DIAGNOSIS — H401121 Primary open-angle glaucoma, left eye, mild stage: Secondary | ICD-10-CM | POA: Diagnosis not present

## 2016-05-24 DIAGNOSIS — H401111 Primary open-angle glaucoma, right eye, mild stage: Secondary | ICD-10-CM | POA: Diagnosis not present

## 2016-06-28 ENCOUNTER — Telehealth: Payer: Self-pay | Admitting: Family Medicine

## 2016-06-28 NOTE — Telephone Encounter (Signed)
Pts wife is calling stating that the pts gout has flared and the the medication that he has on hand has expired and would like to have INDOCIN the pharmacy gave them 1 pill last night and it helped.  Pharm:  [facility]

## 2016-06-29 ENCOUNTER — Ambulatory Visit: Payer: Medicare Other | Admitting: *Deleted

## 2016-06-29 ENCOUNTER — Encounter: Payer: Self-pay | Admitting: Family Medicine

## 2016-06-29 ENCOUNTER — Ambulatory Visit (INDEPENDENT_AMBULATORY_CARE_PROVIDER_SITE_OTHER): Payer: Medicare Other | Admitting: Family Medicine

## 2016-06-29 VITALS — BP 142/88 | HR 74 | Ht 70.5 in | Wt 230.0 lb

## 2016-06-29 DIAGNOSIS — M109 Gout, unspecified: Secondary | ICD-10-CM

## 2016-06-29 DIAGNOSIS — Z23 Encounter for immunization: Secondary | ICD-10-CM

## 2016-06-29 MED ORDER — PREDNISONE 10 MG PO TABS: ORAL_TABLET | ORAL | 0 refills | Status: DC

## 2016-06-29 NOTE — Progress Notes (Signed)
Subjective:     Patient ID: Joe Davis, male   DOB: 1946/01/25, 70 y.o.   MRN: [id number]  HPI Patient seen with concern for acute gout right foot. He has known history of gout and this past Sunday noticed some redness swelling and warmth of the right metatarsophalangeal joint. No injury. No fevers or chills. Over the weekend he ate lots of shrimp and crab. He had one indomethacin tablet which he took with minimal change. He states that he has infrequent flareup with gout usually only about once every 2 years. He has taken both indomethacin and prednisone in the past which have helped acutely. He does take HCTZ per cardiology for blood pressure. Occasional bourbon use. No history of diabetes  Past Medical History:  Diagnosis Date  . Arthritis    "knees, hips" (10/10/2014)  . Basal cell carcinoma   . BPH (benign prostatic hyperplasia)   . Bradycardia vasovagal response 10 yrs ago   asymptomatic  . Diastolic dysfunction    Per echo in 2008  . DVT (deep venous thrombosis) ([place]) 1979   RLE  . Erectile dysfunction   . History of gout   . History of nuclear stress test    ETT-Myoview 3/17: Hypertensive blood pressure response to exercise, normal perfusion, EF 60%, low risk study  . Hypercholesterolemia    ? statin intolerant. May tolerate Lipitor  . Normal nuclear stress test May 2011  . Obesity   . Pulmonary embolism ([place]) 1979  . Syncope and collapse 8-10 yrs ago   Past Surgical History:  Procedure Laterality Date  . CARDIOVASCULAR STRESS TEST  May 2011   EF 73%; No ischemia  . COLONOSCOPY  2013  . JOINT REPLACEMENT    . KNEE ARTHROSCOPY Right 10/03/2012   Procedure: RIGHT KNEE ARTHROSCOPY WITH DEBRIDEMENT;  Surgeon: [name], MD;  Location: [facility];  Service: Orthopedics;  Laterality: Right;  WITH DEBRIDEMENT  . MOHS SURGERY  6 yrs    nose  . TOTAL HIP ARTHROPLASTY Right 10/06/2014    reports that he quit smoking about 38 years ago. His smoking use included Cigarettes. He  has a 10.00 pack-year smoking history. He has never used smokeless tobacco. He reports that he drinks about 4.2 oz of alcohol per week . He reports that he does not use drugs. He was adopted. Family history is unknown by patient. Allergies  Allergen Reactions  . Crestor [Rosuvastatin Calcium]     LEG PAIN     Review of Systems  Constitutional: Negative for chills and fever.  Respiratory: Negative for shortness of breath.   Cardiovascular: Negative for chest pain.  Musculoskeletal: Negative for gait problem.       Objective:   Physical Exam  Constitutional: He appears well-developed and well-nourished.  Cardiovascular: Normal rate and regular rhythm.   Pulmonary/Chest: Effort normal and breath sounds normal. No respiratory distress. He has no wheezes. He has no rales.  Musculoskeletal: He exhibits no edema.  Right foot reveals mild erythema metatarsophalangeal joint with minimal warmth and mild tenderness to palpation. Good distal foot pulses       Assessment:     Acute gout involving right foot metatarsophalangeal joint    Plan:     -Stay well-hydrated -Avoid alcohol -Dietary factors discussed -Prednisone taper -We discussed possible discontinuation of HCTZ if he has more frequent flareups in the future -we discussed prophylaxis but he is not interested since he only generally gets flares about every 2 years.  [name]  MD [facility]

## 2016-09-19 DIAGNOSIS — N644 Mastodynia: Secondary | ICD-10-CM | POA: Diagnosis not present

## 2016-09-27 ENCOUNTER — Other Ambulatory Visit: Payer: Self-pay | Admitting: Nurse Practitioner

## 2016-09-27 DIAGNOSIS — N63 Unspecified lump in unspecified breast: Secondary | ICD-10-CM

## 2016-09-27 DIAGNOSIS — N644 Mastodynia: Secondary | ICD-10-CM

## 2016-09-30 ENCOUNTER — Ambulatory Visit
Admission: RE | Admit: 2016-09-30 | Discharge: 2016-09-30 | Disposition: A | Discharge status: Home / Self Care | Payer: Medicare Other | Source: Ambulatory Visit | Attending: Nurse Practitioner | Admitting: Nurse Practitioner

## 2016-09-30 DIAGNOSIS — N63 Unspecified lump in unspecified breast: Secondary | ICD-10-CM

## 2016-09-30 DIAGNOSIS — N644 Mastodynia: Secondary | ICD-10-CM | POA: Diagnosis not present

## 2016-10-02 NOTE — Progress Notes (Signed)
Cardiology Office Note:    Date:  10/13/2016   ID:  Joe Davis, DOB 1945-10-13, MRN [id number]  PCP:  [name], MD  Cardiologist:  [name] >> [name]   Electrophysiologist:  n/a  No chief complaint on file.   History of Present Illness:     Joe Davis is a 71 y.o. male with a hx of diastolic dysfunction, remote DVT and pulmonary embolism, HL, atypical chest pain. He is status post right THR at [facility] in 2/16. He has a history of vasovagal syncope while taking a hot shower several days after his hip surgery. Last seen by Dr. Mare Ferrari 2/17.  Seen by PA  10/26/15 for complaints of shortness of breath. His long time companion, [name] is a Therapist, sports in anesthesia at [facility].  She noted a gradually worsening of DOE over the past few years.  BNP and TSH was normal. 11/04/15  Stress Myoview was low risk and negative for ischemia. He did have a hypertensive blood pressure response with exercise. He complained of snoring and there was witnessed apneic episodes.   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    Significant other Remo Lipps is a Immunologist at [employer]   Past Medical History:  Diagnosis Date  . Arthritis    "knees, hips" (10/10/2014)  . Basal cell carcinoma   . BPH (benign prostatic hyperplasia)   . Bradycardia vasovagal response 10 yrs ago   asymptomatic  . Diastolic dysfunction    Per echo in 2008  . DVT (deep venous thrombosis) ([place]) 1979   RLE  . Erectile dysfunction   . History of gout   . History of nuclear stress test    ETT-Myoview 3/17: Hypertensive blood pressure response to exercise, normal perfusion, EF 60%, low risk study  . Hypercholesterolemia    ? statin intolerant. May tolerate Lipitor  . Normal nuclear stress test May 2011  . Obesity   . Pulmonary embolism ([place]) 1979  . Syncope and collapse 8-10 yrs ago    Past Surgical History:  Procedure Laterality Date  . CARDIOVASCULAR STRESS TEST  May 2011   EF 73%; No  ischemia  . COLONOSCOPY  2013  . JOINT REPLACEMENT    . KNEE ARTHROSCOPY Right 10/03/2012   Procedure: RIGHT KNEE ARTHROSCOPY WITH DEBRIDEMENT;  Surgeon: [name], MD;  Location[facility];  Service: Orthopedics;  Laterality: Right;  WITH DEBRIDEMENT  . MOHS SURGERY  6 yrs    nose  . TOTAL HIP ARTHROPLASTY Right 10/06/2014    Current Medications: Outpatient Medications Prior to Visit  Medication Sig Dispense Refill  . aspirin 81 MG tablet Take 162 mg by mouth every morning.     Marland Kitchen atorvastatin (LIPITOR) 10 MG tablet Take 1 tablet (10 mg total) by mouth daily. 30 tablet 11  . fish oil-omega-3 fatty acids 1000 MG capsule Take 1 capsule by mouth daily as needed (when going out to eat).     . hydrochlorothiazide (MICROZIDE) 12.5 MG capsule Take 1 capsule (12.5 mg total) by mouth daily. 90 capsule 3  . Plant Sterol Stanol-Pantethine (CHOLESTOFF COMPLETE PO) Take 1 tablet by mouth 2 (two) times daily.     . tadalafil (CIALIS) 10 MG tablet Take 10 mg by mouth daily as needed for erectile dysfunction.    . predniSONE (DELTASONE) 10 MG tablet Taper as follows: 4-4-4-3-3-2-2 (Patient not taking: Reported on 10/13/2016) 22 tablet 0   No facility-administered medications prior  to visit.      Allergies:   Crestor [rosuvastatin calcium]   Social History   Social History  . Marital status: Divorced    Spouse name: N/A  . Number of children: N/A  . Years of education: N/A   Social History Main Topics  . Smoking status: Former Smoker    Packs/day: 1.00    Years: 10.00    Types: Cigarettes    Quit date: 08/15/1977  . Smokeless tobacco: Never Used  . Alcohol use 4.2 oz/week    7 Shots of liquor per week  . Drug use: No  . Sexual activity: Yes   Other Topics Concern  . None   Social History Narrative  . None     Family History:  The patient's He was adopted. Family history is unknown by patient.   ROS:   Please see the history of present illness.    Review of Systems    Cardiovascular: Positive for dyspnea on exertion.  Respiratory: Positive for snoring.    All other systems reviewed and are negative.   Physical Exam:    VS:  BP 124/78   Pulse (!) 54   Ht 5' 10.5" (1.791 m)   Wt 231 lb (104.8 kg)   BMI 32.68 kg/m    Obese white male   HEENT: normal  Neck: no JVD, no masses Cardiac: Normal S1/S2, RRR; no murmurs, rubs, or gallops, no edema   Respiratory:  clear to auscultation bilaterally; no wheezing, rhonchi or rales GI: soft, nontender, nondistended MS: no deformity or atrophy  Skin: warm and dry Neuro: No focal deficits  Psych: Alert and oriented x 3, normal affect Plus 2 RLE edema plus one with varicose veins LLE   Wt Readings from Last 3 Encounters:  11/08/16 231 lb (104.8 kg)  06/29/16 230 lb (104.3 kg)  03/18/16 234 lb 12.8 oz (106.5 kg)      Studies/Labs Reviewed:     EKG:  Nov 08, 2016 SR rate 54 LAD otherwise normal   Recent Labs: 10/26/2015: Brain Natriuretic Peptide 25.7; TSH 1.92 12/15/2015: ALT 20 04/21/2016: BUN 17; Creat 0.77; Potassium 4.4; Sodium 141   Recent Lipid Panel    Component Value Date/Time   CHOL 151 12/15/2015 0802   TRIG 85 12/15/2015 0802   HDL 44 12/15/2015 0802   CHOLHDL 3.4 12/15/2015 0802   VLDL 17 12/15/2015 0802   LDLCALC 90 12/15/2015 0802   LDLDIRECT 125.7 09/25/2012 0914    Additional studies/ records that were reviewed today include:   Myoview 11/04/15 Low risk stress nuclear study with normal perfusion and normal left ventricular regional and global systolic function.  EF 60%. Low Risk. Hypertensive response to exercise (211/106)  Echo 09/26/14 EF 55-65%, Gr 1 DD   ASSESSMENT:     Dyspnea   PLAN:     In order of problems listed above:  1. Shortness of breath -  11/04/15  BNP and nuclear stress test normal.  Breathing is somewhat better.  He has lost some weight and has been more active.  He has a remote hx of smoking.  He has some very mild wheezing at times.  Could consider PFTs  in the future if needed.  Otherwise, continue to increase activity and work on weight loss.     2. Elevated BP - BP was elevated at his stress test normal today and better with diuretic   3. Snoring - Split night sleep study and f/u with Dr Radford Pax   4. Edema:  Dependant with previous DVT LLE and varicosities  Elevate legs Low sodium diet compression stockings HCTZ  Suggested [facility] For varicosities   5. Chol:  Repeat lab work today  Lab Results  Component Value Date   Saxis 90 12/15/2015     Jenkins Rouge

## 2016-10-13 ENCOUNTER — Encounter: Payer: Self-pay | Admitting: Cardiovascular Disease

## 2016-10-13 ENCOUNTER — Ambulatory Visit (INDEPENDENT_AMBULATORY_CARE_PROVIDER_SITE_OTHER): Payer: Medicare Other | Admitting: Cardiovascular Disease

## 2016-10-13 VITALS — BP 124/78 | HR 54 | Ht 70.5 in | Wt 231.0 lb

## 2016-10-13 DIAGNOSIS — R0600 Dyspnea, unspecified: Secondary | ICD-10-CM

## 2016-10-13 DIAGNOSIS — E7849 Other hyperlipidemia: Secondary | ICD-10-CM

## 2016-10-13 DIAGNOSIS — R0683 Snoring: Secondary | ICD-10-CM | POA: Diagnosis not present

## 2016-10-13 DIAGNOSIS — E784 Other hyperlipidemia: Secondary | ICD-10-CM

## 2016-10-13 LAB — HEPATIC FUNCTION PANEL
ALT: 24 IU/L (ref 0–44)
AST: 28 IU/L (ref 0–40)
Albumin: 4.3 g/dL (ref 3.5–4.8)
Alkaline Phosphatase: 67 IU/L (ref 39–117)
BILIRUBIN TOTAL: 0.6 mg/dL (ref 0.0–1.2)
BILIRUBIN, DIRECT: 0.21 mg/dL (ref 0.00–0.40)
Total Protein: 6.4 g/dL (ref 6.0–8.5)

## 2016-10-13 LAB — LIPID PANEL
CHOLESTEROL TOTAL: 137 mg/dL (ref 100–199)
Chol/HDL Ratio: 3.1 ratio units (ref 0.0–5.0)
HDL: 44 mg/dL (ref >39)
LDL CALC: 81 mg/dL (ref 0–99)
TRIGLYCERIDES: 62 mg/dL (ref 0–149)
VLDL Cholesterol Cal: 12 mg/dL (ref 5–40)

## 2016-10-13 LAB — PRO B NATRIURETIC PEPTIDE: NT-PRO BNP: 62 pg/mL (ref 0–376)

## 2016-10-13 NOTE — Patient Instructions (Signed)
Medication Instructions:  Your physician recommends that you continue on your current medications as directed. Please refer to the Current Medication list given to you today.   Labwork: Lab work to be done today--Lipid and liver profiles, BNP  Testing/Procedures: Your physician has recommended that you have a sleep study. This test records several body functions during sleep, including: brain activity, eye movement, oxygen and carbon dioxide blood levels, heart rate and rhythm, breathing rate and rhythm, the flow of air through your mouth and nose, snoring, body muscle movements, and chest and belly movement.    Follow-Up: Your physician wants you to follow-up in: 6 months.  You will receive a reminder letter in the mail two months in advance. If you don't receive a letter, please call our office to schedule the follow-up appointment.   You have been referred to Dr. Radford Pax.  To be arranged after sleep study results available.    Any Other Special Instructions Will Be Listed Below (If Applicable).     If you need a refill on your cardiac medications before your next appointment, please call your pharmacy.

## 2016-10-13 NOTE — Addendum Note (Signed)
Addended by: Eulis Foster on: 10/13/2016 09:33 AM   Modules accepted: Orders

## 2016-10-13 NOTE — Addendum Note (Signed)
Addended by: Eulis Foster on: 10/13/2016 09:32 AM   Modules accepted: Orders

## 2016-11-03 ENCOUNTER — Telehealth: Payer: Self-pay | Admitting: Cardiovascular Disease

## 2016-11-03 NOTE — Telephone Encounter (Signed)
New message    Pt is calling returning call to [facility].

## 2016-11-03 NOTE — Telephone Encounter (Signed)
Left message for patient to call back  

## 2016-11-07 DIAGNOSIS — N5201 Erectile dysfunction due to arterial insufficiency: Secondary | ICD-10-CM | POA: Diagnosis not present

## 2016-11-07 DIAGNOSIS — Z125 Encounter for screening for malignant neoplasm of prostate: Secondary | ICD-10-CM | POA: Diagnosis not present

## 2016-11-07 DIAGNOSIS — R351 Nocturia: Secondary | ICD-10-CM | POA: Diagnosis not present

## 2016-11-07 DIAGNOSIS — N401 Enlarged prostate with lower urinary tract symptoms: Secondary | ICD-10-CM | POA: Diagnosis not present

## 2016-11-17 NOTE — Telephone Encounter (Signed)
Notes Recorded by Michae Kava, CMA on 11/06/2015 at 10:24 AM Pt notified of myoview results. Pt notified BP increased alot during exercise portion of myoview. Advised per PA check his BP several times and bring those readings to his next office visit. Pt agreeable to plan of care. ------  Notes Recorded by Liliane Shi, PA-C on 11/05/2015 at 1:48 PM Please tell patient his stress test is ok. BP did increase a lot during exercise. If he can, please have him check his BP several times and bring those readings to his next office visit. [name], PA-C  11/05/2015 1:46 PM   Patient is aware of test results according to notes above.

## 2016-11-23 ENCOUNTER — Other Ambulatory Visit: Payer: Self-pay | Admitting: Physician Assistant

## 2016-11-23 ENCOUNTER — Ambulatory Visit (HOSPITAL_BASED_OUTPATIENT_CLINIC_OR_DEPARTMENT_OTHER): Payer: Medicare Other | Attending: Cardiovascular Disease | Admitting: Cardiology

## 2016-11-23 VITALS — Ht 70.5 in | Wt 231.0 lb

## 2016-11-23 DIAGNOSIS — I493 Ventricular premature depolarization: Secondary | ICD-10-CM | POA: Insufficient documentation

## 2016-11-23 DIAGNOSIS — R0600 Dyspnea, unspecified: Secondary | ICD-10-CM

## 2016-11-23 DIAGNOSIS — H401111 Primary open-angle glaucoma, right eye, mild stage: Secondary | ICD-10-CM | POA: Diagnosis not present

## 2016-11-23 DIAGNOSIS — G4736 Sleep related hypoventilation in conditions classified elsewhere: Secondary | ICD-10-CM | POA: Diagnosis not present

## 2016-11-23 DIAGNOSIS — R0683 Snoring: Secondary | ICD-10-CM | POA: Diagnosis not present

## 2016-11-23 DIAGNOSIS — E7800 Pure hypercholesterolemia, unspecified: Secondary | ICD-10-CM

## 2016-11-23 DIAGNOSIS — G4733 Obstructive sleep apnea (adult) (pediatric): Secondary | ICD-10-CM | POA: Diagnosis not present

## 2016-11-23 DIAGNOSIS — E7849 Other hyperlipidemia: Secondary | ICD-10-CM

## 2016-11-23 DIAGNOSIS — Z7982 Long term (current) use of aspirin: Secondary | ICD-10-CM | POA: Insufficient documentation

## 2016-11-23 DIAGNOSIS — H401121 Primary open-angle glaucoma, left eye, mild stage: Secondary | ICD-10-CM | POA: Diagnosis not present

## 2016-11-27 NOTE — Procedures (Signed)
   Patient Name: Derran, Sear Date: 11/23/2016 Gender: Male D.O.B: 25-Jul-1946 Age (years): 70 Referring Provider: Flonnie Overman Dhungel Height (inches): 71 Interpreting Physician: Fransico Him MD, ABSM Weight (lbs): 233 RPSGT: Laren Everts BMI: 33 MRN: [id number] Neck Size: 18.00  CLINICAL INFORMATION Sleep Study Type: NPSG  Indication for sleep study: Obesity, Snoring, Witnessed Apneas  Epworth Sleepiness Score: 5  SLEEP STUDY TECHNIQUE As per the AASM Manual for the Scoring of Sleep and Associated Events v2.3 (April 2016) with a hypopnea requiring 4% desaturations.  The channels recorded and monitored were frontal, central and occipital EEG, electrooculogram (EOG), submentalis EMG (chin), nasal and oral airflow, thoracic and abdominal wall motion, anterior tibialis EMG, snore microphone, electrocardiogram, and pulse oximetry.  MEDICATIONS Medications self-administered by patient taken the night of the study : [place] The study was initiated at 10:44:34 PM and ended at 4:55:16 AM.  Sleep onset time was 36.4 minutes and the sleep efficiency was 62.6%. The total sleep time was 232.0 minutes.  Stage REM latency was 143.5 minutes.  The patient spent 7.54% of the night in stage N1 sleep, 71.98% in stage N2 sleep, 0.00% in stage N3 and 20.47% in REM.  Alpha intrusion was absent.  Supine sleep was 1.08%.  RESPIRATORY PARAMETERS The overall apnea/hypopnea index (AHI) was 10.1 per hour. There were 4 total apneas, including 2 obstructive, 2 central and 0 mixed apneas. There were 35 hypopneas and 30 RERAs.  The AHI during Stage REM sleep was 39.2 per hour.  AHI while supine was 0.0 per hour.  The mean oxygen saturation was 93.65%. The minimum SpO2 during sleep was 81.00%.  Moderate snoring was noted during this study.  CARDIAC DATA The 2 lead EKG demonstrated sinus rhythm. The mean heart rate was 62.43 beats per minute. Other EKG findings  include: PVCs.  LEG MOVEMENT DATA The total PLMS were 158 with a resulting PLMS index of 40.86. Associated arousal with leg movement index was 2.8 .  IMPRESSIONS - Mild obstructive sleep apnea occurred during this study (AHI = 10.1/h). - No significant central sleep apnea occurred during this study (CAI = 0.5/h). - Moderate oxygen desaturation was noted during this study (Min O2 = 81.00%). - The patient snored with Moderate snoring volume. - EKG findings include PVCs. - Moderate periodic limb movements of sleep occurred during the study. No significant associated arousals.  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 - Avoid alcohol, sedatives and other CNS depressants that may worsen sleep apnea and disrupt normal sleep architecture. - Sleep hygiene should be reviewed to assess factors that may improve sleep quality. - Weight management and regular exercise should be initiated or continued if appropriate.  [place], American Board of Sleep Medicine  ELECTRONICALLY SIGNED ON:  11/27/2016, 5:43 PM [place] PH: [phone]   FX: [phone] [place]

## 2016-12-06 ENCOUNTER — Telehealth: Payer: Self-pay | Admitting: *Deleted

## 2016-12-06 NOTE — Telephone Encounter (Addendum)
Called the patient to set up his cpap titration. LMTCB   Informed patient of sleep study results and patient understanding was verbalized. Informed patient of CPAP titration recommendation made by Dr. Radford Pax and  understanding was verbalized. Patient has requested to see his sleep study before deciding on the titration. Informed patient he has to sign a patient information release form to receive his sleep study. Patient will notify us when he will visit our office to sign that form and pick up his sleep study.

## 2016-12-06 NOTE — Telephone Encounter (Signed)
-----   Message from Sueanne Margarita, MD sent at 11/27/2016  5:46 PM EDT ----- Please let patient know that they have sleep apnea and recommend CPAP titration. Please set up titration in the sleep lab.

## 2017-03-21 ENCOUNTER — Other Ambulatory Visit: Payer: Self-pay | Admitting: Cardiovascular Disease

## 2017-05-15 ENCOUNTER — Other Ambulatory Visit: Payer: Self-pay | Admitting: Cardiovascular Disease

## 2017-05-15 DIAGNOSIS — E7800 Pure hypercholesterolemia, unspecified: Secondary | ICD-10-CM

## 2017-05-17 NOTE — Telephone Encounter (Signed)
Rx refill sent to pharmacy. 

## 2017-05-24 DIAGNOSIS — M4807 Spinal stenosis, lumbosacral region: Secondary | ICD-10-CM | POA: Diagnosis not present

## 2017-06-26 DIAGNOSIS — H401131 Primary open-angle glaucoma, bilateral, mild stage: Secondary | ICD-10-CM | POA: Diagnosis not present

## 2017-06-26 DIAGNOSIS — H2513 Age-related nuclear cataract, bilateral: Secondary | ICD-10-CM | POA: Diagnosis not present

## 2017-06-26 DIAGNOSIS — H43812 Vitreous degeneration, left eye: Secondary | ICD-10-CM | POA: Diagnosis not present

## 2017-06-26 DIAGNOSIS — H524 Presbyopia: Secondary | ICD-10-CM | POA: Diagnosis not present

## 2017-06-26 DIAGNOSIS — H5213 Myopia, bilateral: Secondary | ICD-10-CM | POA: Diagnosis not present

## 2017-06-26 DIAGNOSIS — H52223 Regular astigmatism, bilateral: Secondary | ICD-10-CM | POA: Diagnosis not present

## 2017-06-29 NOTE — Progress Notes (Signed)
Cardiology Office Note:    Date:  07/05/2017   ID:  Joe Davis, DOB 06/01/1946, MRN [id number]  PCP:  Joe Post, MD  Cardiologist:  [name] >> [name]   Electrophysiologist:  n/a  No chief complaint on file.   History of Present Illness:     Joe Davis is a 71 y.o. male with a hx of diastolic dysfunction, remote DVT and pulmonary embolism, HL, atypical chest pain. He is status Davis right THR at [facility] in 2/16.   Seen by PA  10/26/15 for complaints of shortness of breath. His long time companion, Joe Davis is a Therapist, sports in anesthesia at [facility] [employer].  She noted a gradually worsening of DOE over the past few years.  BNP and TSH was normal. 11/04/15  Stress Myoview was low risk and negative for ischemia with normal EF and EF was also normal by echo 09/26/14 . He did have a hypertensive blood pressure response with exercise. He complained of snoring and there was witnessed apneic episodes.   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     Since I last saw him he had positive sleep study but is not wearing CPAP Has urinary frequency and does not like taking mask on/off all the time  Needs new primary wanted labs and CXR significant other sees Dr [name]     Past Medical History:  Diagnosis Date  . Arthritis    "knees, hips" (10/10/2014)  . Basal cell carcinoma   . BPH (benign prostatic hyperplasia)   . Bradycardia vasovagal response 10 yrs ago   asymptomatic  . Diastolic dysfunction    Per echo in 2008  . DVT (deep venous thrombosis) ([place]) 1979   RLE  . Erectile dysfunction   . History of gout   . History of nuclear stress test    ETT-Myoview 3/17: Hypertensive blood pressure response to exercise, normal perfusion, EF 60%, low risk study  . Hypercholesterolemia    ? statin intolerant. May tolerate Lipitor  . Normal nuclear stress test May 2011  . Obesity   . Pulmonary embolism ([place]) 1979  . Syncope and collapse 8-10 yrs ago    Past Surgical History:    Procedure Laterality Date  . CARDIOVASCULAR STRESS TEST  May 2011   EF 73%; No ischemia  . COLONOSCOPY  2013  . JOINT REPLACEMENT    . KNEE ARTHROSCOPY Right 10/03/2012   Procedure: RIGHT KNEE ARTHROSCOPY WITH DEBRIDEMENT;  Surgeon: [name], MD;  Location: [facility];  Service: Orthopedics;  Laterality: Right;  WITH DEBRIDEMENT  . MOHS SURGERY  6 yrs    nose  . TOTAL HIP ARTHROPLASTY Right 10/06/2014    Current Medications: Outpatient Medications Prior to Visit  Medication Sig Dispense Refill  . aspirin 81 MG tablet Take 162 mg by mouth every morning.     Marland Kitchen atorvastatin (LIPITOR) 10 MG tablet TAKE 1 TABLET(10 MG) BY MOUTH DAILY 90 tablet 1  . fish oil-omega-3 fatty acids 1000 MG capsule Take 1 capsule by mouth daily as needed (when going out to eat).     . hydrochlorothiazide (MICROZIDE) 12.5 MG capsule TAKE 1 CAPSULE(12.5 MG) BY MOUTH DAILY 90 capsule 2  . Plant Sterol Stanol-Pantethine (CHOLESTOFF COMPLETE PO) Take 1 tablet by mouth 2 (two) times daily.     . tadalafil (CIALIS) 10 MG tablet Take 10 mg by mouth daily as needed for erectile dysfunction.    . timolol (TIMOPTIC) 0.5 % ophthalmic solution PLACE 1  GTT IN OU BID     No facility-administered medications prior to visit.      Allergies:   Crestor [rosuvastatin calcium]   Social History   Socioeconomic History  . Marital status: Divorced    Spouse name: None  . Number of children: None  . Years of education: None  . Highest education level: None  Social Needs  . Financial resource strain: None  . Food insecurity - worry: None  . Food insecurity - inability: None  . Transportation needs - medical: None  . Transportation needs - non-medical: None  Occupational History  . None  Tobacco Use  . Smoking status: Former Smoker    Packs/day: 1.00    Years: 10.00    Pack years: 10.00    Types: Cigarettes    Last attempt to quit: 08/15/1977    Years since quitting: 39.9  . Smokeless tobacco: Never Used  Substance  and Sexual Activity  . Alcohol use: Yes    Alcohol/week: 4.2 oz    Types: 7 Shots of liquor per week  . Drug use: No  . Sexual activity: Yes  Other Topics Concern  . None  Social History Narrative  . None     Family History:  The patient's He was adopted. Family history is unknown by patient.   ROS:   Please see the history of present illness.    Review of Systems  Cardiovascular: Positive for dyspnea on exertion.  Respiratory: Positive for snoring.    All other systems reviewed and are negative.   Physical Exam:    VS:  BP 126/76   Pulse (!) 55   Ht 5' 10.5" (1.791 m)   Wt 237 lb (107.5 kg)   SpO2 98%   BMI 33.53 kg/m    Affect appropriate Obese white male   HEENT: normal Neck supple with no adenopathy JVP normal no bruits no thyromegaly Lungs clear with no wheezing and good diaphragmatic motion Heart:  S1/S2 no murmur, no rub, gallop or click PMI normal Abdomen: benighn, BS positve, no tenderness, no AAA no bruit.  No HSM or HJR Distal pulses intact with no bruits No edema Neuro non-focal Skin warm and dry No muscular weakness Plus 2 RLE edema plus one with varicose veins LLE   Wt Readings from Last 3 Encounters:  07/05/17 237 lb (107.5 kg)  11/23/16 231 lb (104.8 kg)  2016/10/23 231 lb (104.8 kg)      Studies/Labs Reviewed:     EKG:  10-23-16 SR rate 54 LAD otherwise normal   Recent Labs: 10/23/16: ALT 24; NT-Pro BNP 62   Recent Lipid Panel    Component Value Date/Time   CHOL 137 10-23-2016 0933   TRIG 62 October 23, 2016 0933   HDL 44 Oct 23, 2016 0933   CHOLHDL 3.1 10-23-2016 0933   CHOLHDL 3.4 12/15/2015 0802   VLDL 17 12/15/2015 0802   LDLCALC 81 Oct 23, 2016 0933   LDLDIRECT 125.7 09/25/2012 0914    Additional studies/ records that were reviewed today include:   Myoview 11/04/15 Low risk stress nuclear study with normal perfusion and normal left ventricular regional and global systolic function.  EF 60%. Low Risk. Hypertensive response to  exercise (211/106)  Echo 09/26/14 EF 55-65%, Gr 1 DD   ASSESSMENT:     Dyspnea   PLAN:     In order of problems listed above:  1. Shortness of breath -  11/04/15  BNP and nuclear stress test normal. EF normal   CXR ordered today  3. Snoring - OSA CPAP non comopliant  f/u Dr. Radford Pax   4. Edema:  Dependant with previous DVT LLE and varicosities  Elevate legs Low sodium diet compression stockings HCTZ  Suggested [facility] For varicosities   5. Chol:  Repeat lab work today  Lab Results  Component Value Date   [facility] 81 10/13/2016   Yearly lab work done  He will try to f/u with Dr [name] as his primary as Dr Elease Hashimoto retired   Jenkins Rouge

## 2017-07-05 ENCOUNTER — Encounter: Payer: Self-pay | Admitting: Cardiovascular Disease

## 2017-07-05 ENCOUNTER — Ambulatory Visit (INDEPENDENT_AMBULATORY_CARE_PROVIDER_SITE_OTHER): Payer: Medicare Other | Admitting: Cardiovascular Disease

## 2017-07-05 VITALS — BP 126/76 | HR 55 | Ht 70.5 in | Wt 237.0 lb

## 2017-07-05 DIAGNOSIS — E7800 Pure hypercholesterolemia, unspecified: Secondary | ICD-10-CM | POA: Diagnosis not present

## 2017-07-05 DIAGNOSIS — R0600 Dyspnea, unspecified: Secondary | ICD-10-CM | POA: Diagnosis not present

## 2017-07-05 LAB — COMPREHENSIVE METABOLIC PANEL
ALK PHOS: 72 IU/L (ref 39–117)
ALT: 20 IU/L (ref 0–44)
AST: 27 IU/L (ref 0–40)
Albumin/Globulin Ratio: 2.1 (ref 1.2–2.2)
Albumin: 4.5 g/dL (ref 3.5–4.8)
BUN/Creatinine Ratio: 21 (ref 10–24)
BUN: 18 mg/dL (ref 8–27)
Bilirubin Total: 0.6 mg/dL (ref 0.0–1.2)
CALCIUM: 9 mg/dL (ref 8.6–10.2)
CO2: 24 mmol/L (ref 20–29)
CREATININE: 0.87 mg/dL (ref 0.76–1.27)
Chloride: 104 mmol/L (ref 96–106)
GFR calc Af Amer: 99 mL/min/{1.73_m2} (ref >59)
GFR, EST NON AFRICAN AMERICAN: 86 mL/min/{1.73_m2} (ref >59)
GLOBULIN, TOTAL: 2.1 g/dL (ref 1.5–4.5)
GLUCOSE: 101 mg/dL — AB (ref 65–99)
Potassium: 4.8 mmol/L (ref 3.5–5.2)
SODIUM: 142 mmol/L (ref 134–144)
Total Protein: 6.6 g/dL (ref 6.0–8.5)

## 2017-07-05 LAB — LIPID PANEL
CHOL/HDL RATIO: 3.1 ratio (ref 0.0–5.0)
CHOLESTEROL TOTAL: 144 mg/dL (ref 100–199)
HDL: 46 mg/dL (ref >39)
LDL CALC: 81 mg/dL (ref 0–99)
TRIGLYCERIDES: 83 mg/dL (ref 0–149)
VLDL Cholesterol Cal: 17 mg/dL (ref 5–40)

## 2017-07-05 NOTE — Patient Instructions (Signed)
Medication Instructions:  Your physician recommends that you continue on your current medications as directed. Please refer to the Current Medication list given to you today.   Labwork: TODAY CMP, LIPID   Testing/Procedures: A chest x-ray takes a picture of the organs and structures inside the chest, including the heart, lungs, and blood vessels. This test can show several things, including, whether the heart is enlarges; whether fluid is building up in the lungs; and whether pacemaker / defibrillator leads are still in place. THIS IS TO BE DONE TODAY AT [facility]   Follow-Up: Your physician wants you to follow-up in: [place] DR. Blima Singer will receive a reminder letter in the mail two months in advance. If you don't receive a letter, please call our office to schedule the follow-up appointment.   Any Other Special Instructions Will Be Listed Below (If Applicable).     If you need a refill on your cardiac medications before your next appointment, please call your pharmacy.

## 2017-07-10 ENCOUNTER — Telehealth: Payer: Self-pay | Admitting: Family Medicine

## 2017-07-10 NOTE — Telephone Encounter (Signed)
Copied from [place] [phone]. Topic: General - Other >> Jul 10, 2017  8:46 AM Yvette Rack wrote: Reason for CRM: patient states that Dr Elease Hashimoto had just called them and would like for hi to call them back

## 2017-07-10 NOTE — Telephone Encounter (Unsigned)
Copied from [place] [phone]. Topic: General - Other >> Jul 10, 2017  8:46 AM Yvette Rack wrote: Reason for CRM: patient states that Dr Elease Hashimoto had just called them and would like for hi to call them back

## 2017-07-12 ENCOUNTER — Ambulatory Visit (INDEPENDENT_AMBULATORY_CARE_PROVIDER_SITE_OTHER): Payer: Medicare Other | Admitting: Family Medicine

## 2017-07-12 ENCOUNTER — Encounter: Payer: Self-pay | Admitting: Family Medicine

## 2017-07-12 VITALS — BP 120/80 | HR 60 | Temp 98.3°F | Wt 230.0 lb

## 2017-07-12 DIAGNOSIS — Z23 Encounter for immunization: Secondary | ICD-10-CM | POA: Diagnosis not present

## 2017-07-12 DIAGNOSIS — M1000 Idiopathic gout, unspecified site: Secondary | ICD-10-CM

## 2017-07-12 MED ORDER — INDOMETHACIN 50 MG PO CAPS: 50.0000 mg | ORAL_CAPSULE | Freq: Three times a day (TID) | ORAL | 0 refills | Status: DC | PRN

## 2017-07-12 NOTE — Progress Notes (Signed)
Subjective:     Patient ID: Joe Davis, male   DOB: 1946/02/13, 71 y.o.   MRN: [id number]  HPI Patient here for acute visit with concerns for recurrent gout right metatarsophalangeal joint. Had similar involvement of the same joint in the past about a year ago. Moderate pain. Onset about 4 days ago. He has previously taken indomethacin which worked well for him. He is adopted so family history unknown.  Ate a lot of [place] and more ETOH last week than usual.    Patient does take HCTZ for some peripheral edema issues and he stopped this about 3 or 4 days ago  Past Medical History:  Diagnosis Date  . Arthritis    "knees, hips" (10/10/2014)  . Basal cell carcinoma   . BPH (benign prostatic hyperplasia)   . Bradycardia vasovagal response 10 yrs ago   asymptomatic  . Diastolic dysfunction    Per echo in 2008  . DVT (deep venous thrombosis) ([place]) 1979   RLE  . Erectile dysfunction   . History of gout   . History of nuclear stress test    ETT-Myoview 3/17: Hypertensive blood pressure response to exercise, normal perfusion, EF 60%, low risk study  . Hypercholesterolemia    ? statin intolerant. May tolerate Lipitor  . Normal nuclear stress test May 2011  . Obesity   . Pulmonary embolism ([place]) 1979  . Syncope and collapse 8-10 yrs ago   Past Surgical History:  Procedure Laterality Date  . CARDIOVASCULAR STRESS TEST  May 2011   EF 73%; No ischemia  . COLONOSCOPY  2013  . JOINT REPLACEMENT    . KNEE ARTHROSCOPY Right 10/03/2012   Procedure: RIGHT KNEE ARTHROSCOPY WITH DEBRIDEMENT;  Surgeon: [name], MD;  Location: [facility];  Service: Orthopedics;  Laterality: Right;  WITH DEBRIDEMENT  . MOHS SURGERY  6 yrs    nose  . TOTAL HIP ARTHROPLASTY Right 10/06/2014    reports that he quit smoking about 39 years ago. His smoking use included cigarettes. He has a 10.00 pack-year smoking history. he has never used smokeless tobacco. He reports that he drinks about 4.2 oz of alcohol per week.  He reports that he does not use drugs. He was adopted. Family history is unknown by patient. No Active Allergies   Review of Systems  Constitutional: Negative for chills and fever.  Musculoskeletal: Positive for arthralgias.       Objective:   Physical Exam  Constitutional: He appears well-developed and well-nourished.  Cardiovascular: Normal rate.  Pulmonary/Chest: Effort normal and breath sounds normal. No respiratory distress. He has no wheezes. He has no rales.  Musculoskeletal:  Patient has some mild swelling, mild erythema, warmth, and tenderness right metatarsophalangeal joint       Assessment:     Acute gout involving right metatarsophalangeal joint    Plan:     -Indomethacin 50 mg 3 times a day when necessary for 5-6 days.  Take with food. -Touch base if pain not resolving in about 5 days -We mentioned that his HCTZ could be potentially exacerbating gout and look at potential alternatives such as losartan if further blood pressure control needed -Reviewed potential dietary triggers for gout  [name] MD [facility]

## 2017-07-12 NOTE — Patient Instructions (Signed)

## 2017-09-27 DIAGNOSIS — L821 Other seborrheic keratosis: Secondary | ICD-10-CM | POA: Diagnosis not present

## 2017-09-27 DIAGNOSIS — D229 Melanocytic nevi, unspecified: Secondary | ICD-10-CM | POA: Diagnosis not present

## 2017-10-07 IMAGING — NM NM MISC PROCEDURE
6 series · 36 of 36 positions shown · non-contrast
Comparison: none

[Series 1: wbr_s-proj_st stress-gsp · 6.40mm/px · 6 of 512 frames shown]
[frame 43/512]
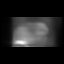
[frame 128/512]
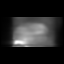
[frame 214/512]
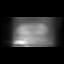
[frame 299/512]
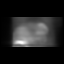
[frame 384/512]
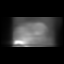
[frame 470/512]
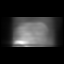

[Series 1: stress-sum-em · 6.40mm/px · 6 of 64 frames shown]
[frame 6/64]
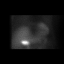
[frame 16/64]
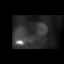
[frame 27/64]
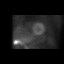
[frame 38/64]
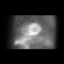
[frame 48/64]
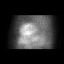
[frame 59/64]
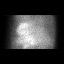

[Series 1: wbr_r-proj_st rest · 6.40mm/px · 6 of 64 frames shown]
[frame 6/64]
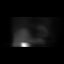
[frame 16/64]
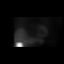
[frame 27/64]
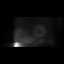
[frame 38/64]
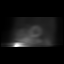
[frame 48/64]
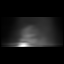
[frame 59/64]
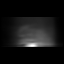

[Series 1: wbr_s-proj_st stress-sum-em · 6.40mm/px · 6 of 64 frames shown]
[frame 6/64]
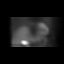
[frame 16/64]
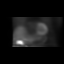
[frame 27/64]
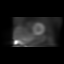
[frame 38/64]
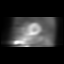
[frame 48/64]
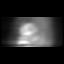
[frame 59/64]
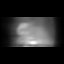

[Series 1: stress-gsp · 6.40mm/px · 6 of 511 frames shown]
[frame 43/511]
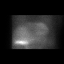
[frame 128/511]
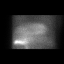
[frame 213/511]
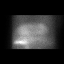
[frame 298/511]
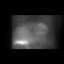
[frame 383/511]
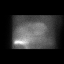
[frame 469/511]
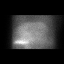

[Series 1: rest · 6.40mm/px · 6 of 64 frames shown]
[frame 6/64]
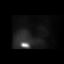
[frame 16/64]
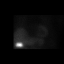
[frame 27/64]
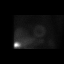
[frame 38/64]
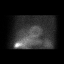
[frame 48/64]
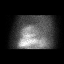
[frame 59/64]
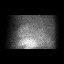

[36 of 36 positions shown; findings below may reference images not displayed]

Canned report from images found in remote index.

Refer to host system for actual result text.

## 2017-10-11 DIAGNOSIS — L578 Other skin changes due to chronic exposure to nonionizing radiation: Secondary | ICD-10-CM | POA: Diagnosis not present

## 2017-10-11 DIAGNOSIS — Z1283 Encounter for screening for malignant neoplasm of skin: Secondary | ICD-10-CM | POA: Diagnosis not present

## 2017-10-11 DIAGNOSIS — D1801 Hemangioma of skin and subcutaneous tissue: Secondary | ICD-10-CM | POA: Diagnosis not present

## 2017-10-11 DIAGNOSIS — Z85828 Personal history of other malignant neoplasm of skin: Secondary | ICD-10-CM | POA: Diagnosis not present

## 2017-10-11 DIAGNOSIS — D2260 Melanocytic nevi of unspecified upper limb, including shoulder: Secondary | ICD-10-CM | POA: Diagnosis not present

## 2017-10-11 DIAGNOSIS — D225 Melanocytic nevi of trunk: Secondary | ICD-10-CM | POA: Diagnosis not present

## 2017-10-11 DIAGNOSIS — D2270 Melanocytic nevi of unspecified lower limb, including hip: Secondary | ICD-10-CM | POA: Diagnosis not present

## 2017-10-11 DIAGNOSIS — L821 Other seborrheic keratosis: Secondary | ICD-10-CM | POA: Diagnosis not present

## 2017-10-11 DIAGNOSIS — L570 Actinic keratosis: Secondary | ICD-10-CM | POA: Diagnosis not present

## 2017-10-11 DIAGNOSIS — L814 Other melanin hyperpigmentation: Secondary | ICD-10-CM | POA: Diagnosis not present

## 2017-10-19 DIAGNOSIS — H25813 Combined forms of age-related cataract, bilateral: Secondary | ICD-10-CM | POA: Diagnosis not present

## 2017-10-19 DIAGNOSIS — H52223 Regular astigmatism, bilateral: Secondary | ICD-10-CM | POA: Diagnosis not present

## 2017-10-19 DIAGNOSIS — H5213 Myopia, bilateral: Secondary | ICD-10-CM | POA: Diagnosis not present

## 2017-11-08 DIAGNOSIS — N5201 Erectile dysfunction due to arterial insufficiency: Secondary | ICD-10-CM | POA: Diagnosis not present

## 2017-11-08 DIAGNOSIS — Z125 Encounter for screening for malignant neoplasm of prostate: Secondary | ICD-10-CM | POA: Diagnosis not present

## 2017-11-08 DIAGNOSIS — R350 Frequency of micturition: Secondary | ICD-10-CM | POA: Diagnosis not present

## 2017-11-08 DIAGNOSIS — N401 Enlarged prostate with lower urinary tract symptoms: Secondary | ICD-10-CM | POA: Diagnosis not present

## 2017-12-04 ENCOUNTER — Other Ambulatory Visit: Payer: Self-pay

## 2017-12-04 ENCOUNTER — Encounter: Payer: Self-pay | Admitting: Family Medicine

## 2017-12-04 ENCOUNTER — Ambulatory Visit: Payer: Self-pay | Admitting: *Deleted

## 2017-12-04 ENCOUNTER — Ambulatory Visit (INDEPENDENT_AMBULATORY_CARE_PROVIDER_SITE_OTHER): Payer: Medicare Other | Admitting: Family Medicine

## 2017-12-04 VITALS — BP 121/79 | HR 56 | Temp 98.2°F | Resp 16 | Ht 70.5 in | Wt 230.0 lb

## 2017-12-04 DIAGNOSIS — M1000 Idiopathic gout, unspecified site: Secondary | ICD-10-CM

## 2017-12-04 DIAGNOSIS — M79672 Pain in left foot: Secondary | ICD-10-CM

## 2017-12-04 MED ORDER — PREDNISONE 20 MG PO TABS: ORAL_TABLET | ORAL | 0 refills | Status: DC

## 2017-12-04 NOTE — Telephone Encounter (Signed)
Pt has a symptoms of pain and swelling of l foot x 1 week.Denies any injury or fever.Has a history of gout .Appointment made for today with Dr Jarold Song.  Reason for Disposition . [1] Swollen foot AND [2] no fever  (Exceptions: localized bump from bunions, calluses, insect bite, sting)  Answer Assessment - Initial Assessment Questions 1. ONSET: "When did the pain start?"       1  Week    2. LOCATION: "Where is the pain located?"        L foot   3. PAIN: "How bad is the pain?"    (Scale 1-10; or mild, moderate, severe)   -  MILD (1-3): doesn't interfere with normal activities    -  MODERATE (4-7): interferes with normal activities (e.g., work or school) or awakens from sleep, limping    -  SEVERE (8-10): excruciating pain, unable to do any normal activities, unable to walk     7 4. WORK OR EXERCISE: "Has there been any recent work or exercise that involved this part of the body?"         No 5. CAUSE: "What do you think is causing the foot pain?"       History  Red  Spot smaller  Than  Dime   6. OTHER SYMPTOMS: "Do you have any other symptoms?" (e.g., leg pain, rash, fever, numbness)      l foot  Swollen  Moderate   Size   7. PREGNANCY: "Is there any chance you are pregnant?" "When was your last menstrual period?"    N/a  Protocols used: FOOT PAIN-A-AH

## 2017-12-04 NOTE — Progress Notes (Signed)
Subjective:     Patient ID: Joe Davis, male   DOB: 03-24-1946, 72 y.o.   MRN: [id number]  HPI Patient seen with left foot pain. He suspects this is gout related. He was splinting some wood about a week ago and states that he does recall one piece of wood hitting his foot but does not recall any bruising and is not sure that his pain started exactly then. His current pain is centered mostly around the metatarsophalangeal joint. He has noticed some warmth and slight redness. Took some leftover Indocin without much improvement. Ambulating without much difficulty. No leg or calf pain. He's had remote history of DVT  Past Medical History:  Diagnosis Date  . Arthritis    "knees, hips" (10/10/2014)  . Basal cell carcinoma   . BPH (benign prostatic hyperplasia)   . Bradycardia vasovagal response 10 yrs ago   asymptomatic  . Diastolic dysfunction    Per echo in 2008  . DVT (deep venous thrombosis) ([place]) 1979   RLE  . Erectile dysfunction   . History of gout   . History of nuclear stress test    ETT-Myoview 3/17: Hypertensive blood pressure response to exercise, normal perfusion, EF 60%, low risk study  . Hypercholesterolemia    ? statin intolerant. May tolerate Lipitor  . Normal nuclear stress test May 2011  . Obesity   . Pulmonary embolism ([place]) 1979  . Syncope and collapse 8-10 yrs ago   Past Surgical History:  Procedure Laterality Date  . CARDIOVASCULAR STRESS TEST  May 2011   EF 73%; No ischemia  . COLONOSCOPY  2013  . JOINT REPLACEMENT    . KNEE ARTHROSCOPY Right 10/03/2012   Procedure: RIGHT KNEE ARTHROSCOPY WITH DEBRIDEMENT;  Surgeon: [name], MD;  Location: [facility];  Service: Orthopedics;  Laterality: Right;  WITH DEBRIDEMENT  . MOHS SURGERY  6 yrs    nose  . TOTAL HIP ARTHROPLASTY Right 10/06/2014    reports that he quit smoking about 40 years ago. His smoking use included cigarettes. He has a 10.00 pack-year smoking history. He has never used smokeless tobacco. He  reports that he drinks about 4.2 oz of alcohol per week. He reports that he does not use drugs. He was adopted. Family history is unknown by patient. No Active Allergies   Review of Systems  Constitutional: Negative for chills and fever.  Respiratory: Negative for shortness of breath.   Cardiovascular: Negative for chest pain.       Objective:   Physical Exam  Constitutional: He appears well-developed and well-nourished.  Cardiovascular: Normal rate.  Pulmonary/Chest: Effort normal and breath sounds normal. No respiratory distress. He has no wheezes. He has no rales.  Musculoskeletal:  Left foot is edematous compared to the right. Slightly warm to touch. Minimal erythema metatarsophalangeal joint and tender over that joint. No metatarsal tenderness. Good capillary refill. He has good dorsalis pedis pulses bilaterally. No ecchymosis       Assessment:     Acute left foot pain. Suspect acute gout. Doubt related injury    Plan:     -Start prednisone 40 mg daily for 5 days -Touch base if foot pain not greatly improved over the next 24-48 hours with prednisone. If not improving on prednisone obtain x-rays-though doubt related to recent injury -We discussed pros and cons of prophylactic treatment for gout. He will consider-once acute attack has subsided  [name] MD [facility]

## 2017-12-04 NOTE — Patient Instructions (Signed)
Touch base by Wednesday if foot not better.

## 2017-12-15 ENCOUNTER — Telehealth: Payer: Self-pay | Admitting: Family Medicine

## 2017-12-15 MED ORDER — PREDNISONE 20 MG PO TABS: ORAL_TABLET | ORAL | 0 refills | Status: DC

## 2017-12-15 NOTE — Telephone Encounter (Signed)
Copied from [place]. Topic: Quick Communication - See Telephone Encounter >> Dec 15, 2017  8:23 AM Conception Chancy, NT wrote: CRM for notification. See Telephone encounter for: 12/15/17.  Patient is calling and states he has had a reoccurrence of gout and would like a refill on predniSONE (DELTASONE) 20 MG tablet. He states he leaves town this afternoon and would like it before then.  [facility] [place], [place] - [address] [place] [address] [place] [address] Phone: [phone] Fax: [phone]

## 2017-12-15 NOTE — Telephone Encounter (Signed)
Refill sent.

## 2017-12-15 NOTE — Telephone Encounter (Signed)
Refill once OK. 

## 2018-01-05 ENCOUNTER — Ambulatory Visit: Payer: Medicare Other | Admitting: Family Medicine

## 2018-01-05 ENCOUNTER — Encounter: Payer: Self-pay | Admitting: Family Medicine

## 2018-01-05 ENCOUNTER — Ambulatory Visit (INDEPENDENT_AMBULATORY_CARE_PROVIDER_SITE_OTHER): Payer: Medicare Other | Admitting: Family Medicine

## 2018-01-05 VITALS — BP 120/80 | HR 70 | Temp 98.3°F | Wt 230.0 lb

## 2018-01-05 DIAGNOSIS — M1000 Idiopathic gout, unspecified site: Secondary | ICD-10-CM

## 2018-01-05 MED ORDER — COLCHICINE 0.6 MG PO TABS: 0.6000 mg | ORAL_TABLET | Freq: Every day | ORAL | 3 refills | Status: DC

## 2018-01-05 MED ORDER — ALLOPURINOL 100 MG PO TABS: ORAL_TABLET | ORAL | 6 refills | Status: DC

## 2018-01-05 NOTE — Progress Notes (Signed)
  Subjective:     Patient ID: Joe Davis, male   DOB: 1946/06/18, 72 y.o.   MRN: [id number]  HPI Patient is here with another gout flare up. He's had about 3 now in the first 5 months of this year. Current episode involves left great toe. We called in some more prednisone is on day 2 or 3 and already improving some. No recent injury. We discussed possible prophylaxis previously. He has noted in the past things like [place] and shrimp cause flareups.  He denies being on prophylactic medications previously. No thiazide use.  Past Medical History:  Diagnosis Date  . Arthritis    "knees, hips" (10/10/2014)  . Basal cell carcinoma   . BPH (benign prostatic hyperplasia)   . Bradycardia vasovagal response 10 yrs ago   asymptomatic  . Diastolic dysfunction    Per echo in 2008  . DVT (deep venous thrombosis) ([place]) 1979   RLE  . Erectile dysfunction   . History of gout   . History of nuclear stress test    ETT-Myoview 3/17: Hypertensive blood pressure response to exercise, normal perfusion, EF 60%, low risk study  . Hypercholesterolemia    ? statin intolerant. May tolerate Lipitor  . Normal nuclear stress test May 2011  . Obesity   . Pulmonary embolism ([place]) 1979  . Syncope and collapse 8-10 yrs ago   Past Surgical History:  Procedure Laterality Date  . CARDIOVASCULAR STRESS TEST  May 2011   EF 73%; No ischemia  . COLONOSCOPY  2013  . JOINT REPLACEMENT    . KNEE ARTHROSCOPY Right 10/03/2012   Procedure: RIGHT KNEE ARTHROSCOPY WITH DEBRIDEMENT;  Surgeon: [name], MD;  Location: [facility];  Service: Orthopedics;  Laterality: Right;  WITH DEBRIDEMENT  . MOHS SURGERY  6 yrs    nose  . TOTAL HIP ARTHROPLASTY Right 10/06/2014    reports that he quit smoking about 40 years ago. His smoking use included cigarettes. He has a 10.00 pack-year smoking history. He has never used smokeless tobacco. He reports that he drinks about 4.2 oz of alcohol per week. He reports that he does not use  drugs. He was adopted. Family history is unknown by patient. No Active Allergies   Review of Systems  Constitutional: Negative for chills and fever.  Musculoskeletal: Positive for arthralgias.       Objective:   Physical Exam  Constitutional: He appears well-developed and well-nourished.  Cardiovascular: Normal rate and regular rhythm.  Pulmonary/Chest: Effort normal and breath sounds normal. He has no wheezes. He has no rales.  Musculoskeletal:  has some mild erythema mild tenderness and warmth left great toe       Assessment:     Acute gout with more frequent flareups over the past several months    Plan:     -We recommend he consider prophylaxis. He will finish out current prednisone and then about 2 weeks after he is finished start Alllopurionol 100 mg daily for 2 weeks then increase to 200 mg daily for 2 weeks and increase to 300 mg daily -Start  colcrys 0.6 mg 1 daily during buildup of allopurinol -Reassess here in about 2 months and recheck uric acid level then  [name] MD [facility]

## 2018-01-05 NOTE — Patient Instructions (Addendum)
Finish out the current prednisone  In about 2 weeks AFTER current gout attack stable start Allopurinol and build up as per instructions  Go ahead and start the Colcys one daily  We will see back in 2 months and will check Uric acid level then.

## 2018-03-07 ENCOUNTER — Encounter: Payer: Self-pay | Admitting: Family Medicine

## 2018-03-07 ENCOUNTER — Ambulatory Visit (INDEPENDENT_AMBULATORY_CARE_PROVIDER_SITE_OTHER): Payer: Medicare Other | Admitting: Family Medicine

## 2018-03-07 ENCOUNTER — Ambulatory Visit (INDEPENDENT_AMBULATORY_CARE_PROVIDER_SITE_OTHER): Payer: Medicare Other

## 2018-03-07 VITALS — BP 130/80 | HR 51 | Temp 98.3°F | Wt 230.0 lb

## 2018-03-07 DIAGNOSIS — R0609 Other forms of dyspnea: Secondary | ICD-10-CM

## 2018-03-07 DIAGNOSIS — M1000 Idiopathic gout, unspecified site: Secondary | ICD-10-CM

## 2018-03-07 DIAGNOSIS — R0600 Dyspnea, unspecified: Secondary | ICD-10-CM | POA: Diagnosis not present

## 2018-03-07 NOTE — Progress Notes (Signed)
Subjective:     Patient ID: Joe Davis, male   DOB: Jul 24, 1946, 72 y.o.   MRN: [id number]  HPI Patient here to discuss the following issues  History of gout. We had prescribed allopurinol as he had had several flareups in the past year. He was instructed not to start this until his recent acute flare subsided. He ended up not starting allopurinol. No further flareups since May. He has decided at this point not to take prophylaxis.  Patient requesting chest x-ray. He's had a couple of years of some progressive dyspnea on exertion. Had seen cardiologist for this last year. They recommended chest x-ray but he never went. Denies any chronic cough. Quit smoking 1979. Approximate 10 year history. No associated chest pain. No orthopnea. No peripheral edema issues. He realizes this may be deconditioning. Echocardiogram 2016 ejection fraction 55-60%  Patient had nuclear stress test March 2017 no ischemia  Past Medical History:  Diagnosis Date  . Arthritis    "knees, hips" (10/10/2014)  . Basal cell carcinoma   . BPH (benign prostatic hyperplasia)   . Bradycardia vasovagal response 10 yrs ago   asymptomatic  . Diastolic dysfunction    Per echo in 2008  . DVT (deep venous thrombosis) ([place]) 1979   RLE  . Erectile dysfunction   . History of gout   . History of nuclear stress test    ETT-Myoview 3/17: Hypertensive blood pressure response to exercise, normal perfusion, EF 60%, low risk study  . Hypercholesterolemia    ? statin intolerant. May tolerate Lipitor  . Normal nuclear stress test May 2011  . Obesity   . Pulmonary embolism ([place]) 1979  . Syncope and collapse 8-10 yrs ago   Past Surgical History:  Procedure Laterality Date  . CARDIOVASCULAR STRESS TEST  May 2011   EF 73%; No ischemia  . COLONOSCOPY  2013  . JOINT REPLACEMENT    . KNEE ARTHROSCOPY Right 10/03/2012   Procedure: RIGHT KNEE ARTHROSCOPY WITH DEBRIDEMENT;  Surgeon: [name], MD;  Location: [facility];  Service:  Orthopedics;  Laterality: Right;  WITH DEBRIDEMENT  . MOHS SURGERY  6 yrs    nose  . TOTAL HIP ARTHROPLASTY Right 10/06/2014    reports that he quit smoking about 40 years ago. His smoking use included cigarettes. He has a 10.00 pack-year smoking history. He has never used smokeless tobacco. He reports that he drinks about 4.2 oz of alcohol per week. He reports that he does not use drugs. He was adopted. Family history is unknown by patient. No Active Allergies  Review of Systems  Constitutional: Negative for fatigue.  Eyes: Negative for visual disturbance.  Respiratory: Positive for shortness of breath. Negative for cough, chest tightness and wheezing.   Cardiovascular: Negative for chest pain, palpitations and leg swelling.  Neurological: Negative for dizziness, syncope, weakness, light-headedness and headaches.       Objective:   Physical Exam  Constitutional: He is oriented to person, place, and time. He appears well-developed and well-nourished.  HENT:  Right Ear: External ear normal.  Left Ear: External ear normal.  Mouth/Throat: Oropharynx is clear and moist.  Eyes: Pupils are equal, round, and reactive to light.  Neck: Neck supple. No thyromegaly present.  Cardiovascular: Normal rate and regular rhythm.  Pulmonary/Chest: Effort normal and breath sounds normal. No respiratory distress. He has no wheezes. He has no rales.  Musculoskeletal: He exhibits no edema.  Neurological: He is alert and oriented to person, place, and time.  Assessment:     #1 gout. Currently stable. Patient has decided against allopurinol for prophylaxis  #2 chronic dyspnea on exertion which is unchanged. No associated chest pain    Plan:     -Discussed pros and cons of prophylaxis for gout.  Pt has decided against prophylaxis at this time. -Obtain chest x-ray -Continue close follow-up with cardiology -Consider pulmonary function testing if chest x-ray unremarkable and especially for any  progressive dyspnea  [name] MD [facility]

## 2018-03-07 NOTE — Patient Instructions (Signed)
Shortness of Breath, Adult Shortness of breath is when a person has trouble breathing enough air, or when a person feels like she or he is having trouble breathing in enough air. Shortness of breath could be a sign of medical problem. Follow these instructions at home: Pay attention to any changes in your symptoms. Take these actions to help with your condition:  Do not smoke. Smoking is a common cause of shortness of breath. If you smoke and you need help quitting, ask your health care provider.  Avoid things that can irritate your airways, such as: ? Mold. ? Dust. ? Air pollution. ? Chemical fumes. ? Things that can cause allergy symptoms (allergens), if you have allergies.  Keep your living space clean and free of mold and dust.  Rest as needed. Slowly return to your usual activities.  Take over-the-counter and prescription medicines, including oxygen and inhaled medicines, only as told by your health care provider.  Keep all follow-up visits as told by your health care provider. This is important.  Contact a health care provider if:  Your condition does not improve as soon as expected.  You have a hard time doing your normal activities, even after you rest.  You have new symptoms. Get help right away if:  Your shortness of breath gets worse.  You have shortness of breath when you are resting.  You feel light-headed or you faint.  You have a cough that is not controlled with medicines.  You cough up blood.  You have pain with breathing.  You have pain in your chest, arms, shoulders, or abdomen.  You have a fever.  You cannot walk up stairs or exercise the way that you normally do. This information is not intended to replace advice given to you by your health care provider. Make sure you discuss any questions you have with your health care provider. Document Released: 04/26/2001 Document Revised: 02/20/2016 Document Reviewed: 01/07/2016 Elsevier Interactive Patient  Education  2018 Elsevier Inc.  

## 2018-03-29 ENCOUNTER — Other Ambulatory Visit: Payer: Self-pay | Admitting: Cardiovascular Disease

## 2018-03-29 DIAGNOSIS — E7800 Pure hypercholesterolemia, unspecified: Secondary | ICD-10-CM

## 2018-05-07 ENCOUNTER — Telehealth: Payer: Self-pay | Admitting: Family Medicine

## 2018-05-07 MED ORDER — COLCHICINE 0.6 MG PO TABS: 0.6000 mg | ORAL_TABLET | Freq: Every day | ORAL | 1 refills | Status: DC

## 2018-05-07 NOTE — Telephone Encounter (Signed)
May refill Colchicine.

## 2018-05-07 NOTE — Telephone Encounter (Signed)
Copied from [place] [id number]. Topic: Quick Communication - See Telephone Encounter >> May 07, 2018  1:08 PM Conception Chancy, NT wrote: CRM for notification. See Telephone encounter for: 05/07/18.  Patient family is calling and states they never knew the patient was put on colchicine 0.6 MG tablet and since it was called in in May they are needing the office to resend that to [facility].  [facility] [place], [place] - [address] OF [address] [place] [address] Phone: [phone] Fax: [phone]

## 2018-05-07 NOTE — Addendum Note (Signed)
Addended by: Westley Hummer B on: 05/07/2018 05:23 PM   Modules accepted: Orders

## 2018-05-07 NOTE — Telephone Encounter (Signed)
Office visit 7/24 -Discussed pros and cons of prophylaxis for gout.  Pt has decided against prophylaxis at this time.  Okay to refill?

## 2018-05-31 DIAGNOSIS — H401322 Pigmentary glaucoma, left eye, moderate stage: Secondary | ICD-10-CM | POA: Diagnosis not present

## 2018-05-31 DIAGNOSIS — H2513 Age-related nuclear cataract, bilateral: Secondary | ICD-10-CM | POA: Diagnosis not present

## 2018-05-31 DIAGNOSIS — H401311 Pigmentary glaucoma, right eye, mild stage: Secondary | ICD-10-CM | POA: Diagnosis not present

## 2018-06-19 ENCOUNTER — Other Ambulatory Visit: Payer: Self-pay | Admitting: Family Medicine

## 2018-06-19 MED ORDER — ALLOPURINOL 100 MG PO TABS: ORAL_TABLET | ORAL | 6 refills | Status: DC

## 2018-06-19 NOTE — Telephone Encounter (Signed)
Requested medication (s) are due for refill today: yes  Requested medication (s) are on the active medication list: yes  Last refill:  12/2017 with 6 refills  Future visit scheduled: No  Notes to clinic:  Unable to fill per protocol due to no recent uric acid level    Requested Prescriptions  Pending Prescriptions Disp Refills   allopurinol (ZYLOPRIM) 100 MG tablet 90 tablet 6    Sig: Take one tablet daily for two weeks, then increase to two daily for two weeks and then increase to three daily.     Endocrinology:  Gout Agents Failed - 06/19/2018 12:07 PM      Failed - Uric Acid in normal range and within 360 days    No results found for: POCURA, LABURIC       Passed - Cr in normal range and within 360 days    Creat  Date Value Ref Range Status  04/21/2016 0.77 0.70 - 1.18 mg/dL Final    Comment:      For patients > or = 74 years of age: The upper reference limit for Creatinine is approximately 13% higher for people identified as African-American.      Creatinine, Ser  Date Value Ref Range Status  07/05/2017 0.87 0.76 - 1.27 mg/dL Final         Passed - Valid encounter within last 12 months    Recent Outpatient Visits          3 months ago Dyspnea on exertion   [facility] [place] [facility], MD   5 months ago Idiopathic gout, unspecified chronicity, unspecified site   [facility], Alinda Sierras, MD   6 months ago Left foot pain   [facility], Alinda Sierras, MD   11 months ago Idiopathic gout, unspecified chronicity, unspecified site   [facility], Alinda Sierras, MD   1 year ago Acute gout involving toe of right foot, unspecified cause   [facility] at [facility], Alinda Sierras, MD      Future Appointments            In 1 month [name], Wallis Bamberg, MD [place], [facility]

## 2018-06-19 NOTE — Telephone Encounter (Signed)
Copied from [place] [id number]. Topic: Quick Communication - Rx Refill/Question >> Jun 19, 2018 11:54 AM Nils Flack wrote: Medication: allopurinol (ZYLOPRIM) 100 MG tablet  Has the patient contacted their pharmacy? Yes.   (Agent: If no, request that the patient contact the pharmacy for the refill.) (Agent: If yes, when and what did the pharmacy advise?)  Preferred Pharmacy (with phone number or street name): [facility] [place]  Agent: Please be advised that RX refills may take up to 3 business days. We ask that you follow-up with your pharmacy.

## 2018-06-21 ENCOUNTER — Ambulatory Visit (INDEPENDENT_AMBULATORY_CARE_PROVIDER_SITE_OTHER): Payer: Medicare Other

## 2018-06-21 ENCOUNTER — Telehealth: Payer: Self-pay | Admitting: Family Medicine

## 2018-06-21 DIAGNOSIS — Z23 Encounter for immunization: Secondary | ICD-10-CM

## 2018-06-21 NOTE — Telephone Encounter (Signed)
Pt would like to have a Rx for PRN when the first sign of gout flares.  Pt state that he does not want to take something daily for the rest of his life for gout and wanted to see if Dr. Elease Hashimoto can do him that favor.  Pharm:  [facility] in [place] ([place])

## 2018-06-22 NOTE — Telephone Encounter (Signed)
My suggestion is to use Colchicine 0.6 mg - take two at onset of acute gout attack and then one po bid until acute flare subsides.

## 2018-06-22 NOTE — Telephone Encounter (Signed)
Left message on machine for patient to return our call CRM 

## 2018-06-25 ENCOUNTER — Ambulatory Visit: Payer: Medicare Other | Admitting: Family Medicine

## 2018-06-25 MED ORDER — COLCHICINE 0.6 MG PO TABS: ORAL_TABLET | ORAL | 1 refills | Status: DC

## 2018-06-25 NOTE — Telephone Encounter (Signed)
Patient is aware and Rx sent 

## 2018-07-05 DIAGNOSIS — H401311 Pigmentary glaucoma, right eye, mild stage: Secondary | ICD-10-CM | POA: Diagnosis not present

## 2018-07-05 DIAGNOSIS — H2513 Age-related nuclear cataract, bilateral: Secondary | ICD-10-CM | POA: Diagnosis not present

## 2018-07-05 DIAGNOSIS — H401322 Pigmentary glaucoma, left eye, moderate stage: Secondary | ICD-10-CM | POA: Diagnosis not present

## 2018-07-20 NOTE — Progress Notes (Signed)
Cardiology Office Note:    Date:  07/23/2018   ID:  Joe Davis, DOB 27-Jun-1946, MRN [id number]  PCP:  [name], MD  Cardiologist:  [name] >> [name]   Electrophysiologist:  n/a  No chief complaint on file.   History of Present Illness:      72 y.o. obese male with OSA on CPAP, remote DVT and PE. Post THR [facility] 2016. Dyspnea not related to cardiac issues in  Past only ? Diastolic dysfunction. BNP has been normal in past. 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  Last TTE 2016 EF 55-65% only grade one diastolic dysfunction Having bouts Of gout that he uses colchicine for   Enjoys duck hunting has had right hip replaced at [facility] and may need left done soon     Past Medical History:  Diagnosis Date  . Arthritis    "knees, hips" (10/10/2014)  . Basal cell carcinoma   . BPH (benign prostatic hyperplasia)   . Bradycardia vasovagal response 10 yrs ago   asymptomatic  . Diastolic dysfunction    Per echo in 2008  . DVT (deep venous thrombosis) ([place]) 1979   RLE  . Erectile dysfunction   . History of gout   . History of nuclear stress test    ETT-Myoview 3/17: Hypertensive blood pressure response to exercise, normal perfusion, EF 60%, low risk study  . Hypercholesterolemia    ? statin intolerant. May tolerate Lipitor  . Normal nuclear stress test May 2011  . Obesity   . Pulmonary embolism ([place]) 1979  . Syncope and collapse 8-10 yrs ago    Past Surgical History:  Procedure Laterality Date  . CARDIOVASCULAR STRESS TEST  May 2011   EF 73%; No ischemia  . COLONOSCOPY  2013  . JOINT REPLACEMENT    . KNEE ARTHROSCOPY Right 10/03/2012   Procedure: RIGHT KNEE ARTHROSCOPY WITH DEBRIDEMENT;  Surgeon: [name], MD;  Location: [facility];  Service: Orthopedics;  Laterality: Right;  WITH DEBRIDEMENT  . MOHS SURGERY  6 yrs    nose  . TOTAL HIP ARTHROPLASTY Right 10/06/2014    Current  Medications: Outpatient Medications Prior to Visit  Medication Sig Dispense Refill  . allopurinol (ZYLOPRIM) 100 MG tablet Take one tablet daily for two weeks, then increase to two daily for two weeks and then increase to three daily. 90 tablet 6  . aspirin 81 MG tablet Take 162 mg by mouth every morning.     Marland Kitchen atorvastatin (LIPITOR) 10 MG tablet Take 1 tablet (10 mg total) by mouth daily. Please call and schedule an appointment for further refills 1st attempt 90 tablet 0  . colchicine 0.6 MG tablet take two at onset of acute gout attack and then one po bid until acute flare subsides. 90 tablet 1  . dorzolamide-timolol (COSOPT) 22.3-6.8 MG/ML ophthalmic solution Apply 1 drop to eye as directed.    . fish oil-omega-3 fatty acids 1000 MG capsule Take 1 capsule by mouth daily as needed (when going out to eat).     . tadalafil (CIALIS) 5 MG tablet TK 1  T PO D UTD  10  . timolol (TIMOPTIC) 0.5 % ophthalmic solution PLACE 1 GTT IN OU BID     No facility-administered medications prior to visit.      Allergies:   Patient has no active allergies.   Social History   Socioeconomic History  .  Marital status: Divorced    Spouse name: Not on file  . Number of children: Not on file  . Years of education: Not on file  . Highest education level: Not on file  Occupational History  . Not on file  Social Needs  . Financial resource strain: Not on file  . Food insecurity:    Worry: Not on file    Inability: Not on file  . Transportation needs:    Medical: Not on file    Non-medical: Not on file  Tobacco Use  . Smoking status: Former Smoker    Packs/day: 1.00    Years: 10.00    Pack years: 10.00    Types: Cigarettes    Last attempt to quit: 08/15/1977    Years since quitting: 40.9  . Smokeless tobacco: Never Used  Substance and Sexual Activity  . Alcohol use: Yes    Alcohol/week: 7.0 standard drinks    Types: 7 Shots of liquor per week  . Drug use: No  . Sexual activity: Yes  Lifestyle  .  Physical activity:    Days per week: Not on file    Minutes per session: Not on file  . Stress: Not on file  Relationships  . Social connections:    Talks on phone: Not on file    Gets together: Not on file    Attends religious service: Not on file    Active member of club or organization: Not on file    Attends meetings of clubs or organizations: Not on file    Relationship status: Not on file  Other Topics Concern  . Not on file  Social History Narrative  . Not on file     Family History:  The patient's He was adopted. Family history is unknown by patient.   ROS:   Please see the history of present illness.    Review of Systems  Cardiovascular: Positive for dyspnea on exertion.  Respiratory: Positive for snoring.    All other systems reviewed and are negative.   Physical Exam:    VS:  BP 134/84   Pulse 60   Ht 5' 10.5" (1.791 m)   Wt 230 lb (104.3 kg)   SpO2 96%   BMI 32.54 kg/m     Obese white male   Plus 2 RLE edema plus one with varicose veins LLE  Affect appropriate Healthy:  appears stated age HEENT: normal Neck supple with no adenopathy JVP normal no bruits no thyromegaly Lungs clear with no wheezing and good diaphragmatic motion Heart:  S1/S2 no murmur, no rub, gallop or click PMI normal Abdomen: benighn, BS positve, no tenderness, no AAA no bruit.  No HSM or HJR Distal pulses intact with no bruits Neuro non-focal No muscular weakness   Wt Readings from Last 3 Encounters:  07/23/18 230 lb (104.3 kg)  03/07/18 230 lb (104.3 kg)  01/05/18 230 lb (104.3 kg)      Studies/Labs Reviewed:     EKG:  11/02/2016 SR rate 54 LAD otherwise normal  07/23/18 SR rate 57 LAD no changes   Recent Labs: No results found for requested labs within last 8760 hours.   Recent Lipid Panel    Component Value Date/Time   CHOL 144 07/05/2017 1040   TRIG 83 07/05/2017 1040   HDL 46 07/05/2017 1040   CHOLHDL 3.1 07/05/2017 1040   CHOLHDL 3.4 12/15/2015 0802   VLDL  17 12/15/2015 0802   LDLCALC 81 07/05/2017 1040   LDLDIRECT 125.7  09/25/2012 0914    Additional studies/ records that were reviewed today include:   Myoview 11/04/15 Low risk stress nuclear study with normal perfusion and normal left ventricular regional and global systolic function.  EF 60%. Low Risk. Hypertensive response to exercise (211/106)  Echo 09/26/14 EF 55-65%, Gr 1 DD   ASSESSMENT:     Dyspnea   PLAN:     In order of problems listed above:  1. Shortness of breath -  11/04/15  BNP and nuclear stress test normal. EF normal   Quit smoking in 1979  Normal CXR July 2019   3. Snoring - OSA CPAP non comopliant  f/u Dr. Radford Pax   4. Edema:  Dependant with previous DVT LLE and varicosities  Elevate legs Low sodium diet compression stockings HCTZ  Suggested [facility] For varicosities   5. Chol:  LDL 81 on 07/05/17 repeat labs ordered  Will adjust lipitor dose accordingly  6. Gout:  Does not want allopurinol daily uses colchicine for flairs f/u [place]   7. Orhto:  Post right THR clear to have left one done if needed Sees [facility]

## 2018-07-23 ENCOUNTER — Encounter: Payer: Self-pay | Admitting: Cardiovascular Disease

## 2018-07-23 ENCOUNTER — Ambulatory Visit (INDEPENDENT_AMBULATORY_CARE_PROVIDER_SITE_OTHER): Payer: Medicare Other | Admitting: Cardiovascular Disease

## 2018-07-23 VITALS — BP 134/84 | HR 60 | Ht 70.5 in | Wt 230.0 lb

## 2018-07-23 DIAGNOSIS — R0600 Dyspnea, unspecified: Secondary | ICD-10-CM

## 2018-07-23 DIAGNOSIS — E7800 Pure hypercholesterolemia, unspecified: Secondary | ICD-10-CM | POA: Diagnosis not present

## 2018-07-23 DIAGNOSIS — E7849 Other hyperlipidemia: Secondary | ICD-10-CM | POA: Diagnosis not present

## 2018-07-23 LAB — LIPID PANEL
CHOL/HDL RATIO: 3.6 ratio (ref 0.0–5.0)
Cholesterol, Total: 166 mg/dL (ref 100–199)
HDL: 46 mg/dL (ref >39)
LDL Calculated: 97 mg/dL (ref 0–99)
Triglycerides: 113 mg/dL (ref 0–149)
VLDL CHOLESTEROL CAL: 23 mg/dL (ref 5–40)

## 2018-07-23 LAB — HEPATIC FUNCTION PANEL
ALT: 22 IU/L (ref 0–44)
AST: 25 IU/L (ref 0–40)
Albumin: 4.5 g/dL (ref 3.5–4.8)
Alkaline Phosphatase: 65 IU/L (ref 39–117)
Bilirubin Total: 0.4 mg/dL (ref 0.0–1.2)
Bilirubin, Direct: 0.13 mg/dL (ref 0.00–0.40)
TOTAL PROTEIN: 6.7 g/dL (ref 6.0–8.5)

## 2018-07-23 NOTE — Patient Instructions (Signed)
Medication Instructions:   If you need a refill on your cardiac medications before your next appointment, please call your pharmacy.   Lab work: Lipid and hepatic panel today If you have labs (blood work) drawn today and your tests are completely normal, you will receive your results only by: Marland Kitchen MyChart Message (if you have MyChart) OR . A paper copy in the mail If you have any lab test that is abnormal or we need to change your treatment, we will call you to review the results.  Testing/Procedures: none  Follow-Up: At [facility], you and your health needs are our priority.  As part of our continuing mission to provide you with exceptional heart care, we have created designated Provider Care Teams.  These Care Teams include your primary Cardiologist (physician) and Advanced Practice Providers (APPs -  Physician Assistants and Nurse Practitioners) who all work together to provide you with the care you need, when you need it. You will need a follow up appointment in 12 months.  Please call our office 2 months in advance to schedule this appointment.  You may see No primary care provider on file. or one of the following Advanced Practice Providers on your designated Care Team:   Truitt Merle, NP Cecilie Kicks, NP . Kathyrn Drown, NP  Any Other Special Instructions Will Be Listed Below (If Applicable).

## 2018-07-24 NOTE — Addendum Note (Signed)
Addended by: Joaquim Lai on: 07/24/2018 09:52 AM   Modules accepted: Orders

## 2018-09-17 DIAGNOSIS — H2513 Age-related nuclear cataract, bilateral: Secondary | ICD-10-CM | POA: Diagnosis not present

## 2018-09-17 DIAGNOSIS — H52223 Regular astigmatism, bilateral: Secondary | ICD-10-CM | POA: Diagnosis not present

## 2018-09-17 DIAGNOSIS — H524 Presbyopia: Secondary | ICD-10-CM | POA: Diagnosis not present

## 2018-09-17 DIAGNOSIS — H5213 Myopia, bilateral: Secondary | ICD-10-CM | POA: Diagnosis not present

## 2018-10-24 DIAGNOSIS — Z125 Encounter for screening for malignant neoplasm of prostate: Secondary | ICD-10-CM | POA: Diagnosis not present

## 2018-10-26 ENCOUNTER — Other Ambulatory Visit: Payer: Self-pay

## 2018-10-26 ENCOUNTER — Ambulatory Visit (INDEPENDENT_AMBULATORY_CARE_PROVIDER_SITE_OTHER): Payer: Medicare Other | Admitting: Family Medicine

## 2018-10-26 ENCOUNTER — Encounter: Payer: Self-pay | Admitting: Family Medicine

## 2018-10-26 VITALS — BP 132/82 | HR 58 | Temp 98.3°F | Ht 70.5 in | Wt 225.0 lb

## 2018-10-26 DIAGNOSIS — J069 Acute upper respiratory infection, unspecified: Secondary | ICD-10-CM

## 2018-10-26 NOTE — Progress Notes (Signed)
  Subjective:     Patient ID: Joe Davis, male   DOB: 06/18/1946, 73 y.o.   MRN: [id number]  HPI Patient seen for upper respiratory illness.  Started about 5 days ago.  Actually feels much better today.  Started off with some mild sore throat.  He has some dry cough.  He felt like he may have some wheezing initially but that seemed to improve with coughing.  No recent travels.  No documented fever.  No dyspnea.  No body aches.  No nausea, vomiting, or diarrhea.  Sore throat symptoms improving.  No sick contacts  Past Medical History:  Diagnosis Date  . Arthritis    "knees, hips" (10/10/2014)  . Basal cell carcinoma   . BPH (benign prostatic hyperplasia)   . Bradycardia vasovagal response 10 yrs ago   asymptomatic  . Diastolic dysfunction    Per echo in 2008  . DVT (deep venous thrombosis) ([name]) 1979   RLE  . Erectile dysfunction   . History of gout   . History of nuclear stress test    ETT-Myoview 3/17: Hypertensive blood pressure response to exercise, normal perfusion, EF 60%, low risk study  . Hypercholesterolemia    ? statin intolerant. May tolerate Lipitor  . Normal nuclear stress test May 2011  . Obesity   . Pulmonary embolism ([name]) 1979  . Syncope and collapse 8-10 yrs ago   Past Surgical History:  Procedure Laterality Date  . CARDIOVASCULAR STRESS TEST  May 2011   EF 73%; No ischemia  . COLONOSCOPY  2013  . JOINT REPLACEMENT    . KNEE ARTHROSCOPY Right 10/03/2012   Procedure: RIGHT KNEE ARTHROSCOPY WITH DEBRIDEMENT;  Surgeon: [name], MD;  Location: [facility];  Service: Orthopedics;  Laterality: Right;  WITH DEBRIDEMENT  . MOHS SURGERY  6 yrs    nose  . TOTAL HIP ARTHROPLASTY Right 10/06/2014    reports that he quit smoking about 41 years ago. His smoking use included cigarettes. He has a 10.00 pack-year smoking history. He has never used smokeless tobacco. He reports current alcohol use of about 7.0 standard drinks of alcohol per week. He reports that he does not  use drugs. He was adopted. Family history is unknown by patient. No Active Allergies   Review of Systems  Constitutional: Negative for chills and fever.  HENT: Positive for congestion.   Respiratory: Positive for cough. Negative for shortness of breath.   Cardiovascular: Negative for chest pain.       Objective:   Physical Exam Constitutional:      Appearance: Normal appearance.  HENT:     Mouth/Throat:     Pharynx: Oropharynx is clear. No oropharyngeal exudate.  Neck:     Musculoskeletal: Neck supple.  Cardiovascular:     Rate and Rhythm: Normal rate and regular rhythm.  Pulmonary:     Effort: Pulmonary effort is normal.     Breath sounds: Normal breath sounds. No wheezing or rales.  Neurological:     Mental Status: He is alert.        Assessment:     Acute respiratory illness with cough.  Patient has no fever, dyspnea, recent travel.  Suspect viral.  Nonfocal exam.  Symptomatically already improving    Plan:     -Continue conservative measures with plenty of fluids and rest -Follow-up for any fever, increased shortness of breath, or other concerns  [name] MD [facility]

## 2018-10-26 NOTE — Patient Instructions (Signed)
Cough, Adult  Coughing is a reflex that clears your throat and your airways. Coughing helps to heal and protect your lungs. It is normal to cough occasionally, but a cough that happens with other symptoms or lasts a long time may be a sign of a condition that needs treatment. A cough may last only 2-3 weeks (acute), or it may last longer than 8 weeks (chronic). What are the causes? Coughing is commonly caused by:  Breathing in substances that irritate your lungs.  A viral or bacterial respiratory infection.  Allergies.  Asthma.  Postnasal drip.  Smoking.  Acid backing up from the stomach into the esophagus (gastroesophageal reflux).  Certain medicines.  Chronic lung problems, including COPD (or rarely, lung cancer).  Other medical conditions such as heart failure. Follow these instructions at home: Pay attention to any changes in your symptoms. Take these actions to help with your discomfort:  Take medicines only as told by your health care provider. ? If you were prescribed an antibiotic medicine, take it as told by your health care provider. Do not stop taking the antibiotic even if you start to feel better. ? Talk with your health care provider before you take a cough suppressant medicine.  Drink enough fluid to keep your urine clear or pale yellow.  If the air is dry, use a cold steam vaporizer or humidifier in your bedroom or your home to help loosen secretions.  Avoid anything that causes you to cough at work or at home.  If your cough is worse at night, try sleeping in a semi-upright position.  Avoid cigarette smoke. If you smoke, quit smoking. If you need help quitting, ask your health care provider.  Avoid caffeine.  Avoid alcohol.  Rest as needed. Contact a health care provider if:  You have new symptoms.  You cough up pus.  Your cough does not get better after 2-3 weeks, or your cough gets worse.  You cannot control your cough with suppressant  medicines and you are losing sleep.  You develop pain that is getting worse or pain that is not controlled with pain medicines.  You have a fever.  You have unexplained weight loss.  You have night sweats. Get help right away if:  You cough up blood.  You have difficulty breathing.  Your heartbeat is very fast. This information is not intended to replace advice given to you by your health care provider. Make sure you discuss any questions you have with your health care provider. Document Released: 01/28/2011 Document Revised: 01/07/2016 Document Reviewed: 10/08/2014 Elsevier Interactive Patient Education  2019 Elsevier Inc.  

## 2018-11-01 ENCOUNTER — Telehealth: Payer: Self-pay | Admitting: Family Medicine

## 2018-11-01 NOTE — Telephone Encounter (Signed)
Copied from [place] [id number]. Topic: Quick Communication - See Telephone Encounter >> Nov 01, 2018 11:25 AM Sheran Luz wrote: CRM for notification. See Telephone encounter for: 11/01/18.  Patient's wife, [name], states that patient is not feeling better after OV on 3/13 and OTC remedies do not seem to be working. She inquired if patient was able to have antibiotic sent in without additional OV. Please advise.    [phone]

## 2018-11-01 NOTE — Telephone Encounter (Signed)
Please advise on abx request.

## 2018-11-02 ENCOUNTER — Other Ambulatory Visit: Payer: Self-pay

## 2018-11-02 MED ORDER — DOXYCYCLINE HYCLATE 100 MG PO TABS: 100.0000 mg | ORAL_TABLET | Freq: Two times a day (BID) | ORAL | 0 refills | Status: DC

## 2018-11-02 NOTE — Telephone Encounter (Signed)
Called patient and let him know that I have sent over his prescription to the [facility] in [place] for him. Patient verbalized an understanding.

## 2018-11-02 NOTE — Telephone Encounter (Signed)
Called patient and he stated that he has not had a fever and he stated that he has not had any SOB. He said he has chest congestion and cough has some clear to yellow mucous, no wheezing. Patient is wanting to stay home and states that he feels like it is more bronchial and sinus drainage down throat and in nose. Now it is a little worse than when he saw Dr. Elease Hashimoto on 10/26/18 and patient is asking for antibiotic to be called in.  Please advise.

## 2018-11-02 NOTE — Telephone Encounter (Signed)
Doxycycline 100 mg po bid for 10 days.

## 2018-11-02 NOTE — Telephone Encounter (Signed)
Author phoned pt, but no answer, unable to leave VM. Routed to [facility], Dr. Erick Blinks CMA, to follow up.

## 2018-11-02 NOTE — Telephone Encounter (Signed)
Make sure we inquire about fever or dyspnea.  If either of those, he should be screened here further for Covid-19 risk.  If not, bronchitis can last for several weeks.   If no fever this may take longer to run course.   antibiotics would not benefit if viral.

## 2019-01-04 DIAGNOSIS — M1612 Unilateral primary osteoarthritis, left hip: Secondary | ICD-10-CM | POA: Diagnosis not present

## 2019-01-04 DIAGNOSIS — Z09 Encounter for follow-up examination after completed treatment for conditions other than malignant neoplasm: Secondary | ICD-10-CM | POA: Diagnosis not present

## 2019-01-08 DIAGNOSIS — N401 Enlarged prostate with lower urinary tract symptoms: Secondary | ICD-10-CM | POA: Diagnosis not present

## 2019-01-08 DIAGNOSIS — N5201 Erectile dysfunction due to arterial insufficiency: Secondary | ICD-10-CM | POA: Diagnosis not present

## 2019-01-08 DIAGNOSIS — R3915 Urgency of urination: Secondary | ICD-10-CM | POA: Diagnosis not present

## 2019-01-18 DIAGNOSIS — M1612 Unilateral primary osteoarthritis, left hip: Secondary | ICD-10-CM | POA: Diagnosis not present

## 2019-03-14 ENCOUNTER — Other Ambulatory Visit: Payer: Self-pay

## 2019-04-30 ENCOUNTER — Telehealth: Payer: Self-pay

## 2019-04-30 NOTE — Telephone Encounter (Signed)
Pneumovax 23? Or Prevnar 13?  Copied from [place] [id number]. Topic: General - Other >> Apr 30, 2019 10:54 AM Yvette Rack wrote: Reason for CRM: Pt would like to know when he should get the pneumonia vaccine. Pt requests call back. Cb# [phone]

## 2019-05-01 NOTE — Telephone Encounter (Signed)
He needs a pneumovax when he gets his flu vaccine.

## 2019-05-03 NOTE — Telephone Encounter (Signed)
Called patient and LMOVM to return call  [place] for [facility] to Discuss results / PCP / recommendations / Schedule patient   Per Dr. Elease Hashimoto: He needs a pneumovax when he gets his flu vaccine.  CRM Created in case patient calls back.

## 2019-05-11 ENCOUNTER — Ambulatory Visit: Payer: Medicare Other

## 2019-05-13 DIAGNOSIS — H401131 Primary open-angle glaucoma, bilateral, mild stage: Secondary | ICD-10-CM | POA: Diagnosis not present

## 2019-06-04 ENCOUNTER — Ambulatory Visit (INDEPENDENT_AMBULATORY_CARE_PROVIDER_SITE_OTHER): Payer: Medicare Other | Admitting: *Deleted

## 2019-06-04 ENCOUNTER — Other Ambulatory Visit: Payer: Self-pay

## 2019-06-04 DIAGNOSIS — Z23 Encounter for immunization: Secondary | ICD-10-CM

## 2019-06-04 NOTE — Progress Notes (Signed)
Patient presents to office for High dose flu and Pneumovax vaccine. Both vaccines administered in each arm with no reaction.

## 2019-06-20 DIAGNOSIS — M1612 Unilateral primary osteoarthritis, left hip: Secondary | ICD-10-CM | POA: Diagnosis not present

## 2019-06-25 NOTE — Progress Notes (Deleted)
Cardiology Office Note:    Date:  06/25/2019   ID:  Joe Davis, DOB Jul 23, 1946, MRN [id number]  PCP:  [name], MD  Cardiologist:  [name] >> [name]   Electrophysiologist:  n/a  No chief complaint on file.   History of Present Illness:      73 y.o. obese male with OSA on CPAP, remote DVT and PE. Post THR [facility] 2016. Dyspnea not related to cardiac issues in  Past only ? Diastolic dysfunction. BNP has been normal in past. 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  Last TTE 2016 EF 55-65% only grade one diastolic dysfunction Having bouts Of gout that he uses colchicine for   Enjoys duck hunting has had right hip replaced at [facility] and may need left done soon  Had US guided left hip injection 01/18/19 Dr Garner Gavel at [facility] showed bone on bone On left side   ***  Past Medical History:  Diagnosis Date  . Arthritis    "knees, hips" (10/10/2014)  . Basal cell carcinoma   . BPH (benign prostatic hyperplasia)   . Bradycardia vasovagal response 10 yrs ago   asymptomatic  . Diastolic dysfunction    Per echo in 2008  . DVT (deep venous thrombosis) ([place]) 1979   RLE  . Erectile dysfunction   . History of gout   . History of nuclear stress test    ETT-Myoview 3/17: Hypertensive blood pressure response to exercise, normal perfusion, EF 60%, low risk study  . Hypercholesterolemia    ? statin intolerant. May tolerate Lipitor  . Normal nuclear stress test May 2011  . Obesity   . Pulmonary embolism ([facility]) 1979  . Syncope and collapse 8-10 yrs ago    Past Surgical History:  Procedure Laterality Date  . CARDIOVASCULAR STRESS TEST  May 2011   EF 73%; No ischemia  . COLONOSCOPY  2013  . JOINT REPLACEMENT    . KNEE ARTHROSCOPY Right 10/03/2012   Procedure: RIGHT KNEE ARTHROSCOPY WITH DEBRIDEMENT;  Surgeon: [name], MD;  Location: [facility]S;  Service: Orthopedics;  Laterality: Right;   WITH DEBRIDEMENT  . MOHS SURGERY  6 yrs    nose  . TOTAL HIP ARTHROPLASTY Right 10/06/2014    Current Medications: Outpatient Medications Prior to Visit  Medication Sig Dispense Refill  . allopurinol (ZYLOPRIM) 100 MG tablet Take one tablet daily for two weeks, then increase to two daily for two weeks and then increase to three daily. (Patient not taking: Reported on 10/26/2018) 90 tablet 6  . aspirin 81 MG tablet Take 162 mg by mouth every morning.     Marland Kitchen atorvastatin (LIPITOR) 10 MG tablet Take 1 tablet (10 mg total) by mouth daily. Please call and schedule an appointment for further refills 1st attempt 90 tablet 0  . colchicine 0.6 MG tablet take two at onset of acute gout attack and then one po bid until acute flare subsides. (Patient not taking: Reported on 10/26/2018) 90 tablet 1  . doxycycline (VIBRA-TABS) 100 MG tablet Take 1 tablet (100 mg total) by mouth 2 (two) times daily. 20 tablet 0  . fish oil-omega-3 fatty acids 1000 MG capsule Take 1 capsule by mouth daily as needed (when going out to eat).     . tadalafil (CIALIS) 5 MG tablet TK 1  T PO D UTD  10  . timolol (TIMOPTIC) 0.5 % ophthalmic solution PLACE  1 GTT IN OU BID     No facility-administered medications prior to visit.      Allergies:   Patient has no active allergies.   Social History   Socioeconomic History  . Marital status: Divorced    Spouse name: Not on file  . Number of children: Not on file  . Years of education: Not on file  . Highest education level: Not on file  Occupational History  . Not on file  Social Needs  . Financial resource strain: Not on file  . Food insecurity    Worry: Not on file    Inability: Not on file  . Transportation needs    Medical: Not on file    Non-medical: Not on file  Tobacco Use  . Smoking status: Former Smoker    Packs/day: 1.00    Years: 10.00    Pack years: 10.00    Types: Cigarettes    Quit date: 08/15/1977    Years since quitting: 41.8  . Smokeless tobacco:  Never Used  Substance and Sexual Activity  . Alcohol use: Yes    Alcohol/week: 7.0 standard drinks    Types: 7 Shots of liquor per week  . Drug use: No  . Sexual activity: Yes  Lifestyle  . Physical activity    Days per week: Not on file    Minutes per session: Not on file  . Stress: Not on file  Relationships  . Social Herbalist on phone: Not on file    Gets together: Not on file    Attends religious service: Not on file    Active member of club or organization: Not on file    Attends meetings of clubs or organizations: Not on file    Relationship status: Not on file  Other Topics Concern  . Not on file  Social History Narrative  . Not on file     Family History:  The patient's He was adopted. Family history is unknown by patient.   ROS:   Please see the history of present illness.    Review of Systems  Cardiovascular: Positive for dyspnea on exertion.  Respiratory: Positive for snoring.    All other systems reviewed and are negative.   Physical Exam:    VS:  There were no vitals taken for this visit.    Obese white male   Plus 2 RLE edema plus one with varicose veins LLE  Affect appropriate Healthy:  appears stated age 72: normal Neck supple with no adenopathy JVP normal no bruits no thyromegaly Lungs clear with no wheezing and good diaphragmatic motion Heart:  S1/S2 no murmur, no rub, gallop or click PMI normal Abdomen: benighn, BS positve, no tenderness, no AAA no bruit.  No HSM or HJR Distal pulses intact with no bruits Neuro non-focal Post right THR    Wt Readings from Last 3 Encounters:  10/26/18 225 lb (102.1 kg)  07/23/18 230 lb (104.3 kg)  03/07/18 230 lb (104.3 kg)      Studies/Labs Reviewed:     EKG:  2016-11-10 SR rate 54 LAD otherwise normal  07/23/18 SR rate 57 LAD no changes   Recent Labs: 07/23/2018: ALT 22   Recent Lipid Panel    Component Value Date/Time   CHOL 166 07/23/2018 0936   TRIG 113 07/23/2018 0936   HDL  46 07/23/2018 0936   CHOLHDL 3.6 07/23/2018 0936   CHOLHDL 3.4 12/15/2015 0802   VLDL 17 12/15/2015 0802  [place] 97 07/23/2018 0936   LDLDIRECT 125.7 09/25/2012 0914    Additional studies/ records that were reviewed today include:   Myoview 11/04/15 Low risk stress nuclear study with normal perfusion and normal left ventricular regional and global systolic function.  EF 60%. Low Risk. Hypertensive response to exercise (211/106)  Echo 09/26/14 EF 55-65%, Gr 1 DD   ASSESSMENT:     Dyspnea   PLAN:     In order of problems listed above:  1. Shortness of breath -  11/04/15  BNP and nuclear stress test normal. EF normal   Quit smoking in 1979  Normal CXR July 2019   3. Snoring - OSA CPAP non comopliant  f/u Dr. Radford Pax   4. Edema:  Dependant with previous DVT LLE and varicosities  Elevate legs Low sodium diet compression stockings HCTZ  Suggested [facility] For varicosities   5. Chol:  LDL 81 on 07/05/17 repeat labs ordered  Will adjust lipitor dose accordingly  6. Gout:  Does not want allopurinol daily uses colchicine for flairs f/u [place]   7. Orhto:  Post right THR clear to have left one done if needed Sees [employer]

## 2019-07-01 ENCOUNTER — Ambulatory Visit: Payer: Medicare Other | Admitting: Cardiovascular Disease

## 2019-07-02 ENCOUNTER — Telehealth: Payer: Self-pay | Admitting: *Deleted

## 2019-07-02 DIAGNOSIS — H2513 Age-related nuclear cataract, bilateral: Secondary | ICD-10-CM | POA: Diagnosis not present

## 2019-07-02 DIAGNOSIS — H401332 Pigmentary glaucoma, bilateral, moderate stage: Secondary | ICD-10-CM | POA: Diagnosis not present

## 2019-07-02 LAB — HM DIABETES EYE EXAM

## 2019-07-02 NOTE — Telephone Encounter (Signed)
Copied from [place] [id number]. Topic: General - Inquiry >> Jul 02, 2019  3:41 PM Alease Frame wrote: Reason for CRM: Patient wife called in to receive information about the covid vaccine shot and if it will be available at the office

## 2019-07-02 NOTE — Telephone Encounter (Signed)
Do you have any advise that I could give to patients? I am not sure how to address this or when they should check back? Please advise.

## 2019-07-03 NOTE — Telephone Encounter (Signed)
No vaccines approved and we obviously have no way to know if/when they will be.  I think this information (ie availability of vaccines locally) should be evident from the news/media.

## 2019-07-03 NOTE — Telephone Encounter (Signed)
Called patient and gave him the message and he thanked me and stated he will keep an eye on the news and check back if he hears anything.

## 2019-08-01 ENCOUNTER — Telehealth: Payer: Self-pay | Admitting: Family Medicine

## 2019-08-01 NOTE — Telephone Encounter (Signed)
Medication Refill - Medication: Prednisone   Pt states he was given Prednisone by Dr Elease Hashimoto and is taking his last pill today, but is still having pain (Gout) and would like a refill. Please advise.   Preferred Pharmacy:  [id number] - [place], [place] - [address] Phone:  [phone]  Fax:  [phone]       Pt was advised that RX refills may take up to 3 business days. We ask that you follow-up with your pharmacy.

## 2019-08-01 NOTE — Telephone Encounter (Signed)
Message Routed to PCP CMA 

## 2019-08-01 NOTE — Telephone Encounter (Signed)
Left detailed message that PCP is out of the office until tomorrow. Will contact patient once recommendations are given on 08/02/2019. Pt. advised to call office with any questions.

## 2019-08-01 NOTE — Telephone Encounter (Signed)
Patient called back in reference to the refill on Rx below Please advise say that he need it bad. Ph# [phone]

## 2019-08-05 NOTE — Telephone Encounter (Signed)
Please see message. °

## 2019-08-05 NOTE — Telephone Encounter (Signed)
Called patient and he stated that his wife put him on something that is helping him and patient refused an appointment because his wife did not want him to do a virtual or telephone appointment. I advised that patient will have to have an appointment in the future for further medication. Patient verbalized an understanding.

## 2019-08-05 NOTE — Telephone Encounter (Signed)
If still having a "flare up" needs follow up- at least by Doxy to further assess.  Not generally typical of gout not to resolve with prednisone

## 2019-08-28 ENCOUNTER — Ambulatory Visit: Payer: Medicare Other | Attending: Internal Medicine

## 2019-08-28 DIAGNOSIS — Z23 Encounter for immunization: Secondary | ICD-10-CM | POA: Insufficient documentation

## 2019-08-28 NOTE — Progress Notes (Signed)
   Covid-19 Vaccination Clinic  Name:  Joe Davis    MRN: [id number] DOB: March 09, 1946  08/28/2019  Joe Davis was observed post Covid-19 immunization for 15 minutes without incidence. He was provided with Vaccine Information Sheet and instruction to access the V-Safe system.   Joe Davis was instructed to call 911 with any severe reactions post vaccine: Marland Kitchen Difficulty breathing  . Swelling of your face and throat  . A fast heartbeat  . A bad rash all over your body  . Dizziness and weakness    Immunizations Administered    Name Date Dose VIS Date Route   Pfizer COVID-19 Vaccine 08/28/2019 10:13 AM 0.3 mL 07/26/2019 Intramuscular   Manufacturer: [employer]   Lot: [id number]   [place]: [id number]

## 2019-09-17 ENCOUNTER — Ambulatory Visit: Payer: Medicare Other | Attending: Internal Medicine

## 2019-09-17 DIAGNOSIS — Z23 Encounter for immunization: Secondary | ICD-10-CM

## 2019-09-17 NOTE — Progress Notes (Signed)
   Covid-19 Vaccination Clinic  Name:  Joe Davis    MRN: [id number] DOB: 04-May-1946  09/17/2019  [name] was observed post Covid-19 immunization for 15 minutes without incidence. He was provided with Vaccine Information Sheet and instruction to access the V-Safe system.   [name] was instructed to call 911 with any severe reactions post vaccine: Marland Kitchen Difficulty breathing  . Swelling of your face and throat  . A fast heartbeat  . A bad rash all over your body  . Dizziness and weakness    Immunizations Administered    Name Date Dose VIS Date Route   Pfizer COVID-19 Vaccine 09/17/2019  8:23 AM 0.3 mL 07/26/2019 Intramuscular   Manufacturer: [place]   Lot: [id number]   [place]: [id number]

## 2019-09-18 ENCOUNTER — Other Ambulatory Visit: Payer: Self-pay

## 2019-09-18 ENCOUNTER — Encounter (HOSPITAL_COMMUNITY): Payer: Self-pay

## 2019-09-18 ENCOUNTER — Emergency Department (HOSPITAL_COMMUNITY)
Admission: EM | Admit: 2019-09-18 | Discharge: 2019-09-18 | Disposition: A | Discharge status: Home / Self Care | Payer: Medicare Other | Attending: Emergency Medicine | Admitting: Emergency Medicine

## 2019-09-18 DIAGNOSIS — Z7982 Long term (current) use of aspirin: Secondary | ICD-10-CM | POA: Insufficient documentation

## 2019-09-18 DIAGNOSIS — Z87891 Personal history of nicotine dependence: Secondary | ICD-10-CM | POA: Insufficient documentation

## 2019-09-18 DIAGNOSIS — R4020 Unspecified coma: Secondary | ICD-10-CM | POA: Diagnosis not present

## 2019-09-18 DIAGNOSIS — Z79899 Other long term (current) drug therapy: Secondary | ICD-10-CM | POA: Insufficient documentation

## 2019-09-18 DIAGNOSIS — R410 Disorientation, unspecified: Secondary | ICD-10-CM | POA: Diagnosis not present

## 2019-09-18 DIAGNOSIS — T3390XA Superficial frostbite of unspecified sites, initial encounter: Secondary | ICD-10-CM | POA: Diagnosis not present

## 2019-09-18 DIAGNOSIS — R55 Syncope and collapse: Secondary | ICD-10-CM | POA: Insufficient documentation

## 2019-09-18 DIAGNOSIS — R0902 Hypoxemia: Secondary | ICD-10-CM | POA: Diagnosis not present

## 2019-09-18 LAB — BASIC METABOLIC PANEL
Anion gap: 12 (ref 5–15)
BUN: 18 mg/dL (ref 8–23)
CO2: 24 mmol/L (ref 22–32)
Calcium: 9.2 mg/dL (ref 8.9–10.3)
Chloride: 103 mmol/L (ref 98–111)
Creatinine, Ser: 0.81 mg/dL (ref 0.61–1.24)
GFR calc Af Amer: >60 mL/min (ref >60)
GFR calc non Af Amer: >60 mL/min (ref >60)
Glucose, Bld: 121 mg/dL — ABNORMAL HIGH (ref 70–99)
Potassium: 4.4 mmol/L (ref 3.5–5.1)
Sodium: 139 mmol/L (ref 135–145)

## 2019-09-18 LAB — CBC WITH DIFFERENTIAL/PLATELET
Abs Immature Granulocytes: 0.03 10*3/uL (ref 0.00–0.07)
Basophils Absolute: 0 10*3/uL (ref 0.0–0.1)
Basophils Relative: 0 %
Eosinophils Absolute: 0.1 10*3/uL (ref 0.0–0.5)
Eosinophils Relative: 1 %
HCT: 45.9 % (ref 39.0–52.0)
Hemoglobin: 15.5 g/dL (ref 13.0–17.0)
Immature Granulocytes: 0 %
Lymphocytes Relative: 11 %
Lymphs Abs: 1 10*3/uL (ref 0.7–4.0)
MCH: 30.8 pg (ref 26.0–34.0)
MCHC: 33.8 g/dL (ref 30.0–36.0)
MCV: 91.1 fL (ref 80.0–100.0)
Monocytes Absolute: 0.9 10*3/uL (ref 0.1–1.0)
Monocytes Relative: 10 %
Neutro Abs: 6.5 10*3/uL (ref 1.7–7.7)
Neutrophils Relative %: 78 %
Platelets: 180 10*3/uL (ref 150–400)
RBC: 5.04 MIL/uL (ref 4.22–5.81)
RDW: 13.3 % (ref 11.5–15.5)
WBC: 8.5 10*3/uL (ref 4.0–10.5)
nRBC: 0 % (ref 0.0–0.2)

## 2019-09-18 LAB — CBG MONITORING, ED: Glucose-Capillary: 112 mg/dL — ABNORMAL HIGH (ref 70–99)

## 2019-09-18 MED ORDER — SODIUM CHLORIDE 0.9 % IV BOLUS
500.0000 mL | Freq: Once | INTRAVENOUS | Status: AC
  Administered 2019-09-18: 21:00:00 500 mL via INTRAVENOUS

## 2019-09-18 NOTE — ED Triage Notes (Addendum)
Pt bib ems, coming from home. Pt was doing yard work today, afterward the pt was inside warming up and had a syncopal episode. Pt had a guided fall. Diaphoretic and LOC. Previous episode in 2016. Episode happened 1741. Pt A0x4  157/77 Hr 60 rr 16 98%ra

## 2019-09-18 NOTE — ED Provider Notes (Signed)
[facility] EMERGENCY DEPARTMENT Provider Note   CSN: [id number] Arrival date & time: 09/18/19  1926     History Chief Complaint  Patient presents with  . Loss of Consciousness    Joe Davis is a 74 y.o. male.  HPI   Pt presented to the ED for evaluation of a syncopal episode.  Pt had his 2nd covid vaccination yesterday.  He had been feeling fine.  Pt was outside doing yardwork, spreading lime.  His hands started to get very cold.  He felt like he was getting frostbite.  He went inside but remained very chilled. He ended up warming himself in front of his stove that was being used to [employer].  He leaned over to warm his face and felt he was going to pass out.  He called for help and was helped the ground.  LOC was brief.  He remembers ems arriving.  When they tried to stand him up he still felt lighteaded.  Now he feels fine.  No CP.  No head.  No abd pain.  No shortness of breath.  No numbness or weakness.  Past Medical History:  Diagnosis Date  . Arthritis    "knees, hips" (10/10/2014)  . Basal cell carcinoma   . BPH (benign prostatic hyperplasia)   . Bradycardia vasovagal response 10 yrs ago   asymptomatic  . Diastolic dysfunction    Per echo in 2008  . DVT (deep venous thrombosis) ([place]) 1979   RLE  . Erectile dysfunction   . History of gout   . History of nuclear stress test    ETT-Myoview 3/17: Hypertensive blood pressure response to exercise, normal perfusion, EF 60%, low risk study  . Hypercholesterolemia    ? statin intolerant. May tolerate Lipitor  . Normal nuclear stress test May 2011  . Obesity   . Pulmonary embolism ([place]) 1979  . Syncope and collapse 8-10 yrs ago    Patient Active Problem List   Diagnosis Date Noted  . Back pain 08/11/2015  . Low back pain 08/11/2015  . Left-sided low back pain with left-sided sciatica   . Prediabetes 07/28/2015  . BPH (benign prostatic hyperplasia) 01/22/2015  . History of hip surgery 10/05/14  10/11/2014  . Diastolic dysfunction [id number]  . Syncope and collapse 10/10/2014  . Faintness   . Gout 10/03/2013  . Acute medial meniscal tear 10/03/2012  . Palpitation 05/03/2011  . Atypical chest pain 05/03/2011  . Hypercholesterolemia 01/26/2011  . BACK PAIN 01/23/2007  . Pain in limb 01/23/2007  . SKIN CANCER, HX OF 01/23/2007  . Remote history of venous thrombosis and embolism 01/23/2007    Past Surgical History:  Procedure Laterality Date  . CARDIOVASCULAR STRESS TEST  May 2011   EF 73%; No ischemia  . COLONOSCOPY  2013  . JOINT REPLACEMENT    . KNEE ARTHROSCOPY Right 10/03/2012   Procedure: RIGHT KNEE ARTHROSCOPY WITH DEBRIDEMENT;  Surgeon: [name], MD;  Location: [facility];  Service: Orthopedics;  Laterality: Right;  WITH DEBRIDEMENT  . MOHS SURGERY  6 yrs    nose  . TOTAL HIP ARTHROPLASTY Right 10/06/2014       Family History  Adopted: Yes  Family history unknown: Yes    Social History   Tobacco Use  . Smoking status: Former Smoker    Packs/day: 1.00    Years: 10.00    Pack years: 10.00    Types: Cigarettes    Quit date: 08/15/1977  Years since quitting: 42.1  . Smokeless tobacco: Never Used  Substance Use Topics  . Alcohol use: Yes    Alcohol/week: 7.0 standard drinks    Types: 7 Shots of liquor per week  . Drug use: No    Home Medications Prior to Admission medications   Medication Sig Start Date End Date Taking? Authorizing Provider  allopurinol (ZYLOPRIM) 100 MG tablet Take one tablet daily for two weeks, then increase to two daily for two weeks and then increase to three daily. Patient not taking: Reported on 10/26/2018 06/19/18   [name], MD  aspirin 81 MG tablet Take 162 mg by mouth every morning.     [provider]  atorvastatin (LIPITOR) 10 MG tablet Take 1 tablet (10 mg total) by mouth daily. Please call and schedule an appointment for further refills 1st attempt 03/29/18   Josue Hector, MD  colchicine 0.6 MG  tablet take two at onset of acute gout attack and then one po bid until acute flare subsides. Patient not taking: Reported on 10/26/2018 06/25/18   [name], MD  doxycycline (VIBRA-TABS) 100 MG tablet Take 1 tablet (100 mg total) by mouth 2 (two) times daily. 11/02/18   Burchette, Alinda Sierras, MD  fish oil-omega-3 fatty acids 1000 MG capsule Take 1 capsule by mouth daily as needed (when going out to eat).     [provider]  tadalafil (CIALIS) 5 MG tablet TK 1  T PO D UTD 11/01/17   [provider]  timolol (TIMOPTIC) 0.5 % ophthalmic solution PLACE 1 GTT IN OU BID 05/15/17   [provider]    Allergies    Patient has no active allergies.  Review of Systems   Review of Systems  All other systems reviewed and are negative.   Physical Exam Updated Vital Signs BP (!) 154/82   Pulse 63   Temp 98.6 F (37 C) (Oral)   Resp 14   Ht 1.778 m (5\' 10" )   Wt 107.5 kg   SpO2 100%   BMI 34.01 kg/m   Physical Exam Vitals and nursing note reviewed.  Constitutional:      General: He is not in acute distress.    Appearance: He is well-developed.  HENT:     Head: Normocephalic and atraumatic.     Right Ear: External ear normal.     Left Ear: External ear normal.  Eyes:     General: No scleral icterus.       Right eye: No discharge.        Left eye: No discharge.     Conjunctiva/sclera: Conjunctivae normal.  Neck:     Trachea: No tracheal deviation.  Cardiovascular:     Rate and Rhythm: Normal rate and regular rhythm.  Pulmonary:     Effort: Pulmonary effort is normal. No respiratory distress.     Breath sounds: Normal breath sounds. No stridor. No wheezing or rales.  Abdominal:     General: Bowel sounds are normal. There is no distension.     Palpations: Abdomen is soft.     Tenderness: There is no abdominal tenderness. There is no guarding or rebound.  Musculoskeletal:        General: No tenderness.     Cervical back: Neck supple.  Skin:     General: Skin is warm and dry.     Findings: No rash.  Neurological:     General: No focal deficit present.     Mental Status:  He is alert.     Cranial Nerves: No cranial nerve deficit (no facial droop, extraocular movements intact, no slurred speech).     Sensory: No sensory deficit.     Motor: No abnormal muscle tone or seizure activity.     Coordination: Coordination normal.     Comments: 5/5 ue and le strength, no facial droop, nl speech      ED Results / Procedures / Treatments   Labs (all labs ordered are listed, but only abnormal results are displayed) Labs Reviewed  BASIC METABOLIC PANEL - Abnormal; Notable for the following components:      Result Value   Glucose, Bld 121 (*)    All other components within normal limits  CBG MONITORING, ED - Abnormal; Notable for the following components:   Glucose-Capillary 112 (*)    All other components within normal limits  CBC WITH DIFFERENTIAL/PLATELET    EKG EKG Interpretation  Date/Time:  Wednesday September 18 2019 20:30:22 EST Ventricular Rate:  60 PR Interval:    QRS Duration: 109 QT Interval:  427 QTC Calculation: 427 R Axis:   -40 Text Interpretation: Sinus rhythm Left anterior fascicular block Baseline wander in lead(s) V3 No significant change since last tracing Confirmed by Dorie Rank [phone]) on 09/18/2019 9:18:35 PM   Radiology No results found.  Procedures Procedures (including critical care time)  Medications Ordered in ED Medications  sodium chloride 0.9 % bolus 500 mL (0 mLs Intravenous Stopped 09/18/19 2210)    ED Course  I have reviewed the triage vital signs and the nursing notes.  Pertinent labs & imaging results that were available during my care of the patient were reviewed by me and considered in my medical decision making (see chart for details).  Clinical Course as of Sep 18 2239  Wed Sep 18, 2019  2239 Patient has remained asymptomatic.  Monitored in the ED for several hours.  Laboratory  tests are reassuring.   [JK]  2239 Orthostatic vital signs unremarkable.  Patient was able to stand without difficulty.   [JK]    Clinical Course User Index [JK] Dorie Rank, MD   MDM Rules/Calculators/A&P                      Patient presented to the ED for evaluation after a syncopal episode.  Patient did get his Covid vaccine yesterday.  His hands did get very chilled when he was outside working in the yard.  Some of that may have been related to the cold ambient temperatures but there could have been a component of reaction from the vaccine.  Patient's presentation is consistent with a vasovagal syncopal episode.  He is not showing any signs to suggest acute cardiac injury.  No focal neurologic symptoms to suggest TIA or stroke.  Patient has remained asymptomatic and his ED work-up is reassuring.  Patient appears stable for discharge.  Discussed outpatient follow-up with primary care doctor.  Warning signs precautions discussed.  Final Clinical Impression(s) / ED Diagnoses Final diagnoses:  Syncope and collapse     Dorie Rank, MD 09/18/19 2241

## 2019-09-18 NOTE — Discharge Instructions (Signed)
Follow up with your primary care doctor to be rechecked, return to the ED for recurrent symptoms, chest pain, fever

## 2019-09-23 ENCOUNTER — Telehealth: Payer: Self-pay | Admitting: Cardiovascular Disease

## 2019-09-23 NOTE — Telephone Encounter (Signed)
Pt c/o Syncope: STAT if syncope occurred within 30 minutes and pt complains of lightheadedness High Priority if episode of passing out, completely, today or in last 24 hours   1. Did you pass out today? no  2. When is the last time you passed out? 09/18/19  3. Has this occurred multiple times? Once in 2016 and once in 2008  4. Did you have any symptoms prior to passing out? Light headed, dizzy, very low HR  Patient's wife states the patient fainted and went to the hospital 2/3. She said she is concerned, because she does not know when it will happen again and that the patient has an extremely low heart rate. The patient has a virtual appointment 2/23.

## 2019-09-23 NOTE — Telephone Encounter (Signed)
Called patient back and made an earlier appointment for tomorrow.

## 2019-09-24 ENCOUNTER — Telehealth: Payer: Self-pay | Admitting: Cardiovascular Disease

## 2019-09-24 ENCOUNTER — Encounter: Payer: Self-pay | Admitting: Physician Assistant

## 2019-09-24 ENCOUNTER — Other Ambulatory Visit: Payer: Self-pay

## 2019-09-24 ENCOUNTER — Telehealth: Payer: Self-pay | Admitting: *Deleted

## 2019-09-24 ENCOUNTER — Ambulatory Visit (INDEPENDENT_AMBULATORY_CARE_PROVIDER_SITE_OTHER): Payer: Medicare Other | Admitting: Physician Assistant

## 2019-09-24 VITALS — BP 130/80 | HR 77 | Ht 70.0 in | Wt 240.0 lb

## 2019-09-24 DIAGNOSIS — Z01810 Encounter for preprocedural cardiovascular examination: Secondary | ICD-10-CM | POA: Diagnosis not present

## 2019-09-24 DIAGNOSIS — R55 Syncope and collapse: Secondary | ICD-10-CM

## 2019-09-24 DIAGNOSIS — H401332 Pigmentary glaucoma, bilateral, moderate stage: Secondary | ICD-10-CM | POA: Diagnosis not present

## 2019-09-24 DIAGNOSIS — E7849 Other hyperlipidemia: Secondary | ICD-10-CM

## 2019-09-24 DIAGNOSIS — Z86711 Personal history of pulmonary embolism: Secondary | ICD-10-CM | POA: Diagnosis not present

## 2019-09-24 DIAGNOSIS — H2513 Age-related nuclear cataract, bilateral: Secondary | ICD-10-CM | POA: Diagnosis not present

## 2019-09-24 NOTE — Patient Instructions (Signed)
Your physician recommends that you continue on your current medications as directed. Please refer to the Current Medication list given to you today.    Your physician has requested that you have an echocardiogram. Echocardiography is a painless test that uses sound waves to create images of your heart. It provides your doctor with information about the size and shape of your heart and how well your heart's chambers and valves are working. This procedure takes approximately one hour. There are no restrictions for this procedure.  IN THE NEXT 1-2 DAYS     Your physician has recommended that you wear an event monitor. Event monitors are medical devices that record the heart's electrical activity. Doctors most often [place] these monitors to diagnose arrhythmias. Arrhythmias are problems with the speed or rhythm of the heartbeat. The monitor is a small, portable device. You can wear one while you do your normal daily activities. This is usually used to diagnose what is causing palpitations/syncope (passing out).  [place]   Your physician recommends that you schedule a follow-up appointment in: [place] [name]

## 2019-09-24 NOTE — Telephone Encounter (Signed)
Patient enrolled for [employer] to ship a 30 day cardiac event monitor to his home.   

## 2019-09-24 NOTE — Telephone Encounter (Signed)
I spoke to the patient's wife who was wondering if the patient will be cleared for surgery on 2/15.  She would like to hear from Dr [name] on this, please.

## 2019-09-24 NOTE — Telephone Encounter (Signed)
Patient's wife calling stating they would like to speak with [name] this week. She states they would like [name] to review his EKG and that they were unaware he needed a monitor. She says the patient has surgery 2/15 and if they are unable to speak with [name] they may need to reschedule his surgery 2/15.

## 2019-09-24 NOTE — Progress Notes (Signed)
Cardiology Office Note    Date:  09/24/2019   ID:  CRAY PREW, DOB 1946-02-28, MRN [id number]  PCP:  [name], MD  Cardiologist:  [name]   Chief Complaint: syncope  History of Present Illness:   Joe Davis is a 74 y.o. male with hx of OSA on CPAP, remote DVT and PE presents for syncope evaluation.   Prior hx of dyspnea. Echo 09/2014 with LVEF of 55-65% and grade 1 DD.  Exercise myoview 10/2015 was low risk stress nuclear study with normal perfusion and normal left ventricular regional and global systolic function. Hypertensive response to exercise.  Last seen by [name] 07/2018.  Went to ER 09/18/19 after syncopal episode. Workup reassuring - reviewed personally. His symptoms felt possible vasovagal. Discharged from ER.   Here today for follow up.  History provided by patient and significant other, [name] who is a Architectural technologist at [facility].  Per patient and significant other before passing out patient was working outside in the cold.  He was warming his hands on stove and suddenly felt dizzy and passed out.  Witnessed by significant other who noted no pulse initially and then bradycardia in 30s.  Loss of consciousness for about 30 to 45 seconds.  Work-up in ER as above.  No recurrent episode.  Patient reports similar episode in 2016 after he came to home from hip surgery. He took a hot shower and then had vasovagal syncope.  He was seen by Dr. Mare Ferrari at that time.  Past Medical History:  Diagnosis Date  . Arthritis    "knees, hips" (10/10/2014)  . Basal cell carcinoma   . BPH (benign prostatic hyperplasia)   . Bradycardia vasovagal response 10 yrs ago   asymptomatic  . Diastolic dysfunction    Per echo in 2008  . DVT (deep venous thrombosis) ([place]) 1979   RLE  . Erectile dysfunction   . History of gout   . History of nuclear stress test    ETT-Myoview 3/17: Hypertensive blood pressure response to exercise, normal perfusion, EF  60%, low risk study  . Hypercholesterolemia    ? statin intolerant. May tolerate Lipitor  . Normal nuclear stress test May 2011  . Obesity   . Pulmonary embolism ([place]) 1979  . Syncope and collapse 8-10 yrs ago    Past Surgical History:  Procedure Laterality Date  . CARDIOVASCULAR STRESS TEST  May 2011   EF 73%; No ischemia  . COLONOSCOPY  2013  . JOINT REPLACEMENT    . KNEE ARTHROSCOPY Right 10/03/2012   Procedure: RIGHT KNEE ARTHROSCOPY WITH DEBRIDEMENT;  Surgeon: [name], MD;  Location: [facility];  Service: Orthopedics;  Laterality: Right;  WITH DEBRIDEMENT  . MOHS SURGERY  6 yrs    nose  . TOTAL HIP ARTHROPLASTY Right 10/06/2014    Current Medications:  Prior to Admission medications   Medication Sig Start Date End Date Taking? Authorizing Provider  aspirin 81 MG tablet Take 162 mg by mouth every morning.    Yes [provider]  atorvastatin (LIPITOR) 10 MG tablet Take 1 tablet (10 mg total) by mouth daily. Please call and schedule an appointment for further refills 1st attempt 03/29/18  Yes Josue Hector, MD  tadalafil (CIALIS) 5 MG tablet TK 1  T PO D UTD 11/01/17  Yes [provider]  timolol (TIMOPTIC) 0.5 % ophthalmic solution PLACE 1 GTT IN OU BID 05/15/17  Yes [provider]  Allergies:   Patient has no active allergies.   Social History   Socioeconomic History  . Marital status: Divorced    Spouse name: Not on file  . Number of children: Not on file  . Years of education: Not on file  . Highest education level: Not on file  Occupational History  . Not on file  Tobacco Use  . Smoking status: Former Smoker    Packs/day: 1.00    Years: 10.00    Pack years: 10.00    Types: Cigarettes    Quit date: 08/15/1977    Years since quitting: 42.1  . Smokeless tobacco: Never Used  Substance and Sexual Activity  . Alcohol use: Yes    Alcohol/week: 7.0 standard drinks    Types: 7 Shots of liquor per week  . Drug use: No  . Sexual  activity: Yes  Other Topics Concern  . Not on file  Social History Narrative  . Not on file   Social Determinants of Health   Financial Resource Strain:   . Difficulty of Paying Living Expenses: Not on file  Food Insecurity:   . Worried About Charity fundraiser in the Last Year: Not on file  . Ran Out of Food in the Last Year: Not on file  Transportation Needs:   . Lack of Transportation (Medical): Not on file  . Lack of Transportation (Non-Medical): Not on file  Physical Activity:   . Days of Exercise per Week: Not on file  . Minutes of Exercise per Session: Not on file  Stress:   . Feeling of Stress : Not on file  Social Connections:   . Frequency of Communication with Friends and Family: Not on file  . Frequency of Social Gatherings with Friends and Family: Not on file  . Attends Religious Services: Not on file  . Active Member of Clubs or Organizations: Not on file  . Attends Archivist Meetings: Not on file  . Marital Status: Not on file     Family History:  The patient's He was adopted. Family history is unknown by patient.  ROS:   Please see the history of present illness.    ROS All other systems reviewed and are negative.   PHYSICAL EXAM:   VS:  BP 130/80   Pulse 77   Ht 5\' 10"  (1.778 m)   Wt 240 lb (108.9 kg)   SpO2 97%   BMI 34.44 kg/m    GEN: Well nourished, well developed, in no acute distress  HEENT: normal  Neck: no JVD, carotid bruits, or masses Cardiac: RRR; no murmurs, rubs, or gallops,no edema  Respiratory:  clear to auscultation bilaterally, normal work of breathing GI: soft, nontender, nondistended, + BS MS: no deformity or atrophy  Skin: warm and dry, no rash Neuro:  Alert and Oriented x 3, Strength and sensation are intact Psych: euthymic mood, full affect  Wt Readings from Last 3 Encounters:  09/24/19 240 lb (108.9 kg)  09/18/19 237 lb (107.5 kg)  10/26/18 225 lb (102.1 kg)      Studies/Labs Reviewed:   EKG:  EKG is  not ordered today.  The ekg done 09/18/19 demonstrates SR at rate of 60 bpm.   Recent Labs: 09/18/2019: BUN 18; Creatinine, Ser 0.81; Hemoglobin 15.5; Platelets 180; Potassium 4.4; Sodium 139   Lipid Panel    Component Value Date/Time   CHOL 166 07/23/2018 0936   TRIG 113 07/23/2018 0936   HDL 46 07/23/2018 0936   CHOLHDL  3.6 07/23/2018 0936   CHOLHDL 3.4 12/15/2015 0802   VLDL 17 12/15/2015 0802   LDLCALC 97 07/23/2018 0936   LDLDIRECT 125.7 09/25/2012 0914    Additional studies/ records that were reviewed today include:   As summarized above  ASSESSMENT & PLAN:    1. Syncope  His syncope witnessed by significant other who is nurse anesthesiologist at [employer].  Syncope occurred after being in a cold outside.  Significant other has noted no pulse initially and then bradycardia.  No CPR.  Differential includes vasovagal.  Does have prior history of similar episode in 2016 (followed of echo and stress test was reassuring). -Will update echocardiogram and get 30-day event monitor.  2.  History of DVT and PE -He takes aspirin 81 mg and occasionally 162 mg daily. Advised to reduce to 81mg  qd.   3.  Preoperative clearance -Per significant other patient is scheduled for cataract and glaucoma surgery next week.  Recommended surgical clearance request from provider.  He is active and doing yard work without any shortness of breath or chest discomfort.  Getting > 4 Mets of activity. Recent syncope as above.  Will try to update echocardiogram prior to surgery.  After echo, if surgical clearance request comes >>needs to review with [name] if needs to wait until monitor resulted.  4. HLD - On lipitor 10mg  qd. Followed by PCP.    Medication Adjustments/Labs and Tests Ordered: Current medicines are reviewed at length with the patient today.  Concerns regarding medicines are outlined above.  Medication changes, Labs and Tests ordered today are listed in the Patient Instructions below. Patient  Instructions  Your physician recommends that you continue on your current medications as directed. Please refer to the Current Medication list given to you today.    Your physician has requested that you have an echocardiogram. Echocardiography is a painless test that uses sound waves to create images of your heart. It provides your doctor with information about the size and shape of your heart and how well your heart's chambers and valves are working. This procedure takes approximately one hour. There are no restrictions for this procedure.  IN THE NEXT 1-2 DAYS     Your physician has recommended that you wear an event monitor. Event monitors are medical devices that record the heart's electrical activity. Doctors most often [place] these monitors to diagnose arrhythmias. Arrhythmias are problems with the speed or rhythm of the heartbeat. The monitor is a small, portable device. You can wear one while you do your normal daily activities. This is usually used to diagnose what is causing palpitations/syncope (passing out).  [place]   Your physician recommends that you schedule a follow-up appointment in: [place], Isabella, PA  09/24/2019 3:50 PM    [facility] [address], [place], [place]  [address] Phone: [phone]; Fax: [phone]

## 2019-09-25 ENCOUNTER — Telehealth: Payer: Self-pay

## 2019-09-25 ENCOUNTER — Encounter: Payer: Self-pay | Admitting: Family Medicine

## 2019-09-25 ENCOUNTER — Telehealth: Payer: Self-pay | Admitting: Cardiovascular Disease

## 2019-09-25 ENCOUNTER — Ambulatory Visit: Payer: Medicare Other | Admitting: Family Medicine

## 2019-09-25 ENCOUNTER — Ambulatory Visit: Payer: Medicare Other | Attending: Internal Medicine

## 2019-09-25 DIAGNOSIS — Z20822 Contact with and (suspected) exposure to covid-19: Secondary | ICD-10-CM

## 2019-09-25 NOTE — Telephone Encounter (Signed)
   [facility] Pre-operative Risk Assessment    Request for surgical clearance:  1. What type of surgery is being performed? Cardiac Surgery [place] dual blade goniotomy   2. When is this surgery scheduled? 09/30/19  3. What type of clearance is required (medical clearance vs. Pharmacy clearance to hold med vs. Both)? Both   4. Are there any medications that need to be held prior to surgery and how long? Not that they are aware of  5. Practice name and name of physician performing surgery? [facility], Dr. Arlis Porta   6. What is your office phone number [phone]   7.   What is your office fax number [phone]  8.   Anesthesia type (None, local, MAC, general) ? [place] 09/25/2019, 1:30 PM  _________________________________________________________________   (provider comments below)

## 2019-09-25 NOTE — Telephone Encounter (Signed)
   Primary Cardiologist: Jenkins Rouge, MD  Chart reviewed as part of pre-operative protocol coverage. Cataract extractions are recognized in guidelines as low risk surgeries that do not typically require specific preoperative testing or holding of blood thinner therapy. Patient was recently seen by Robbie Lis, PA-C, on 09/24/2019 for evaluation of recent syncopal episode. Echo and Event Monitor were ordered for further evaluation. However, per [name], patient is OK to proceed with eye surgery before these studies are completed.   I will route this recommendation to the requesting party via Epic fax function and remove from pre-op pool.  Please call with questions.  Darreld Mclean, PA-C 09/25/2019, 3:16 PM

## 2019-09-25 NOTE — Telephone Encounter (Addendum)
Patient seen yesterday by Joe Lis, PA-C, for evaluation of syncope. Wife, who is a Architectural technologist, witnessed syncopal episodes and states patient initially had no pulse and was then bradycardic in the 30's. Echo and 30-day Event Monitor were ordered for further evaluation. Echo is scheduled to be done tomorrow 2/11.   Per office visit note yesterday: "He is active and doing yard work without any shortness of breath or chest discomfort.  Getting > 4 Mets of activity. Recent syncope as above.  Will try to update echocardiogram prior to surgery.  After echo, if surgical clearance request comes >>needs to review with [name] if needs to wait until monitor resulted."  Pre-op team will follow-up on Echo and go from there.   Joe Mclean, PA-C 09/25/2019, 1:45 PM  ADDENDUM 09/25/2019 2:15PM: There is a telephone note and MyChart message from [name] saying patient is cleared for cataract surgery without need to wait on Echo and Monitor results.   Given patient is also scheduled to have goniotomy which will be done under MAC, will route message to [name] to confirm that there is still no need to wait for Echo and Monitor results.  [name], Please route response back to P CV DIV PREOP.   Thank you!

## 2019-09-25 NOTE — Telephone Encounter (Signed)
Ok to get echo and monitor for syncope but he is clear to have cataract surgery next week before results of testing available

## 2019-09-25 NOTE — Telephone Encounter (Signed)
Patient was seen yesterday by PA. Please advise.

## 2019-09-25 NOTE — Telephone Encounter (Signed)
Called patient and spoke to significant other that is listed on DPR and she stated that she discussed with patient and they are going to wait on the follow up for the SOB due to other problems that are of more concern and will call back to schedule when ready.

## 2019-09-25 NOTE — Telephone Encounter (Signed)
Can have surgery

## 2019-09-25 NOTE — Telephone Encounter (Signed)
Called patient's partner (DPR) back with Dr. Kyla Balzarine recommendations.

## 2019-09-26 ENCOUNTER — Other Ambulatory Visit: Payer: Self-pay

## 2019-09-26 ENCOUNTER — Ambulatory Visit (HOSPITAL_COMMUNITY)
Admission: RE | Admit: 2019-09-26 | Discharge: 2019-09-26 | Disposition: A | Discharge status: Home / Self Care | Payer: Medicare Other | Source: Ambulatory Visit | Attending: Physician Assistant | Admitting: Physician Assistant

## 2019-09-26 DIAGNOSIS — I351 Nonrheumatic aortic (valve) insufficiency: Secondary | ICD-10-CM | POA: Insufficient documentation

## 2019-09-26 DIAGNOSIS — E785 Hyperlipidemia, unspecified: Secondary | ICD-10-CM | POA: Diagnosis not present

## 2019-09-26 DIAGNOSIS — R55 Syncope and collapse: Secondary | ICD-10-CM

## 2019-09-26 LAB — NOVEL CORONAVIRUS, NAA: SARS-CoV-2, NAA: NOT DETECTED

## 2019-09-26 NOTE — Progress Notes (Signed)
  Echocardiogram 2D Echocardiogram has been performed.  Joe Davis 09/26/2019, 11:38 AM

## 2019-09-28 DIAGNOSIS — H401332 Pigmentary glaucoma, bilateral, moderate stage: Secondary | ICD-10-CM | POA: Diagnosis not present

## 2019-09-28 DIAGNOSIS — Z20822 Contact with and (suspected) exposure to covid-19: Secondary | ICD-10-CM | POA: Diagnosis not present

## 2019-09-30 DIAGNOSIS — E669 Obesity, unspecified: Secondary | ICD-10-CM | POA: Diagnosis not present

## 2019-09-30 DIAGNOSIS — Z7982 Long term (current) use of aspirin: Secondary | ICD-10-CM | POA: Diagnosis not present

## 2019-09-30 DIAGNOSIS — G8929 Other chronic pain: Secondary | ICD-10-CM | POA: Diagnosis not present

## 2019-09-30 DIAGNOSIS — E7800 Pure hypercholesterolemia, unspecified: Secondary | ICD-10-CM | POA: Diagnosis not present

## 2019-09-30 DIAGNOSIS — Z86711 Personal history of pulmonary embolism: Secondary | ICD-10-CM | POA: Diagnosis not present

## 2019-09-30 DIAGNOSIS — M109 Gout, unspecified: Secondary | ICD-10-CM | POA: Diagnosis not present

## 2019-09-30 DIAGNOSIS — Z6834 Body mass index (BMI) 34.0-34.9, adult: Secondary | ICD-10-CM | POA: Diagnosis not present

## 2019-09-30 DIAGNOSIS — H401112 Primary open-angle glaucoma, right eye, moderate stage: Secondary | ICD-10-CM | POA: Diagnosis not present

## 2019-09-30 DIAGNOSIS — Z791 Long term (current) use of non-steroidal anti-inflammatories (NSAID): Secondary | ICD-10-CM | POA: Diagnosis not present

## 2019-09-30 DIAGNOSIS — Z79899 Other long term (current) drug therapy: Secondary | ICD-10-CM | POA: Diagnosis not present

## 2019-09-30 DIAGNOSIS — H2511 Age-related nuclear cataract, right eye: Secondary | ICD-10-CM | POA: Diagnosis not present

## 2019-09-30 DIAGNOSIS — Z87891 Personal history of nicotine dependence: Secondary | ICD-10-CM | POA: Diagnosis not present

## 2019-09-30 DIAGNOSIS — Z85828 Personal history of other malignant neoplasm of skin: Secondary | ICD-10-CM | POA: Diagnosis not present

## 2019-10-05 DIAGNOSIS — H401332 Pigmentary glaucoma, bilateral, moderate stage: Secondary | ICD-10-CM | POA: Diagnosis not present

## 2019-10-05 DIAGNOSIS — Z20822 Contact with and (suspected) exposure to covid-19: Secondary | ICD-10-CM | POA: Diagnosis not present

## 2019-10-07 DIAGNOSIS — Z86711 Personal history of pulmonary embolism: Secondary | ICD-10-CM | POA: Diagnosis not present

## 2019-10-07 DIAGNOSIS — R001 Bradycardia, unspecified: Secondary | ICD-10-CM | POA: Diagnosis not present

## 2019-10-07 DIAGNOSIS — E7800 Pure hypercholesterolemia, unspecified: Secondary | ICD-10-CM | POA: Diagnosis not present

## 2019-10-07 DIAGNOSIS — Z9841 Cataract extraction status, right eye: Secondary | ICD-10-CM | POA: Diagnosis not present

## 2019-10-07 DIAGNOSIS — Z7982 Long term (current) use of aspirin: Secondary | ICD-10-CM | POA: Diagnosis not present

## 2019-10-07 DIAGNOSIS — H401122 Primary open-angle glaucoma, left eye, moderate stage: Secondary | ICD-10-CM | POA: Diagnosis not present

## 2019-10-07 DIAGNOSIS — H2512 Age-related nuclear cataract, left eye: Secondary | ICD-10-CM | POA: Diagnosis not present

## 2019-10-07 DIAGNOSIS — Z9889 Other specified postprocedural states: Secondary | ICD-10-CM | POA: Diagnosis not present

## 2019-10-07 DIAGNOSIS — Z79899 Other long term (current) drug therapy: Secondary | ICD-10-CM | POA: Diagnosis not present

## 2019-10-07 DIAGNOSIS — Z87891 Personal history of nicotine dependence: Secondary | ICD-10-CM | POA: Diagnosis not present

## 2019-10-07 DIAGNOSIS — E668 Other obesity: Secondary | ICD-10-CM | POA: Diagnosis not present

## 2019-10-07 DIAGNOSIS — Z961 Presence of intraocular lens: Secondary | ICD-10-CM | POA: Diagnosis not present

## 2019-10-07 DIAGNOSIS — Z791 Long term (current) use of non-steroidal anti-inflammatories (NSAID): Secondary | ICD-10-CM | POA: Diagnosis not present

## 2019-10-08 ENCOUNTER — Telehealth: Payer: Medicare Other | Admitting: Cardiovascular Disease

## 2019-10-30 DIAGNOSIS — Z96641 Presence of right artificial hip joint: Secondary | ICD-10-CM | POA: Diagnosis not present

## 2019-10-30 DIAGNOSIS — M1612 Unilateral primary osteoarthritis, left hip: Secondary | ICD-10-CM | POA: Diagnosis not present

## 2019-10-30 DIAGNOSIS — M1712 Unilateral primary osteoarthritis, left knee: Secondary | ICD-10-CM | POA: Diagnosis not present

## 2019-10-30 DIAGNOSIS — M1711 Unilateral primary osteoarthritis, right knee: Secondary | ICD-10-CM | POA: Diagnosis not present

## 2019-10-30 DIAGNOSIS — Z09 Encounter for follow-up examination after completed treatment for conditions other than malignant neoplasm: Secondary | ICD-10-CM | POA: Diagnosis not present

## 2019-11-04 ENCOUNTER — Encounter: Payer: Self-pay | Admitting: *Deleted

## 2019-11-04 DIAGNOSIS — M1612 Unilateral primary osteoarthritis, left hip: Secondary | ICD-10-CM | POA: Diagnosis not present

## 2019-11-04 NOTE — Progress Notes (Signed)
Patient ID: Joe Davis, male   DOB: May 10, 1946, 74 y.o.   MRN: [id number] 11/01/19 cancellation notification received from [employer].  Patient has requested cancellation of their monitoring service.  [employer] services will begin the monitor recovery process.

## 2019-11-06 DIAGNOSIS — Z961 Presence of intraocular lens: Secondary | ICD-10-CM | POA: Diagnosis not present

## 2019-11-13 ENCOUNTER — Telehealth: Payer: Medicare Other | Admitting: Cardiovascular Disease

## 2019-12-18 DIAGNOSIS — M1711 Unilateral primary osteoarthritis, right knee: Secondary | ICD-10-CM | POA: Diagnosis not present

## 2019-12-19 DIAGNOSIS — M1711 Unilateral primary osteoarthritis, right knee: Secondary | ICD-10-CM | POA: Diagnosis not present

## 2019-12-25 NOTE — Progress Notes (Signed)
Cardiology Office Note    Date:  01/03/2020   ID:  MARCIA RODAS, DOB 1946/01/27, MRN [id number]  PCP:  [name], MD  Cardiologist:  [name]   Chief Complaint: syncope  History of Present Illness:   Joe Davis is a 74 y.o. male with hx of OSA on CPAP, remote DVT and PE last seen by PA 09/2019 He is on statin for HLD   Prior hx of dyspnea. Echo 09/2014 with LVEF of 55-65% and grade 1 DD.  Exercise myoview 10/2015 was low risk stress nuclear study with normal perfusion and normal left ventricular regional and global systolic function. Hypertensive response to exercise.  Went to ER 09/18/19 after syncopal episode. Workup reassuring - reviewed personally. His symptoms felt possible vasovagal. Discharged from ER.   Here today for follow up.  History provided by patient and significant other, [name] who is a Architectural technologist at baseline on hospital.  Per patient and significant other before passing out patient was working outside in the cold.  He was warming his hands on stove and suddenly felt dizzy and passed out.  Witnessed by significant other who noted no pulse initially and then bradycardia in 30s.  Loss of consciousness for about 30 to 45 seconds.  Work-up in ER as above.  No recurrent episode.  Patient reports similar episode in 2016 after he came to home from hip surgery. He took a hot shower and then had vasovagal syncope.  He was seen by Dr. Mare Ferrari at that time.  Monitor and echo ordered Patient cancelled monitor TTE reviewed 09/26/19 normal EF 55-60% no significant valve disease and normal RV with no signs of pulmnary HTN.  Trivial AR and AV sclerosis   Since then has had uneventful cataract surgery  Weight is up 15 lbs since March 2020   He has no chest pain. Mild dyspnea he attributes to weight Seeing ortho at [facility] and may need both hip and knee Surgery  Discussed coronary calcium scoring to better target LDL goal and if unusually high doing stress testing  before his orthopedic surgery   Past Medical History:  Diagnosis Date  . Arthritis    "knees, hips" (10/10/2014)  . Basal cell carcinoma   . BPH (benign prostatic hyperplasia)   . Bradycardia vasovagal response 10 yrs ago   asymptomatic  . Diastolic dysfunction    Per echo in 2008  . DVT (deep venous thrombosis) ([place]) 1979   RLE  . Erectile dysfunction   . History of gout   . History of nuclear stress test    ETT-Myoview 3/17: Hypertensive blood pressure response to exercise, normal perfusion, EF 60%, low risk study  . Hypercholesterolemia    ? statin intolerant. May tolerate Lipitor  . Normal nuclear stress test May 2011  . Obesity   . Pulmonary embolism ([place]) 1979  . Syncope and collapse 8-10 yrs ago    Past Surgical History:  Procedure Laterality Date  . CARDIOVASCULAR STRESS TEST  May 2011   EF 73%; No ischemia  . COLONOSCOPY  2013  . JOINT REPLACEMENT    . KNEE ARTHROSCOPY Right 10/03/2012   Procedure: RIGHT KNEE ARTHROSCOPY WITH DEBRIDEMENT;  Surgeon: [name], MD;  Location: [facility];  Service: Orthopedics;  Laterality: Right;  WITH DEBRIDEMENT  . MOHS SURGERY  6 yrs    nose  . TOTAL HIP ARTHROPLASTY Right 10/06/2014    Current Medications:  Prior to Admission medications   Medication Sig Start Date  End Date Taking? Authorizing Provider  aspirin 81 MG tablet Take 162 mg by mouth every morning.    Yes [provider]  atorvastatin (LIPITOR) 10 MG tablet Take 1 tablet (10 mg total) by mouth daily. Please call and schedule an appointment for further refills 1st attempt 03/29/18  Yes Josue Hector, MD  tadalafil (CIALIS) 5 MG tablet TK 1  T PO D UTD 11/01/17  Yes [provider]  timolol (TIMOPTIC) 0.5 % ophthalmic solution PLACE 1 GTT IN OU BID 05/15/17  Yes [provider]    Allergies:   Patient has no active allergies.   Social History   Socioeconomic History  . Marital status: Divorced    Spouse name: Not on file  . Number of  children: Not on file  . Years of education: Not on file  . Highest education level: Not on file  Occupational History  . Not on file  Tobacco Use  . Smoking status: Former Smoker    Packs/day: 1.00    Years: 10.00    Pack years: 10.00    Types: Cigarettes    Quit date: 08/15/1977    Years since quitting: 42.4  . Smokeless tobacco: Never Used  Substance and Sexual Activity  . Alcohol use: Yes    Alcohol/week: 7.0 standard drinks    Types: 7 Shots of liquor per week  . Drug use: No  . Sexual activity: Yes  Other Topics Concern  . Not on file  Social History Narrative  . Not on file   Social Determinants of Health   Financial Resource Strain:   . Difficulty of Paying Living Expenses:   Food Insecurity:   . Worried About Charity fundraiser in the Last Year:   . Arboriculturist in the Last Year:   Transportation Needs:   . Film/video editor (Medical):   Marland Kitchen Lack of Transportation (Non-Medical):   Physical Activity:   . Days of Exercise per Week:   . Minutes of Exercise per Session:   Stress:   . Feeling of Stress :   Social Connections:   . Frequency of Communication with Friends and Family:   . Frequency of Social Gatherings with Friends and Family:   . Attends Religious Services:   . Active Member of Clubs or Organizations:   . Attends Archivist Meetings:   Marland Kitchen Marital Status:      Family History:  The patient's He was adopted. Family history is unknown by patient.  ROS:   Please see the history of present illness.    ROS All other systems reviewed and are negative.   PHYSICAL EXAM:   VS:  BP 122/86   Pulse 86   Ht 5\' 10"  (1.778 m)   Wt 236 lb (107 kg)   SpO2 97%   BMI 33.86 kg/m     Affect appropriate Healthy:  appears stated age HEENT: normal Neck supple with no adenopathy JVP normal no bruits no thyromegaly Lungs clear with no wheezing and good diaphragmatic motion Heart:  S1/S2 no murmur, no rub, gallop or click PMI normal Abdomen:  benighn, BS positve, no tenderness, no AAA no bruit.  No HSM or HJR Distal pulses intact with no bruits No edema Neuro non-focal Skin warm and dry No muscular weakness   Wt Readings from Last 3 Encounters:  01/03/20 236 lb (107 kg)  09/24/19 240 lb (108.9 kg)  09/18/19 237 lb (107.5 kg)      Studies/Labs  Reviewed:   EKG:   SR LAFB normal QT 427  Recent Labs: 09/18/2019: BUN 18; Creatinine, Ser 0.81; Hemoglobin 15.5; Platelets 180; Potassium 4.4; Sodium 139   Lipid Panel    Component Value Date/Time   CHOL 166 07/23/2018 0936   TRIG 113 07/23/2018 0936   HDL 46 07/23/2018 0936   CHOLHDL 3.6 07/23/2018 0936   CHOLHDL 3.4 12/15/2015 0802   VLDL 17 12/15/2015 0802   LDLCALC 97 07/23/2018 0936   LDLDIRECT 125.7 09/25/2012 0914    Additional studies/ records that were reviewed today include:   TTE 09/26/19  ASSESSMENT & PLAN:    1. Syncope : normal echo monitor cancelled by patient likely vasavogal no further w/u at this time   2.  History of DVT and PE: normal RV function on TTE ASA   3.  HLD- On lipitor 10mg  qd. Followed by PCP. LDL 97 in 2019 discussed      possibility of calcium score to further establish target goal for LDL  He indicates not really taking his statin as he is a bit pill averse will check labs today    4. Pre Op: normal EF by echo 09/26/19 normal stress test 2017 negative ER w/u recently for arrhythmia If calcium score low for age will clear for surgery if high will do lexiscan myovue     Medication Adjustments/Labs and Tests Ordered: Current medicines are reviewed at length with the patient today.  Concerns regarding medicines are outlined above.  Medication changes, Labs and Tests ordered today are listed in the Patient Instructions below. Patient Instructions  Medication Instructions:  *If you need a refill on your cardiac medications before your next appointment, please call your pharmacy*  Lab Work: Your physician recommends that you have lab  work today- fasting lipid and liver panel.   If you have labs (blood work) drawn today and your tests are completely normal, you will receive your results only by: Marland Kitchen MyChart Message (if you have MyChart) OR . A paper copy in the mail If you have any lab test that is abnormal or we need to change your treatment, we will call you to review the results.  Testing/Procedures: Non-Cardiac CT scanning, (CAT scanning), is a noninvasive, special x-ray that produces cross-sectional images of the body using x-rays and a computer. CT scans help physicians diagnose and treat medical conditions. For some CT exams, a contrast material is used to enhance visibility in the area of the body being studied. CT scans provide greater clarity and reveal more details than regular x-ray exams.  Follow-Up: At [facility], you and your health needs are our priority.  As part of our continuing mission to provide you with exceptional heart care, we have created designated Provider Care Teams.  These Care Teams include your primary Cardiologist (physician) and Advanced Practice Providers (APPs -  Physician Assistants and Nurse Practitioners) who all work together to provide you with the care you need, when you need it.  We recommend signing up for the patient portal called "MyChart".  Sign up information is provided on this After Visit Summary.  MyChart is used to connect with patients for Virtual Visits (Telemedicine).  Patients are able to view lab/test results, encounter notes, upcoming appointments, etc.  Non-urgent messages can be sent to your provider as well.   To learn more about what you can do with MyChart, go to [email or web address].    Your next appointment:   6 month(s)  The format for your next appointment:  In Person  Provider:   You may see Jenkins Rouge, MD or one of the following Advanced Practice Providers on your designated Care Team:    Truitt Merle, NP  Cecilie Kicks, NP  Kathyrn Drown,  NP      Signed, Jenkins Rouge, MD  01/03/2020 10:38 AM    [facility] [address], [place], [place]  [address] Phone: [phone]; Fax: [phone]

## 2020-01-01 DIAGNOSIS — M1711 Unilateral primary osteoarthritis, right knee: Secondary | ICD-10-CM | POA: Diagnosis not present

## 2020-01-03 ENCOUNTER — Ambulatory Visit (INDEPENDENT_AMBULATORY_CARE_PROVIDER_SITE_OTHER): Payer: Medicare Other | Admitting: Cardiovascular Disease

## 2020-01-03 ENCOUNTER — Encounter: Payer: Self-pay | Admitting: Cardiovascular Disease

## 2020-01-03 ENCOUNTER — Telehealth: Payer: Self-pay

## 2020-01-03 ENCOUNTER — Ambulatory Visit
Admission: RE | Admit: 2020-01-03 | Discharge: 2020-01-03 | Disposition: A | Discharge status: Home / Self Care | Payer: Medicare Other | Source: Ambulatory Visit | Attending: Cardiovascular Disease | Admitting: Cardiovascular Disease

## 2020-01-03 ENCOUNTER — Other Ambulatory Visit: Payer: Self-pay

## 2020-01-03 VITALS — BP 122/86 | HR 86 | Ht 70.0 in | Wt 236.0 lb

## 2020-01-03 DIAGNOSIS — R0600 Dyspnea, unspecified: Secondary | ICD-10-CM

## 2020-01-03 DIAGNOSIS — R55 Syncope and collapse: Secondary | ICD-10-CM

## 2020-01-03 DIAGNOSIS — R0602 Shortness of breath: Secondary | ICD-10-CM

## 2020-01-03 DIAGNOSIS — R911 Solitary pulmonary nodule: Secondary | ICD-10-CM

## 2020-01-03 DIAGNOSIS — Z01818 Encounter for other preprocedural examination: Secondary | ICD-10-CM

## 2020-01-03 DIAGNOSIS — E785 Hyperlipidemia, unspecified: Secondary | ICD-10-CM

## 2020-01-03 DIAGNOSIS — Z87891 Personal history of nicotine dependence: Secondary | ICD-10-CM

## 2020-01-03 DIAGNOSIS — R931 Abnormal findings on diagnostic imaging of heart and coronary circulation: Secondary | ICD-10-CM

## 2020-01-03 DIAGNOSIS — R918 Other nonspecific abnormal finding of lung field: Secondary | ICD-10-CM

## 2020-01-03 DIAGNOSIS — R079 Chest pain, unspecified: Secondary | ICD-10-CM

## 2020-01-03 LAB — LIPID PANEL
Chol/HDL Ratio: 3.8 ratio (ref 0.0–5.0)
Cholesterol, Total: 192 mg/dL (ref 100–199)
HDL: 50 mg/dL (ref >39)
LDL Chol Calc (NIH): 129 mg/dL — ABNORMAL HIGH (ref 0–99)
Triglycerides: 70 mg/dL (ref 0–149)
VLDL Cholesterol Cal: 13 mg/dL (ref 5–40)

## 2020-01-03 LAB — HEPATIC FUNCTION PANEL
ALT: 18 IU/L (ref 0–44)
AST: 21 IU/L (ref 0–40)
Albumin: 4.1 g/dL (ref 3.7–4.7)
Alkaline Phosphatase: 78 IU/L (ref 48–121)
Bilirubin Total: 0.5 mg/dL (ref 0.0–1.2)
Bilirubin, Direct: 0.18 mg/dL (ref 0.00–0.40)
Total Protein: 6.5 g/dL (ref 6.0–8.5)

## 2020-01-03 NOTE — Telephone Encounter (Signed)
-----   Message from Josue Hector, MD sent at 01/03/2020  4:32 PM EDT ----- Calcium score high needs lexiscan myovue to clear for hip surgery  Lung nodule stable see recs from radiology nees f/u CT 14months

## 2020-01-03 NOTE — Patient Instructions (Addendum)
Medication Instructions:  *If you need a refill on your cardiac medications before your next appointment, please call your pharmacy*  Lab Work: Your physician recommends that you have lab work today- fasting lipid and liver panel.   If you have labs (blood work) drawn today and your tests are completely normal, you will receive your results only by: Marland Kitchen MyChart Message (if you have MyChart) OR . A paper copy in the mail If you have any lab test that is abnormal or we need to change your treatment, we will call you to review the results.  Testing/Procedures: Non-Cardiac CT scanning, (CAT scanning), is a noninvasive, special x-ray that produces cross-sectional images of the body using x-rays and a computer. CT scans help physicians diagnose and treat medical conditions. For some CT exams, a contrast material is used to enhance visibility in the area of the body being studied. CT scans provide greater clarity and reveal more details than regular x-ray exams.  Follow-Up: At [facility], you and your health needs are our priority.  As part of our continuing mission to provide you with exceptional heart care, we have created designated Provider Care Teams.  These Care Teams include your primary Cardiologist (physician) and Advanced Practice Providers (APPs -  Physician Assistants and Nurse Practitioners) who all work together to provide you with the care you need, when you need it.  We recommend signing up for the patient portal called "MyChart".  Sign up information is provided on this After Visit Summary.  MyChart is used to connect with patients for Virtual Visits (Telemedicine).  Patients are able to view lab/test results, encounter notes, upcoming appointments, etc.  Non-urgent messages can be sent to your provider as well.   To learn more about what you can do with MyChart, go to [email or web address].    Your next appointment:   6 month(s)  The format for your next appointment:   In  Person  Provider:   You may see Jenkins Rouge, MD or one of the following Advanced Practice Providers on your designated Care Team:    Truitt Merle, NP  Cecilie Kicks, NP  Kathyrn Drown, NP

## 2020-01-03 NOTE — Telephone Encounter (Signed)
Patient aware of results. Per [name], Calcium score is very high needs to take statin with target LDL 70 or less Schedule lexiscan myovue to clear for ortho surgery, also patient has stable lung nodules. Patient would like to be referred to pulmonology for lung nodules. [name] is fine with referral. Will send instruction for myoview to patient's mychart.

## 2020-01-07 ENCOUNTER — Telehealth (HOSPITAL_COMMUNITY): Payer: Self-pay | Admitting: *Deleted

## 2020-01-07 NOTE — Telephone Encounter (Signed)
Patient given detailed instructions per Myocardial Perfusion Study Information Sheet for the test on 01/08/20. Patient notified to arrive 15 minutes early and that it is imperative to arrive on time for appointment to keep from having the test rescheduled.  If you need to cancel or reschedule your appointment, please call the office within 24 hours of your appointment. . Patient verbalized understanding. Davis, Joe Jacqueline    

## 2020-01-08 ENCOUNTER — Telehealth: Payer: Self-pay | Admitting: Cardiovascular Disease

## 2020-01-08 ENCOUNTER — Other Ambulatory Visit: Payer: Self-pay

## 2020-01-08 ENCOUNTER — Ambulatory Visit (HOSPITAL_COMMUNITY): Payer: Medicare Other | Attending: Cardiology

## 2020-01-08 DIAGNOSIS — Z01810 Encounter for preprocedural cardiovascular examination: Secondary | ICD-10-CM

## 2020-01-08 DIAGNOSIS — R931 Abnormal findings on diagnostic imaging of heart and coronary circulation: Secondary | ICD-10-CM | POA: Insufficient documentation

## 2020-01-08 DIAGNOSIS — Z01818 Encounter for other preprocedural examination: Secondary | ICD-10-CM | POA: Insufficient documentation

## 2020-01-08 DIAGNOSIS — R0600 Dyspnea, unspecified: Secondary | ICD-10-CM | POA: Insufficient documentation

## 2020-01-08 DIAGNOSIS — R079 Chest pain, unspecified: Secondary | ICD-10-CM | POA: Diagnosis not present

## 2020-01-08 LAB — MYOCARDIAL PERFUSION IMAGING
LV dias vol: 94 mL (ref 62–150)
LV sys vol: 42 mL
Peak HR: 94 {beats}/min
Rest HR: 61 {beats}/min
SDS: 1
SRS: 0
SSS: 1
TID: 1.11

## 2020-01-08 MED ORDER — TECHNETIUM TC 99M TETROFOSMIN IV KIT
31.8000 | PACK | Freq: Once | INTRAVENOUS | Status: AC | PRN
  Administered 2020-01-08: 31.8 via INTRAVENOUS
  Filled 2020-01-08: qty 32

## 2020-01-08 MED ORDER — TECHNETIUM TC 99M TETROFOSMIN IV KIT
11.0000 | PACK | Freq: Once | INTRAVENOUS | Status: AC | PRN
  Administered 2020-01-08: 11 via INTRAVENOUS
  Filled 2020-01-08: qty 11

## 2020-01-08 MED ORDER — REGADENOSON 0.4 MG/5ML IV SOLN
0.4000 mg | Freq: Once | INTRAVENOUS | Status: AC
  Administered 2020-01-08: 0.4 mg via INTRAVENOUS

## 2020-01-08 NOTE — Telephone Encounter (Signed)
Patient's wife, Arville Go, is requesting to accompany the patient during his stress test scheduled for today, 01/08/20, at 10:00 AM. She states she will go in with the patient due to him requiring assistance with mobility and interpretation. Please advise.

## 2020-01-09 ENCOUNTER — Telehealth: Payer: Self-pay | Admitting: Cardiovascular Disease

## 2020-01-09 NOTE — Telephone Encounter (Signed)
Pt notified of results

## 2020-01-09 NOTE — Telephone Encounter (Signed)
Follow Up:   Pt is returning a call from Arbie Cookey this morning, concerning his Stress Test results.

## 2020-01-22 ENCOUNTER — Telehealth: Payer: Self-pay | Admitting: Cardiovascular Disease

## 2020-01-22 ENCOUNTER — Other Ambulatory Visit: Payer: Self-pay | Admitting: Cardiovascular Disease

## 2020-01-22 DIAGNOSIS — R079 Chest pain, unspecified: Secondary | ICD-10-CM

## 2020-01-22 DIAGNOSIS — R911 Solitary pulmonary nodule: Secondary | ICD-10-CM

## 2020-01-22 DIAGNOSIS — R0602 Shortness of breath: Secondary | ICD-10-CM

## 2020-01-22 DIAGNOSIS — R9389 Abnormal findings on diagnostic imaging of other specified body structures: Secondary | ICD-10-CM

## 2020-01-22 NOTE — Telephone Encounter (Signed)
Talked with Wells Guiles at [facility]. She was calling about the patient being scheduled for a CT Lung Cancer screening on Friday, but he does not qualify for the program. Looked at order from 01/03/20, patient is suppose to have a CT chest without to follow up on lung nodules in 6 months (November) Informed Wells Guiles that patient will need CT chest without in November and to please cancel CT for lung CA screening. Wells Guiles stated that their schedulers would take care of this and reschedule.

## 2020-01-22 NOTE — Telephone Encounter (Signed)
[facility] is calling regarding an order that was placed on patient, advised it needs to be corrected based on his history.

## 2020-01-24 ENCOUNTER — Inpatient Hospital Stay: Admission: RE | Admit: 2020-01-24 | Payer: Medicare Other | Source: Ambulatory Visit

## 2020-02-04 DIAGNOSIS — H401332 Pigmentary glaucoma, bilateral, moderate stage: Secondary | ICD-10-CM | POA: Diagnosis not present

## 2020-02-06 ENCOUNTER — Encounter: Payer: Self-pay | Admitting: Emergency Medicine

## 2020-02-06 ENCOUNTER — Other Ambulatory Visit: Payer: Self-pay

## 2020-02-06 ENCOUNTER — Ambulatory Visit (INDEPENDENT_AMBULATORY_CARE_PROVIDER_SITE_OTHER): Payer: Medicare Other | Admitting: Emergency Medicine

## 2020-02-06 DIAGNOSIS — R9389 Abnormal findings on diagnostic imaging of other specified body structures: Secondary | ICD-10-CM | POA: Insufficient documentation

## 2020-02-06 DIAGNOSIS — R931 Abnormal findings on diagnostic imaging of heart and coronary circulation: Secondary | ICD-10-CM

## 2020-02-06 DIAGNOSIS — R918 Other nonspecific abnormal finding of lung field: Secondary | ICD-10-CM | POA: Diagnosis not present

## 2020-02-06 NOTE — Assessment & Plan Note (Signed)
He has minimal tobacco history but based on size criteria the 6 mm right lower lobe pulmonary nodule should be followed for interval stability.  A dedicated CT chest has been ordered for November and I agree with this.  I will review with him after it has been completed and then plan appropriate follow-up.  I did discuss with him the possible need to investigate further if there is interval change, including possible diagnostics like PET scan or bronchoscopy.

## 2020-02-06 NOTE — Progress Notes (Signed)
Subjective:    Patient ID: Joe Davis, male    DOB: 1946-08-09, 74 y.o.   MRN: [id number]  HPI 74 yo man, former smoker (10 pk-yrs), history of DVT and PE (1979) on coumadin for years now off, hypertension with diastolic dysfunction, hyperlipidemia, OSA not on CPAP.  He has had syncope felt to be vasovagal. He underwent cardiac CT 01/03/2020 which I have reviewed, shows pulmonary nodular disease that includes a 4 mm peripheral right lower lobe nodule, 6 mm posterior medial right lower lobe nodule (new from 2013), subtle 4 mm left upper lobe nodule.  Underwent subsequent Myoview that was reassuring.    He does have exertional SOB - hasn't been able to swim at the [facility] since COVID, feels that he has lost some conditioning. No cough, no CP. He has gained some wt - working on losing it.    Review of Systems As per HPI  Past Medical History:  Diagnosis Date  . Arthritis    "knees, hips" (10/10/2014)  . Basal cell carcinoma   . BPH (benign prostatic hyperplasia)   . Bradycardia vasovagal response 10 yrs ago   asymptomatic  . Diastolic dysfunction    Per echo in 2008  . DVT (deep venous thrombosis) ([place]) 1979   RLE  . Erectile dysfunction   . History of gout   . History of nuclear stress test    ETT-Myoview 3/17: Hypertensive blood pressure response to exercise, normal perfusion, EF 60%, low risk study  . Hypercholesterolemia    ? statin intolerant. May tolerate Lipitor  . Normal nuclear stress test May 2011  . Obesity   . Pulmonary embolism ([place]) 1979  . Syncope and collapse 8-10 yrs ago     Family History  Adopted: Yes  Family history unknown: Yes     Social History   Socioeconomic History  . Marital status: Divorced    Spouse name: Not on file  . Number of children: Not on file  . Years of education: Not on file  . Highest education level: Not on file  Occupational History  . Not on file  Tobacco Use  . Smoking status: Former Smoker    Packs/day: 1.00     Years: 10.00    Pack years: 10.00    Types: Cigarettes    Quit date: 08/15/1977    Years since quitting: 42.5  . Smokeless tobacco: Never Used  Vaping Use  . Vaping Use: Never used  Substance and Sexual Activity  . Alcohol use: Yes    Alcohol/week: 7.0 standard drinks    Types: 7 Shots of liquor per week  . Drug use: No  . Sexual activity: Yes  Other Topics Concern  . Not on file  Social History Narrative  . Not on file   Social Determinants of Health   Financial Resource Strain:   . Difficulty of Paying Living Expenses:   Food Insecurity:   . Worried About Charity fundraiser in the Last Year:   . Arboriculturist in the Last Year:   Transportation Needs:   . Film/video editor (Medical):   Marland Kitchen Lack of Transportation (Non-Medical):   Physical Activity:   . Days of Exercise per Week:   . Minutes of Exercise per Session:   Stress:   . Feeling of Stress :   Social Connections:   . Frequency of Communication with Friends and Family:   . Frequency of Social Gatherings with Friends and Family:   .  Attends Religious Services:   . Active Member of Clubs or Organizations:   . Attends Archivist Meetings:   Marland Kitchen Marital Status:   Intimate Partner Violence:   . Fear of Current or Ex-Partner:   . Emotionally Abused:   Marland Kitchen Physically Abused:   . 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  No known asbestos exposure Was in the [place], [place], no agent orange   No Active Allergies   Outpatient Medications Prior to Visit  Medication Sig Dispense Refill  . aspirin 81 MG tablet Take 162 mg by mouth every morning.     Marland Kitchen atorvastatin (LIPITOR) 10 MG tablet Take 1 tablet (10 mg total) by mouth daily. Please call and schedule an appointment for further refills 1st attempt 90 tablet 0  . hydrochlorothiazide (HYDRODIURIL) 25 MG tablet Take 25 mg by mouth daily.    . Ibuprofen (ADVIL  PO) Take by mouth.    . tadalafil (CIALIS) 5 MG tablet TK 1  T PO D UTD  10   No facility-administered medications prior to visit.        Objective:   Physical Exam Vitals:   02/06/20 0951  BP: 136/80  Pulse: 66  Temp: 98.2 F (36.8 C)  TempSrc: Oral  SpO2: 98%  Weight: 223 lb (101.2 kg)  Height: 5' 10.5" (1.791 m)    Gen: Pleasant, elderly gentleman, in no distress,  normal affect  ENT: No lesions,  mouth clear,  oropharynx clear, no postnasal drip  Neck: No JVD, no stridor  Lungs: No use of accessory muscles, no crackles or wheezing on normal respiration, no wheeze on forced expiration  Cardiovascular: RRR, heart sounds normal, no murmur or gallops, no peripheral edema  Musculoskeletal: No deformities, no cyanosis or clubbing  Neuro: alert, awake, non focal, a bit slow to respond.   Skin: Warm, no lesions or rash       Assessment & Plan:  Pulmonary nodules/lesions, multiple He has minimal tobacco history but based on size criteria the 6 mm right lower lobe pulmonary nodule should be followed for interval stability.  A dedicated CT chest has been ordered for November and I agree with this.  I will review with him after it has been completed and then plan appropriate follow-up.  I did discuss with him the possible need to investigate further if there is interval change, including possible diagnostics like PET scan or bronchoscopy.  Baltazar Apo, MD, PhD 02/06/2020, 5:21 PM [facility] [phone] or if no answer [phone]

## 2020-02-06 NOTE — Patient Instructions (Signed)
Agree with repeat CT scan of the chest in November to follow small pulmonary nodules. Please follow with Dr. Lamonte Sakai in November after the CT so that we can review the results together.

## 2020-02-11 ENCOUNTER — Ambulatory Visit: Payer: Medicare Other | Admitting: Critical Care Medicine

## 2020-03-02 DIAGNOSIS — M1612 Unilateral primary osteoarthritis, left hip: Secondary | ICD-10-CM | POA: Diagnosis not present

## 2020-03-06 DIAGNOSIS — M1612 Unilateral primary osteoarthritis, left hip: Secondary | ICD-10-CM | POA: Diagnosis not present

## 2020-03-17 ENCOUNTER — Other Ambulatory Visit: Payer: Self-pay

## 2020-03-17 ENCOUNTER — Ambulatory Visit (INDEPENDENT_AMBULATORY_CARE_PROVIDER_SITE_OTHER): Payer: Medicare Other | Admitting: Family Medicine

## 2020-03-17 ENCOUNTER — Encounter: Payer: Self-pay | Admitting: Family Medicine

## 2020-03-17 VITALS — BP 138/76 | HR 91 | Temp 97.7°F | Wt 222.5 lb

## 2020-03-17 DIAGNOSIS — R931 Abnormal findings on diagnostic imaging of heart and coronary circulation: Secondary | ICD-10-CM | POA: Diagnosis not present

## 2020-03-17 DIAGNOSIS — R600 Localized edema: Secondary | ICD-10-CM

## 2020-03-17 MED ORDER — APIXABAN 5 MG PO TABS: ORAL_TABLET | ORAL | 0 refills | Status: DC

## 2020-03-17 MED ORDER — METHYLPREDNISOLONE ACETATE 80 MG/ML IJ SUSP: 80.0000 mg | Freq: Once | INTRAMUSCULAR | Status: DC

## 2020-03-17 NOTE — Patient Instructions (Signed)
We are setting up venous doppler to further assess  If unable to get tonight go ahead and start the Eliquis 5 mg - take two tablets times one dose and repeat again in morning.    Call 911 or go to ER for any chest pain of increased shortness of breath.

## 2020-03-17 NOTE — Progress Notes (Signed)
Established Patient Office Visit  Subjective:  Patient ID: Joe Davis, male    DOB: [birth year]  Age: 74 y.o. MRN: [id number]  CC:  Chief Complaint  Patient presents with  . Leg Swelling    left leg swollen for about 2 to 3 days no other symptoms     HPI Joe Davis presents for left lower extremity swelling and some pain.  His chronic problems include history of BPH, obesity, diastolic dysfunction, gout, hyperlipidemia.  He relates remote history of venous thrombosis and embolism back in 1979.  He is adopted so family history is unknown.  He states this past Sunday [place]  By that night he noticed some swelling left lower extremity it is persisted since then.  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  No chest pain.  No dyspnea.  No recent injury.  Current medications include Lipitor, HCTZ, Cialis.  Quit smoking way back in 1979.  Past Medical History:  Diagnosis Date  . Arthritis    "knees, hips" (10/10/2014)  . Basal cell carcinoma   . BPH (benign prostatic hyperplasia)   . Bradycardia vasovagal response 10 yrs ago   asymptomatic  . Diastolic dysfunction    Per echo in 2008  . DVT (deep venous thrombosis) ([place]) 1979   RLE  . Erectile dysfunction   . History of gout   . History of nuclear stress test    ETT-Myoview 3/17: Hypertensive blood pressure response to exercise, normal perfusion, EF 60%, low risk study  . Hypercholesterolemia    ? statin intolerant. May tolerate Lipitor  . Normal nuclear stress test May 2011  . Obesity   . Pulmonary embolism ([place]) 1979  . Syncope and collapse 8-10 yrs ago    Past Surgical History:  Procedure Laterality Date  . CARDIOVASCULAR STRESS TEST  May 2011   EF 73%; No ischemia  . COLONOSCOPY  2013  . JOINT REPLACEMENT    . KNEE ARTHROSCOPY Right 10/03/2012   Procedure: RIGHT KNEE ARTHROSCOPY WITH DEBRIDEMENT;  Surgeon: [name], MD;   Location: [facility];  Service: Orthopedics;  Laterality: Right;  WITH DEBRIDEMENT  . MOHS SURGERY  6 yrs    nose  . TOTAL HIP ARTHROPLASTY Right 10/06/2014    Family History  Adopted: Yes  Family history unknown: Yes    Social History   Socioeconomic History  . Marital status: Divorced    Spouse name: Not on file  . Number of children: Not on file  . Years of education: Not on file  . Highest education level: Not on file  Occupational History  . Not on file  Tobacco Use  . Smoking status: Former Smoker    Packs/day: 1.00    Years: 10.00    Pack years: 10.00    Types: Cigarettes    Quit date: 08/15/1977    Years since quitting: 42.6  . Smokeless tobacco: Never Used  Vaping Use  . Vaping Use: Never used  Substance and Sexual Activity  . Alcohol use: Yes    Alcohol/week: 7.0 standard drinks    Types: 7 Shots of liquor per week  . Drug use: No  . Sexual activity: Yes  Other Topics Concern  . Not on file  Social History Narrative  . Not on file   Social Determinants of Health   Financial Resource Strain:   . Difficulty of Paying Living Expenses:  Food Insecurity:   . Worried About Charity fundraiser in the Last Year:   . Arboriculturist in the Last Year:   Transportation Needs:   . Film/video editor (Medical):   Marland Kitchen Lack of Transportation (Non-Medical):   Physical Activity:   . Days of Exercise per Week:   . Minutes of Exercise per Session:   Stress:   . Feeling of Stress :   Social Connections:   . Frequency of Communication with Friends and Family:   . Frequency of Social Gatherings with Friends and Family:   . Attends Religious Services:   . Active Member of Clubs or Organizations:   . Attends Archivist Meetings:   Marland Kitchen Marital Status:   Intimate Partner Violence:   . Fear of Current or Ex-Partner:   . Emotionally Abused:   Marland Kitchen Physically Abused:   . Sexually Abused:     Outpatient Medications Prior to Visit  Medication Sig Dispense Refill    . aspirin 81 MG tablet Take 162 mg by mouth every morning.     Marland Kitchen atorvastatin (LIPITOR) 10 MG tablet Take 1 tablet (10 mg total) by mouth daily. Please call and schedule an appointment for further refills 1st attempt 90 tablet 0  . hydrochlorothiazide (HYDRODIURIL) 25 MG tablet Take 25 mg by mouth daily.    . Ibuprofen (ADVIL PO) Take by mouth.    . tadalafil (CIALIS) 5 MG tablet TK 1  T PO D UTD  10   No facility-administered medications prior to visit.    No Active Allergies  ROS Review of Systems  Constitutional: Negative for chills and fever.  Respiratory: Negative for cough and shortness of breath.   Cardiovascular: Positive for leg swelling. Negative for chest pain.  Neurological: Negative for dizziness and syncope.      Objective:    Physical Exam Vitals reviewed.  Constitutional:      Appearance: Normal appearance.  Cardiovascular:     Rate and Rhythm: Normal rate and regular rhythm.  Pulmonary:     Effort: Pulmonary effort is normal.     Breath sounds: Normal breath sounds.  Musculoskeletal:     Comments: He has prominent varicosities in both lower extremities.  He has some increased asymmetric edema left greater than right.  He has prominent sock line on both legs and has some 1+ pitting of both lower legs.  He does have some mild tenderness to palpation left posterior calf muscle region  Neurological:     Mental Status: He is alert.     BP 138/76 (BP Location: Left Arm, Patient Position: Sitting, Cuff Size: Normal)   Pulse 91   Temp 97.7 F (36.5 C) (Oral)   Wt 222 lb 8 oz (100.9 kg)   SpO2 94%   BMI 31.47 kg/m  Wt Readings from Last 3 Encounters:  03/17/20 222 lb 8 oz (100.9 kg)  02/06/20 223 lb (101.2 kg)  01/08/20 236 lb (107 kg)     Health Maintenance Due  Topic Date Due  . Hepatitis C Screening  Never done  . INFLUENZA VACCINE  03/15/2020    There are no preventive care reminders to display for this patient.  Lab Results  Component Value  Date   TSH 1.92 10/26/2015   Lab Results  Component Value Date   WBC 8.5 09/18/2019   HGB 15.5 09/18/2019   HCT 45.9 09/18/2019   MCV 91.1 09/18/2019   PLT 180 09/18/2019   Lab Results  Component Value Date   NA 139 09/18/2019   K 4.4 09/18/2019   CO2 24 09/18/2019   GLUCOSE 121 (H) 09/18/2019   BUN 18 09/18/2019   CREATININE 0.81 09/18/2019   BILITOT 0.5 01/03/2020   ALKPHOS 78 01/03/2020   AST 21 01/03/2020   ALT 18 01/03/2020   PROT 6.5 01/03/2020   ALBUMIN 4.1 01/03/2020   CALCIUM 9.2 09/18/2019   ANIONGAP 12 09/18/2019   GFR 91.78 07/28/2015   Lab Results  Component Value Date   CHOL 192 01/03/2020   Lab Results  Component Value Date   HDL 50 01/03/2020   Lab Results  Component Value Date   LDLCALC 129 (H) 01/03/2020   Lab Results  Component Value Date   TRIG 70 01/03/2020   Lab Results  Component Value Date   CHOLHDL 3.8 01/03/2020   Lab Results  Component Value Date   HGBA1C 5.9 07/28/2015      Assessment & Plan:   2-day history of asymmetric edema left leg greater than right along with some mild left calf pain.  Rule out acute DVT.  He has had previous DVT back in 2694 which certainly places him at higher risk.  Does not have any acute symptoms such as chest pain or increased dyspnea.  -We are in process of setting up Doppler.  We cannot get Doppler this afternoon late but will try to get promptly tomorrow  -We discussed go ahead and starting Eliquis 5 mg take 2 tablets (10 mg) this evening and then 2 tomorrow morning and then hopefully we can get this clarified sometime tomorrow with Doppler  -He knows to go immediately to the ER for any chest pain or increased shortness of breath  Meds ordered this encounter  Medications  . DISCONTD: methylPREDNISolone acetate (DEPO-MEDROL) injection 80 mg  . apixaban (ELIQUIS) 5 MG TABS tablet    Sig: Take two tablets (10 mg) po bid for 7 days and then one po bid.    Dispense:  60 tablet    Refill:  0      Pt would like to just pick up 4 tablets until we can clarify if he has DVT on doppler tomorrow.    Follow-up: No follow-ups on file.    Carolann Littler, MD

## 2020-03-18 ENCOUNTER — Ambulatory Visit (INDEPENDENT_AMBULATORY_CARE_PROVIDER_SITE_OTHER): Payer: Medicare Other | Admitting: Family Medicine

## 2020-03-18 ENCOUNTER — Encounter: Payer: Self-pay | Admitting: Family Medicine

## 2020-03-18 ENCOUNTER — Ambulatory Visit (HOSPITAL_COMMUNITY)
Admission: RE | Admit: 2020-03-18 | Discharge: 2020-03-18 | Disposition: A | Discharge status: Home / Self Care | Payer: Medicare Other | Source: Ambulatory Visit | Attending: Cardiovascular Disease | Admitting: Cardiovascular Disease

## 2020-03-18 DIAGNOSIS — R931 Abnormal findings on diagnostic imaging of heart and coronary circulation: Secondary | ICD-10-CM

## 2020-03-18 DIAGNOSIS — R600 Localized edema: Secondary | ICD-10-CM

## 2020-03-18 DIAGNOSIS — I824Z2 Acute embolism and thrombosis of unspecified deep veins of left distal lower extremity: Secondary | ICD-10-CM | POA: Insufficient documentation

## 2020-03-18 MED ORDER — APIXABAN 5 MG PO TABS: ORAL_TABLET | ORAL | 5 refills | Status: DC

## 2020-03-18 NOTE — Patient Instructions (Signed)

## 2020-03-18 NOTE — Progress Notes (Signed)
Established Patient Office Visit  Subjective:  Patient ID: Joe Davis, male    DOB: [birth year]  Age: 74 y.o. MRN: [id number]  CC:  Chief Complaint  Patient presents with  . Follow-up    pt is here to follow up on doppler results     HPI ROBET CRUTCHFIELD presents for follow-up from visit yesterday.  He came in with some left lower extremity swelling and we suspected possible DVT.  He did not have any dyspnea or chest pain and we set him up for a venous Doppler and started him on Eliquis 10 mg twice daily.  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  He had findings consistent with acute DVT involving the left distal common femoral vein, left femoral vein, left proximal profunda vein, left popliteal vein, left posterior tibial vein, left peroneal veins, and left gastrocnemius veins.  Symptomatically stable.  No chest pains.  No dyspnea.  Leg pain is unchanged and relatively mild.  Edema is unchanged.  He has not taken any further aspirin since starting the Eliquis  As noted in note yesterday he had reported PE back in 1979.  He is adopted so family history is unknown.  Past Medical History:  Diagnosis Date  . Arthritis    "knees, hips" (10/10/2014)  . Basal cell carcinoma   . BPH (benign prostatic hyperplasia)   . Bradycardia vasovagal response 10 yrs ago   asymptomatic  . Diastolic dysfunction    Per echo in 2008  . DVT (deep venous thrombosis) ([place]) 1979   RLE  . Erectile dysfunction   . History of gout   . History of nuclear stress test    ETT-Myoview 3/17: Hypertensive blood pressure response to exercise, normal perfusion, EF 60%, low risk study  . Hypercholesterolemia    ? statin intolerant. May tolerate Lipitor  . Normal nuclear stress test May 2011  . Obesity   . Pulmonary embolism ([place]) 1979  . Syncope and collapse 8-10 yrs ago    Past  Surgical History:  Procedure Laterality Date  . CARDIOVASCULAR STRESS TEST  May 2011   EF 73%; No ischemia  . COLONOSCOPY  2013  . JOINT REPLACEMENT    . KNEE ARTHROSCOPY Right 10/03/2012   Procedure: RIGHT KNEE ARTHROSCOPY WITH DEBRIDEMENT;  Surgeon: [name], MD;  Location: [facility];  Service: Orthopedics;  Laterality: Right;  WITH DEBRIDEMENT  . MOHS SURGERY  6 yrs    nose  . TOTAL HIP ARTHROPLASTY Right 10/06/2014    Family History  Adopted: Yes  Family history unknown: Yes    Social History   Socioeconomic History  . Marital status: Divorced    Spouse name: Not on file  . Number of children: Not on file  . Years of education: Not on file  . Highest education level: Not on file  Occupational History  . Not on file  Tobacco Use  . Smoking status: Former Smoker    Packs/day: 1.00    Years: 10.00    Pack years: 10.00    Types: Cigarettes    Quit date: 08/15/1977    Years since quitting: 42.6  . Smokeless tobacco: Never Used  Vaping Use  . Vaping Use: Never used  Substance and Sexual Activity  . Alcohol use: Yes    Alcohol/week: 7.0 standard drinks    Types: 7 Shots of liquor per week  .  Drug use: No  . Sexual activity: Yes  Other Topics Concern  . Not on file  Social History Narrative  . Not on file   Social Determinants of Health   Financial Resource Strain:   . Difficulty of Paying Living Expenses:   Food Insecurity:   . Worried About Charity fundraiser in the Last Year:   . Arboriculturist in the Last Year:   Transportation Needs:   . Film/video editor (Medical):   Marland Kitchen Lack of Transportation (Non-Medical):   Physical Activity:   . Days of Exercise per Week:   . Minutes of Exercise per Session:   Stress:   . Feeling of Stress :   Social Connections:   . Frequency of Communication with Friends and Family:   . Frequency of Social Gatherings with Friends and Family:   . Attends Religious Services:   . Active Member of Clubs or Organizations:     . Attends Archivist Meetings:   Marland Kitchen Marital Status:   Intimate Partner Violence:   . Fear of Current or Ex-Partner:   . Emotionally Abused:   Marland Kitchen Physically Abused:   . Sexually Abused:     Outpatient Medications Prior to Visit  Medication Sig Dispense Refill  . atorvastatin (LIPITOR) 10 MG tablet Take 1 tablet (10 mg total) by mouth daily. Please call and schedule an appointment for further refills 1st attempt 90 tablet 0  . hydrochlorothiazide (HYDRODIURIL) 25 MG tablet Take 25 mg by mouth daily.    . tadalafil (CIALIS) 5 MG tablet TK 1  T PO D UTD  10  . apixaban (ELIQUIS) 5 MG TABS tablet Take two tablets (10 mg) po bid for 7 days and then one po bid. 60 tablet 0  . aspirin 81 MG tablet Take 162 mg by mouth every morning.     . Ibuprofen (ADVIL PO) Take by mouth.     No facility-administered medications prior to visit.    No Known Allergies  ROS Review of Systems  Constitutional: Negative for chills and fever.  Respiratory: Negative for shortness of breath.   Cardiovascular: Negative for chest pain.  Gastrointestinal: Negative for abdominal pain.  Neurological: Negative for dizziness and headaches.  Hematological: Does not bruise/bleed easily.      Objective:    Physical Exam Vitals reviewed.  Constitutional:      Appearance: Normal appearance.  Cardiovascular:     Rate and Rhythm: Normal rate and regular rhythm.  Pulmonary:     Effort: Pulmonary effort is normal.     Breath sounds: Normal breath sounds.  Musculoskeletal:     Comments: He has edema in both lower extremities.  Minimal left calf tenderness.  Neurological:     Mental Status: He is alert.     BP 132/70 (BP Location: Left Arm, Patient Position: Sitting, Cuff Size: Normal)   Pulse 92   Temp 97.8 F (36.6 C) (Oral)   Wt 220 lb (99.8 kg)   SpO2 93%   BMI 31.12 kg/m  Wt Readings from Last 3 Encounters:  03/18/20 220 lb (99.8 kg)  03/17/20 222 lb 8 oz (100.9 kg)  02/06/20 223 lb  (101.2 kg)     Health Maintenance Due  Topic Date Due  . Hepatitis C Screening  Never done  . INFLUENZA VACCINE  03/15/2020    There are no preventive care reminders to display for this patient.  Lab Results  Component Value Date   TSH 1.92 10/26/2015  Lab Results  Component Value Date   WBC 8.5 09/18/2019   HGB 15.5 09/18/2019   HCT 45.9 09/18/2019   MCV 91.1 09/18/2019   PLT 180 09/18/2019   Lab Results  Component Value Date   NA 139 09/18/2019   K 4.4 09/18/2019   CO2 24 09/18/2019   GLUCOSE 121 (H) 09/18/2019   BUN 18 09/18/2019   CREATININE 0.81 09/18/2019   BILITOT 0.5 01/03/2020   ALKPHOS 78 01/03/2020   AST 21 01/03/2020   ALT 18 01/03/2020   PROT 6.5 01/03/2020   ALBUMIN 4.1 01/03/2020   CALCIUM 9.2 09/18/2019   ANIONGAP 12 09/18/2019   GFR 91.78 07/28/2015   Lab Results  Component Value Date   CHOL 192 01/03/2020   Lab Results  Component Value Date   HDL 50 01/03/2020   Lab Results  Component Value Date   LDLCALC 129 (H) 01/03/2020   Lab Results  Component Value Date   TRIG 70 01/03/2020   Lab Results  Component Value Date   CHOLHDL 3.8 01/03/2020   Lab Results  Component Value Date   HGBA1C 5.9 07/28/2015      Assessment & Plan:   Acute DVT left lower extremity with 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  Patient also reports history of pulmonary embolus 1979.  He does not have any red flags such as dyspnea or chest pain  -Continue Eliquis 5 mg 2 tablets twice daily for 7 days and then reduce to 1 tablet twice daily -He will likely need to be on indefinite anticoagulation with the finding of chronic DVT right lower extremity, remote history of PE, and now acute left lower extremity DVT -Follow-up immediately for any chest pains or shortness of breath -Elevate leg frequently -Sent in prescription for the Eliquis.  We had provided samples yesterday to start on until this was further clarified -Recommend office follow-up  in 3 to 4 weeks to reassess.  Check CBC and basic metabolic panel then  Meds ordered this encounter  Medications  . apixaban (ELIQUIS) 5 MG TABS tablet    Sig: Take two tablets (10 mg) twice daily for 7 days and then decrease to one tablet twice daily    Dispense:  60 tablet    Refill:  5    Follow-up: Return in about 1 month (around 04/18/2020).    Carolann Littler, MD

## 2020-03-19 ENCOUNTER — Telehealth: Payer: Self-pay | Admitting: Family Medicine

## 2020-03-19 NOTE — Telephone Encounter (Signed)
[facility] called stating pt insurance does not cover Eliquis and the insurance recommends to change to Xarelto instead. Please call [facility] [phone].

## 2020-03-19 NOTE — Telephone Encounter (Signed)
error 

## 2020-03-20 MED ORDER — RIVAROXABAN 15 MG PO TABS: 15.0000 mg | ORAL_TABLET | Freq: Two times a day (BID) | ORAL | 0 refills | Status: DC

## 2020-03-20 MED ORDER — RIVAROXABAN 20 MG PO TABS: 20.0000 mg | ORAL_TABLET | Freq: Every day | ORAL | 1 refills | Status: DC

## 2020-03-20 NOTE — Addendum Note (Signed)
Addended by: Modena Morrow R on: 03/20/2020 09:03 AM   Modules accepted: Orders

## 2020-03-20 NOTE — Telephone Encounter (Signed)
Pt asked about his medication Eliquis. Informed pt of the messages below and what [facility] has decided. Pt is confused and would clarity on why with Eliquis he went down in dosage but with the Xarelto he is going up in dosage?   Pt can be reached at [phone]

## 2020-03-20 NOTE — Telephone Encounter (Signed)
Left detailed message for pharmacy letting them know we sent in [place] instead

## 2020-03-20 NOTE — Telephone Encounter (Signed)
Start Xarelto 15 mg po bid for 7 days and then change to Xarelto 20 mg po qd

## 2020-03-20 NOTE — Telephone Encounter (Signed)
I notified pt that I am waiting till pharmacy opens to be able to call this in and that xarelto has different dosing

## 2020-03-20 NOTE — Telephone Encounter (Signed)
Please advise 

## 2020-04-03 ENCOUNTER — Ambulatory Visit (INDEPENDENT_AMBULATORY_CARE_PROVIDER_SITE_OTHER): Payer: Medicare Other | Admitting: Family Medicine

## 2020-04-03 ENCOUNTER — Other Ambulatory Visit: Payer: Self-pay

## 2020-04-03 ENCOUNTER — Encounter: Payer: Self-pay | Admitting: Family Medicine

## 2020-04-03 ENCOUNTER — Telehealth: Payer: Self-pay | Admitting: Family Medicine

## 2020-04-03 VITALS — BP 130/76 | HR 76 | Temp 98.4°F | Wt 222.2 lb

## 2020-04-03 DIAGNOSIS — R931 Abnormal findings on diagnostic imaging of heart and coronary circulation: Secondary | ICD-10-CM | POA: Diagnosis not present

## 2020-04-03 DIAGNOSIS — I824Z2 Acute embolism and thrombosis of unspecified deep veins of left distal lower extremity: Secondary | ICD-10-CM | POA: Diagnosis not present

## 2020-04-03 MED ORDER — RIVAROXABAN 20 MG PO TABS: 20.0000 mg | ORAL_TABLET | Freq: Every day | ORAL | 3 refills | Status: DC

## 2020-04-03 MED ORDER — APIXABAN 5 MG PO TABS: 5.0000 mg | ORAL_TABLET | Freq: Two times a day (BID) | ORAL | 1 refills | Status: DC

## 2020-04-03 NOTE — Telephone Encounter (Signed)
Pt wife called to say they are going with Xarelto. [facility] pharmacy will send a fax asking for the prescription.  [phone] number for [employer]  Please leave a text message for the pt when he has spoken with [facility]

## 2020-04-03 NOTE — Progress Notes (Signed)
Established Patient Office Visit  Subjective:  Patient ID: Joe Davis, male    DOB: 05-05-1946  Age: 74 y.o. MRN: [id number]  CC:  Chief Complaint  Patient presents with  . Follow-up    pt states he is feeling better     HPI [name] presents for follow-up of recently diagnosed DVT.  He was concerned because last week 1 day he thought his left lower extremity looked slightly "bluish "compared to the right.  He has not noted any pain.  No dyspnea.  He is currently on Eliquis 5 mg twice daily.  He is wanting to shop around to see about cost issues and is requesting printed prescription for Xarelto 20 mg daily versus Eliquis 5 mg twice daily.  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  We explained he would likely need chronic anticoagulation.  He is non-smoker.  He had been doing some prolonged driving recently but no other provoking factors.  No hormone use.  Past Medical History:  Diagnosis Date  . Arthritis    "knees, hips" (10/10/2014)  . Basal cell carcinoma   . BPH (benign prostatic hyperplasia)   . Bradycardia vasovagal response 10 yrs ago   asymptomatic  . Diastolic dysfunction    Per echo in 2008  . 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 Erectile dysfunction   . History of gout   . History of nuclear stress test    ETT-Myoview 3/17: Hypertensive blood pressure response to exercise, normal perfusion, EF 60%, low risk study  . Hypercholesterolemia    ? statin intolerant. May tolerate Lipitor  . Normal nuclear stress test May 2011  . Obesity   . Pulmonary embolism ([place]) 1979  . Syncope and collapse 8-10 yrs ago    Past Surgical History:  Procedure Laterality Date  . CARDIOVASCULAR STRESS TEST  May 2011   EF 73%; No ischemia  . COLONOSCOPY  2013  . JOINT REPLACEMENT    . KNEE ARTHROSCOPY Right 10/03/2012   Procedure: RIGHT KNEE ARTHROSCOPY WITH DEBRIDEMENT;  Surgeon:  [name], MD;  Location: [facility];  Service: Orthopedics;  Laterality: Right;  WITH DEBRIDEMENT  . MOHS SURGERY  6 yrs    nose  . TOTAL HIP ARTHROPLASTY Right 10/06/2014    Family History  Adopted: Yes  Family history unknown: Yes    Social History   Socioeconomic History  . Marital status: Divorced    Spouse name: Not on file  . Number of children: Not on file  . Years of education: Not on file  . Highest education level: Not on file  Occupational History  . Not on file  Tobacco Use  . Smoking status: Former Smoker    Packs/day: 1.00    Years: 10.00    Pack years: 10.00    Types: Cigarettes    Quit date: 08/15/1977    Years since quitting: 42.6  . Smokeless tobacco: Never Used  Vaping Use  . Vaping Use: Never used  Substance and Sexual Activity  . Alcohol use: Yes    Alcohol/week: 7.0 standard drinks    Types: 7 Shots of liquor per week  . Drug use: No  . Sexual activity: Yes  Other Topics Concern  . Not on file  Social History Narrative  . Not on file   Social Determinants of Health   Financial Resource Strain:   .  Difficulty of Paying Living Expenses: Not on file  Food Insecurity:   . Worried About Charity fundraiser in the Last Year: Not on file  . Ran Out of Food in the Last Year: Not on file  Transportation Needs:   . Lack of Transportation (Medical): Not on file  . Lack of Transportation (Non-Medical): Not on file  Physical Activity:   . Days of Exercise per Week: Not on file  . Minutes of Exercise per Session: Not on file  Stress:   . Feeling of Stress : Not on file  Social Connections:   . Frequency of Communication with Friends and Family: Not on file  . Frequency of Social Gatherings with Friends and Family: Not on file  . Attends Religious Services: Not on file  . Active Member of Clubs or Organizations: Not on file  . Attends Archivist Meetings: Not on file  . Marital Status: Not on file  Intimate Partner Violence:   . Fear  of Current or Ex-Partner: Not on file  . Emotionally Abused: Not on file  . Physically Abused: Not on file  . Sexually Abused: Not on file    Outpatient Medications Prior to Visit  Medication Sig Dispense Refill  . atorvastatin (LIPITOR) 10 MG tablet Take 1 tablet (10 mg total) by mouth daily. Please call and schedule an appointment for further refills 1st attempt 90 tablet 0  . hydrochlorothiazide (HYDRODIURIL) 25 MG tablet Take 25 mg by mouth daily.    . tadalafil (CIALIS) 5 MG tablet TK 1  T PO D UTD  10  . Rivaroxaban (XARELTO) 15 MG TABS tablet Take 1 tablet (15 mg total) by mouth 2 (two) times daily with a meal. For seven days then switch to 20mg  14 tablet 0  . rivaroxaban (XARELTO) 20 MG TABS tablet Take 1 tablet (20 mg total) by mouth daily with supper. 90 tablet 1   No facility-administered medications prior to visit.    No Known Allergies  ROS Review of Systems  Constitutional: Negative for chills, fatigue and fever.  Eyes: Negative for visual disturbance.  Respiratory: Negative for cough, chest tightness and shortness of breath.   Cardiovascular: Positive for leg swelling. Negative for chest pain and palpitations.  Endocrine: Negative for polydipsia.  Genitourinary: Negative for enuresis.  Neurological: Negative for dizziness, syncope, weakness, light-headedness and headaches.      Objective:    Physical Exam Vitals reviewed.  Constitutional:      Appearance: Normal appearance.  Cardiovascular:     Rate and Rhythm: Normal rate and regular rhythm.  Pulmonary:     Effort: Pulmonary effort is normal.     Breath sounds: Normal breath sounds.  Musculoskeletal:     Comments: Has some mild edema left lower extremity compared to right.  Calf is nontender to palpation.  He has excellent capillary refill in both feet are warm to touch with good distal pulses  Neurological:     Mental Status: He is alert.     BP 130/76 (BP Location: Left Arm, Patient Position:  Sitting, Cuff Size: Normal)   Pulse 76   Temp 98.4 F (36.9 C) (Oral)   Wt 222 lb 3.2 oz (100.8 kg)   SpO2 95%   BMI 31.43 kg/m  Wt Readings from Last 3 Encounters:  04/03/20 222 lb 3.2 oz (100.8 kg)  03/18/20 220 lb (99.8 kg)  03/17/20 222 lb 8 oz (100.9 kg)     Health Maintenance Due  Topic Date  Due  . Hepatitis C Screening  Never done  . INFLUENZA VACCINE  03/15/2020    There are no preventive care reminders to display for this patient.  Lab Results  Component Value Date   TSH 1.92 10/26/2015   Lab Results  Component Value Date   WBC 8.5 09/18/2019   HGB 15.5 09/18/2019   HCT 45.9 09/18/2019   MCV 91.1 09/18/2019   PLT 180 09/18/2019   Lab Results  Component Value Date   NA 139 09/18/2019   K 4.4 09/18/2019   CO2 24 09/18/2019   GLUCOSE 121 (H) 09/18/2019   BUN 18 09/18/2019   CREATININE 0.81 09/18/2019   BILITOT 0.5 01/03/2020   ALKPHOS 78 01/03/2020   AST 21 01/03/2020   ALT 18 01/03/2020   PROT 6.5 01/03/2020   ALBUMIN 4.1 01/03/2020   CALCIUM 9.2 09/18/2019   ANIONGAP 12 09/18/2019   GFR 91.78 07/28/2015   Lab Results  Component Value Date   CHOL 192 01/03/2020   Lab Results  Component Value Date   HDL 50 01/03/2020   Lab Results  Component Value Date   LDLCALC 129 (H) 01/03/2020   Lab Results  Component Value Date   TRIG 70 01/03/2020   Lab Results  Component Value Date   CHOLHDL 3.8 01/03/2020   Lab Results  Component Value Date   HGBA1C 5.9 07/28/2015      Assessment & Plan:   Recent DVT left lower extremity.  This makes second occurrence with last decades ago. Symptomatically stable with no leg pain and no dyspnea  -Patient requesting written prescription both for Eliquis and Xarelto to compare cost.  He will get back with [place] after he figures out which when he can afford.  We did discuss option of Coumadin but he would like to avoid Coumadin if possible  -Suggested knee-high venous compression hose 20 to 30 mm with  prescription written  -Recommend 56-month follow-up.  Consider follow-up Dopplers then.  Consider transition to low-dose prophylactic medication at that point  Meds ordered this encounter  Medications  . rivaroxaban (XARELTO) 20 MG TABS tablet    Sig: Take 1 tablet (20 mg total) by mouth daily with supper.    Dispense:  90 tablet    Refill:  3  . apixaban (ELIQUIS) 5 MG TABS tablet    Sig: Take 1 tablet (5 mg total) by mouth 2 (two) times daily.    Dispense:  180 tablet    Refill:  1    Follow-up: Return in about 3 months (around 07/04/2020).    Carolann Littler, MD

## 2020-04-03 NOTE — Telephone Encounter (Signed)
Pt has be informed

## 2020-04-03 NOTE — Addendum Note (Signed)
Addended by: Modena Morrow R on: 04/03/2020 03:24 PM   Modules accepted: Orders

## 2020-04-03 NOTE — Patient Instructions (Signed)

## 2020-04-03 NOTE — Telephone Encounter (Signed)
Medication sent in unable to leave voicemail on phone

## 2020-04-07 ENCOUNTER — Telehealth: Payer: Self-pay | Admitting: Family Medicine

## 2020-04-07 NOTE — Telephone Encounter (Signed)
OK to give if we have samples of Eliquis 5 mg po bid.

## 2020-04-07 NOTE — Telephone Encounter (Signed)
Please advise 

## 2020-04-07 NOTE — Telephone Encounter (Signed)
Pts spouse is calling in stating that the pt is out of Rx apixaban [facility]) and will not be getting his medication for another 6-7 days and would like to know if we have any samples until he get his medication.  I have checked with Apolonio Schneiders and she is going to look and let me know.

## 2020-04-07 NOTE — Telephone Encounter (Signed)
Please advise if there is samples

## 2020-04-08 MED ORDER — APIXABAN 5 MG PO TABS
5.0000 mg | ORAL_TABLET | Freq: Two times a day (BID) | ORAL | 0 refills | Status: DC
Start: 2020-04-08 — End: 2020-05-13

## 2020-04-08 NOTE — Telephone Encounter (Signed)
Samples available, documented, patient is aware.

## 2020-04-13 ENCOUNTER — Ambulatory Visit: Payer: Medicare Other | Admitting: Family Medicine

## 2020-04-14 ENCOUNTER — Ambulatory Visit (INDEPENDENT_AMBULATORY_CARE_PROVIDER_SITE_OTHER): Payer: Medicare Other | Admitting: Family Medicine

## 2020-04-14 ENCOUNTER — Encounter: Payer: Self-pay | Admitting: Family Medicine

## 2020-04-14 ENCOUNTER — Other Ambulatory Visit: Payer: Self-pay

## 2020-04-14 VITALS — BP 120/80 | HR 75 | Temp 98.0°F | Ht 70.5 in | Wt 224.0 lb

## 2020-04-14 DIAGNOSIS — M25552 Pain in left hip: Secondary | ICD-10-CM

## 2020-04-14 DIAGNOSIS — I824Z2 Acute embolism and thrombosis of unspecified deep veins of left distal lower extremity: Secondary | ICD-10-CM

## 2020-04-14 DIAGNOSIS — R931 Abnormal findings on diagnostic imaging of heart and coronary circulation: Secondary | ICD-10-CM

## 2020-04-14 MED ORDER — TRAMADOL HCL 50 MG PO TABS
50.0000 mg | ORAL_TABLET | Freq: Four times a day (QID) | ORAL | 0 refills | Status: AC | PRN
Start: 2020-04-14 — End: 2020-04-19

## 2020-04-14 NOTE — Progress Notes (Signed)
Established Patient Office Visit  Subjective:  Patient ID: Joe Davis, male    DOB: 1945/08/27  Age: 74 y.o. MRN: [id number]  CC:  Chief Complaint  Patient presents with  . Leg Pain    hx dvt    HPI Joe Davis presents for left anterior hip pain which started couple days ago.  No injury.  He has known osteoarthritis of both hips.  Has had previous right total hip replacement.  He is scheduled for left total hip replacement next February.  He has done a little bit more ambulation past couple days.  Recent history is that he was diagnosed with acute DVT left lower extremity several weeks ago.  Is on Eliquis 5 mg twice daily and taking regularly.  Has not noted any increased edema in the left lower extremity and if anything this is down some.  He describes sharp pain somewhat intermittent left anterior hip worse with hip flexion.  Some pain radiating down toward the knee.  Denies any low back pain.  No lower abdominal pain.  No significant numbness.  Possibly some weakness.  He took some Tylenol without relief.  Avoids non-steroidals.  Pain is moderate to severe at times.  He is not describing any claudication type symptoms.  Past Medical History:  Diagnosis Date  . Arthritis    "knees, hips" (10/10/2014)  . Basal cell carcinoma   . BPH (benign prostatic hyperplasia)   . Bradycardia vasovagal response 10 yrs ago   asymptomatic  . Diastolic dysfunction    Per echo in 2008  . DVT (deep venous thrombosis) ([place]) 1979   RLE  . Erectile dysfunction   . History of gout   . History of nuclear stress test    ETT-Myoview 3/17: Hypertensive blood pressure response to exercise, normal perfusion, EF 60%, low risk study  . Hypercholesterolemia    ? statin intolerant. May tolerate Lipitor  . Normal nuclear stress test May 2011  . Obesity   . Pulmonary embolism ([place]) 1979  . Syncope and collapse 8-10 yrs ago    Past Surgical History:  Procedure Laterality Date  . CARDIOVASCULAR STRESS  TEST  May 2011   EF 73%; No ischemia  . COLONOSCOPY  2013  . JOINT REPLACEMENT    . KNEE ARTHROSCOPY Right 10/03/2012   Procedure: RIGHT KNEE ARTHROSCOPY WITH DEBRIDEMENT;  Surgeon: [name], MD;  Location: [facility];  Service: Orthopedics;  Laterality: Right;  WITH DEBRIDEMENT  . MOHS SURGERY  6 yrs    nose  . TOTAL HIP ARTHROPLASTY Right 10/06/2014    Family History  Adopted: Yes  Family history unknown: Yes    Social History   Socioeconomic History  . Marital status: Divorced    Spouse name: Not on file  . Number of children: Not on file  . Years of education: Not on file  . Highest education level: Not on file  Occupational History  . Not on file  Tobacco Use  . Smoking status: Former Smoker    Packs/day: 1.00    Years: 10.00    Pack years: 10.00    Types: Cigarettes    Quit date: 08/15/1977    Years since quitting: 42.6  . Smokeless tobacco: Never Used  Vaping Use  . Vaping Use: Never used  Substance and Sexual Activity  . Alcohol use: Yes    Alcohol/week: 7.0 standard drinks    Types: 7 Shots of liquor per week  . Drug use: No  .  Sexual activity: Yes  Other Topics Concern  . Not on file  Social History Narrative  . Not on file   Social Determinants of Health   Financial Resource Strain:   . Difficulty of Paying Living Expenses: Not on file  Food Insecurity:   . Worried About Charity fundraiser in the Last Year: Not on file  . Ran Out of Food in the Last Year: Not on file  Transportation Needs:   . Lack of Transportation (Medical): Not on file  . Lack of Transportation (Non-Medical): Not on file  Physical Activity:   . Days of Exercise per Week: Not on file  . Minutes of Exercise per Session: Not on file  Stress:   . Feeling of Stress : Not on file  Social Connections:   . Frequency of Communication with Friends and Family: Not on file  . Frequency of Social Gatherings with Friends and Family: Not on file  . Attends Religious Services: Not on  file  . Active Member of Clubs or Organizations: Not on file  . Attends Archivist Meetings: Not on file  . Marital Status: Not on file  Intimate Partner Violence:   . Fear of Current or Ex-Partner: Not on file  . Emotionally Abused: Not on file  . Physically Abused: Not on file  . Sexually Abused: Not on file    Outpatient Medications Prior to Visit  Medication Sig Dispense Refill  . apixaban (ELIQUIS) 5 MG TABS tablet Take 1 tablet (5 mg total) by mouth 2 (two) times daily. 28 tablet 0  . atorvastatin (LIPITOR) 10 MG tablet Take 1 tablet (10 mg total) by mouth daily. Please call and schedule an appointment for further refills 1st attempt 90 tablet 0  . hydrochlorothiazide (HYDRODIURIL) 25 MG tablet Take 25 mg by mouth daily.    . tadalafil (CIALIS) 5 MG tablet TK 1  T PO D UTD  10  . rivaroxaban (XARELTO) 20 MG TABS tablet Take 1 tablet (20 mg total) by mouth daily with supper. 90 tablet 3   No facility-administered medications prior to visit.    No Known Allergies  ROS Review of Systems  Respiratory: Negative for shortness of breath.   Cardiovascular: Negative for chest pain.  Gastrointestinal: Negative for abdominal pain.  Neurological: Positive for weakness. Negative for numbness.      Objective:    Physical Exam Vitals reviewed.  Constitutional:      Appearance: Normal appearance.  Cardiovascular:     Rate and Rhythm: Normal rate and regular rhythm.     Comments: Left and right feet are warm to touch with excellent dorsalis pedis and posterior tibial pulses.  Excellent capillary refill. Pulmonary:     Effort: Pulmonary effort is normal.     Breath sounds: Normal breath sounds.  Musculoskeletal:     Comments: He has fairly good range of motion right hip.  Left reveals very restricted range of motion.  He has significant pain with minimal internal or external rotation.    Neurological:     Mental Status: He is alert.     Comments: Deep tendon reflexes  are 1+ ankle and 1+ knee bilaterally.  He has difficulties with left hip flexion secondary to pain.  Full strength with plantarflexion and dorsiflexion bilaterally.     BP 120/80   Pulse 75   Temp 98 F (36.7 C)   Ht 5' 10.5" (1.791 m)   Wt 224 lb (101.6 kg)   SpO2 98%  BMI 31.69 kg/m  Wt Readings from Last 3 Encounters:  04/14/20 224 lb (101.6 kg)  04/03/20 222 lb 3.2 oz (100.8 kg)  03/18/20 220 lb (99.8 kg)     Health Maintenance Due  Topic Date Due  . Hepatitis C Screening  Never done  . INFLUENZA VACCINE  03/15/2020    There are no preventive care reminders to display for this patient.  Lab Results  Component Value Date   TSH 1.92 10/26/2015   Lab Results  Component Value Date   WBC 8.5 09/18/2019   HGB 15.5 09/18/2019   HCT 45.9 09/18/2019   MCV 91.1 09/18/2019   PLT 180 09/18/2019   Lab Results  Component Value Date   NA 139 09/18/2019   K 4.4 09/18/2019   CO2 24 09/18/2019   GLUCOSE 121 (H) 09/18/2019   BUN 18 09/18/2019   CREATININE 0.81 09/18/2019   BILITOT 0.5 01/03/2020   ALKPHOS 78 01/03/2020   AST 21 01/03/2020   ALT 18 01/03/2020   PROT 6.5 01/03/2020   ALBUMIN 4.1 01/03/2020   CALCIUM 9.2 09/18/2019   ANIONGAP 12 09/18/2019   GFR 91.78 07/28/2015   Lab Results  Component Value Date   CHOL 192 01/03/2020   Lab Results  Component Value Date   HDL 50 01/03/2020   Lab Results  Component Value Date   LDLCALC 129 (H) 01/03/2020   Lab Results  Component Value Date   TRIG 70 01/03/2020   Lab Results  Component Value Date   CHOLHDL 3.8 01/03/2020   Lab Results  Component Value Date   HGBA1C 5.9 07/28/2015      Assessment & Plan:   #1 left anterior hip pain.  Suspect related to severe degenerative osteoarthritis.  We explained we did not think this was related to his recent DVT.  He has been consistently on Eliquis 5 mg twice daily without skipping any doses.  He does not have any clinical evidence for increased swelling left  lower extremity.  He is not describing typical radiculitis type pain  -Prescription for tramadol 50 mg 1 every 6 hours as needed for severe pain and follow-up promptly with his orthopedic surgeon -Watch for any weakness, progressive numbness, or any other new symptoms  Meds ordered this encounter  Medications  . traMADol (ULTRAM) 50 MG tablet    Sig: Take 1 tablet (50 mg total) by mouth every 6 (six) hours as needed for up to 5 days.    Dispense:  30 tablet    Refill:  0    Follow-up: No follow-ups on file.    Carolann Littler, MD

## 2020-04-14 NOTE — Patient Instructions (Signed)
Suspect pain coming from the severe arthritis in the hip  Try the Tramadol and can cause some mild sedation    Follow up with orthopedic surgeon if pain persists.

## 2020-04-30 ENCOUNTER — Telehealth: Payer: Self-pay | Admitting: Family Medicine

## 2020-04-30 NOTE — Telephone Encounter (Signed)
Spoke to pt and advised that Dr.Burchette is not in. Based on last note pt was suppose to call to see which is cheaper the Xarelto or Eliquis after shopping around. Pt stated he would like to start Xarelto and has taking his last dose of Eliquis. I advised pt that he may need to call back tomorrow for Dr.Burchette's advise on how and when to take [facility] after stopping Eliquis. Pt spouse stated they will call the pharmacy to ask a pharmacist.

## 2020-04-30 NOTE — Telephone Encounter (Signed)
Eliquis 2 x daily, taking last tablet today.  Transitioning to [place] 1 time daily.  Pt wanting a call to know how to transition from 2 x daily on Eliquis to to 1 time daily for Xyrelto.  Pt is requesting the call back for today.

## 2020-05-01 NOTE — Telephone Encounter (Signed)
He simply stops the Eliquis and very next day starts Xarelto 20 mg once daily.

## 2020-05-01 NOTE — Telephone Encounter (Signed)
Tried to call pt with update no answer and no vm set up

## 2020-05-04 DIAGNOSIS — S81812A Laceration without foreign body, left lower leg, initial encounter: Secondary | ICD-10-CM | POA: Diagnosis not present

## 2020-05-11 DIAGNOSIS — Z23 Encounter for immunization: Secondary | ICD-10-CM | POA: Diagnosis not present

## 2020-05-13 ENCOUNTER — Telehealth: Payer: Self-pay | Admitting: Family Medicine

## 2020-05-13 ENCOUNTER — Other Ambulatory Visit: Payer: Self-pay

## 2020-05-13 MED ORDER — RIVAROXABAN 20 MG PO TABS: 20.0000 mg | ORAL_TABLET | Freq: Every day | ORAL | 0 refills | Status: DC

## 2020-05-13 NOTE — Telephone Encounter (Signed)
pt is requesting medication for Eliquis to be sent to the pharmacy [facility] at [address] [place], [place] [address]  [phone] . pt lost his RX while traveling need a  temporary supply  please call wife when this is done she needs to know when to pick up   [phone]

## 2020-05-13 NOTE — Telephone Encounter (Signed)
Patients wife called back and they have been waiting at the pharmacy for 2 hours and I confirmed that the patient is taking Xarelto 20 mg daily and has been for 5 weeks. Eliquis has been deleted from current med list.  Dr. Elease Hashimoto gave me the verbal OK to send 5 pills to the requested pharmacy until they return home.   Wife verbalized an understanding.

## 2020-05-16 DIAGNOSIS — S81812D Laceration without foreign body, left lower leg, subsequent encounter: Secondary | ICD-10-CM | POA: Diagnosis not present

## 2020-05-16 DIAGNOSIS — Z4802 Encounter for removal of sutures: Secondary | ICD-10-CM | POA: Diagnosis not present

## 2020-05-18 DIAGNOSIS — M1612 Unilateral primary osteoarthritis, left hip: Secondary | ICD-10-CM | POA: Diagnosis not present

## 2020-06-02 DIAGNOSIS — H401332 Pigmentary glaucoma, bilateral, moderate stage: Secondary | ICD-10-CM | POA: Diagnosis not present

## 2020-06-15 ENCOUNTER — Ambulatory Visit
Admission: RE | Admit: 2020-06-15 | Discharge: 2020-06-15 | Disposition: A | Discharge status: Home / Self Care | Payer: Medicare Other | Source: Ambulatory Visit | Attending: Cardiovascular Disease | Admitting: Cardiovascular Disease

## 2020-06-15 ENCOUNTER — Telehealth: Payer: Self-pay | Admitting: Family Medicine

## 2020-06-15 ENCOUNTER — Other Ambulatory Visit: Payer: Self-pay

## 2020-06-15 DIAGNOSIS — R0600 Dyspnea, unspecified: Secondary | ICD-10-CM

## 2020-06-15 DIAGNOSIS — J9811 Atelectasis: Secondary | ICD-10-CM | POA: Diagnosis not present

## 2020-06-15 DIAGNOSIS — I2510 Atherosclerotic heart disease of native coronary artery without angina pectoris: Secondary | ICD-10-CM | POA: Diagnosis not present

## 2020-06-15 DIAGNOSIS — R918 Other nonspecific abnormal finding of lung field: Secondary | ICD-10-CM

## 2020-06-15 DIAGNOSIS — I700 Atherosclerosis of aorta: Secondary | ICD-10-CM | POA: Diagnosis not present

## 2020-06-15 DIAGNOSIS — R0602 Shortness of breath: Secondary | ICD-10-CM

## 2020-06-15 DIAGNOSIS — J984 Other disorders of lung: Secondary | ICD-10-CM | POA: Diagnosis not present

## 2020-06-15 DIAGNOSIS — R079 Chest pain, unspecified: Secondary | ICD-10-CM

## 2020-06-15 DIAGNOSIS — R911 Solitary pulmonary nodule: Secondary | ICD-10-CM

## 2020-06-15 DIAGNOSIS — Z87891 Personal history of nicotine dependence: Secondary | ICD-10-CM

## 2020-06-15 NOTE — Telephone Encounter (Signed)
Left message for patient to call back and schedule Medicare Annual Wellness Visit (AWV) either virtually or in office.  Last AWV 01/22/2015   Please schedule at anytime with [facility] Nurse Health Advisor 1. This should be a 45 minute visit.

## 2020-06-17 ENCOUNTER — Encounter: Payer: Self-pay | Admitting: Family Medicine

## 2020-06-17 DIAGNOSIS — Z86718 Personal history of other venous thrombosis and embolism: Secondary | ICD-10-CM

## 2020-06-17 NOTE — Telephone Encounter (Signed)
I have ordered repeat venous doppler of lower extremity.

## 2020-06-22 ENCOUNTER — Ambulatory Visit (HOSPITAL_COMMUNITY)
Admission: RE | Admit: 2020-06-22 | Discharge: 2020-06-22 | Disposition: A | Discharge status: Home / Self Care | Payer: Medicare Other | Source: Ambulatory Visit | Attending: Family Medicine | Admitting: Family Medicine

## 2020-06-22 ENCOUNTER — Other Ambulatory Visit: Payer: Self-pay

## 2020-06-22 DIAGNOSIS — Z86718 Personal history of other venous thrombosis and embolism: Secondary | ICD-10-CM | POA: Diagnosis not present

## 2020-07-15 DIAGNOSIS — R918 Other nonspecific abnormal finding of lung field: Secondary | ICD-10-CM

## 2020-07-16 NOTE — Telephone Encounter (Signed)
Dr Lamonte Sakai, please advise on email from the pt's spouse  Hi Dr. Lamonte Sakai.  Will you please take a look at Joe's chest CT results?  We were  told by [name] that it was normal, however there are some findings which we are unsure of (ground glass appearance and three vessel atherosclerosis etc.)  Would you please let us know if there is anything you are concerned about? Joe is still mildly DOE.  Thank You, [name] and Sherrilee Gilles.  DOB 09/07/1945

## 2020-07-17 ENCOUNTER — Ambulatory Visit (INDEPENDENT_AMBULATORY_CARE_PROVIDER_SITE_OTHER): Payer: Medicare Other | Admitting: *Deleted

## 2020-07-17 ENCOUNTER — Other Ambulatory Visit: Payer: Self-pay

## 2020-07-17 DIAGNOSIS — Z23 Encounter for immunization: Secondary | ICD-10-CM

## 2020-07-20 NOTE — Telephone Encounter (Signed)
Please let them know that I reviewed his CT scan of the chest.  The hazy area that we were following based on his prior CT scan is still present, looks less nodular.  This is good news and may simply reflect some inflammatory change.  I believe we should continue to follow to ensure that there is no important change or more nodular appearance in the future that would necessitate more in-depth evaluation.  I would like for him to have a repeat CT chest in 1 year to compare with this 1, no contrast, to follow groundglass opacity/nodule.  Overall good news

## 2020-08-10 ENCOUNTER — Telehealth: Payer: Self-pay | Admitting: Family Medicine

## 2020-08-10 DIAGNOSIS — Z86718 Personal history of other venous thrombosis and embolism: Secondary | ICD-10-CM

## 2020-08-10 DIAGNOSIS — I824Z2 Acute embolism and thrombosis of unspecified deep veins of left distal lower extremity: Secondary | ICD-10-CM

## 2020-08-10 NOTE — Telephone Encounter (Signed)
Pts spouse is calling in stating that they are out of town and do not have good reception where they are.  Pt would like to see if they can get a referral for a vascular in his leg and would like to see if Dr. Sherren Mocha Early would clear him for his surgery in February 2022.  Pt had DVT and will be having hip replacement surgery in February and it is on the same leg as the blood clot.  Pt would like to have a call back to let him know if the referral can be done.

## 2020-08-11 NOTE — Telephone Encounter (Signed)
OK to set up referral to Dr Early with [facility]

## 2020-08-11 NOTE — Telephone Encounter (Signed)
Referral placed.

## 2020-08-13 ENCOUNTER — Telehealth: Payer: Self-pay | Admitting: Family Medicine

## 2020-08-13 NOTE — Telephone Encounter (Signed)
Spoke to pt to schedule  Medicare Annual Wellness Visit (AWV) either virtually or in office.   Last AWV 01/21/18  please schedule at anytime with [facility] Nurse Health Advisor 1 or 2   This should be a 45 minute visit.  He stated he would talk to his spouse and call back to schedule

## 2020-08-17 NOTE — Progress Notes (Deleted)
Cardiology Office Note    Date:  08/17/2020   ID:  Joe Davis, DOB 03-24-1946, MRN [id number]  PCP:  [name], MD  Cardiologist:  [name]   Chief Complaint: syncope  History of Present Illness:   Joe Davis is a 75 y.o. male with hx of OSA on CPAP, remote DVT and PE    Seen in  ER 09/18/19 after syncopal episode. Workup reassuring - reviewed personally. His symptoms felt possible vasovagal. Discharged from ER.   Monitor and echo ordered Patient cancelled monitor TTE reviewed 09/26/19 normal EF 55-60% no significant valve disease and normal RV with no signs of pulmnary HTN.  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 Arthritis    "knees, hips" (10/10/2014)  . Basal cell carcinoma   . BPH (benign prostatic hyperplasia)   . Bradycardia vasovagal response 10 yrs ago   asymptomatic  . Diastolic dysfunction    Per echo in 2008  . DVT (deep venous thrombosis) ([place]) 1979   RLE  . Erectile dysfunction   . History of gout   . History of nuclear stress test    ETT-Myoview 3/17: Hypertensive blood pressure response to exercise, normal perfusion, EF 60%, low risk study  . Hypercholesterolemia    ? statin intolerant. May tolerate Lipitor  . Normal nuclear stress test May 2011  . Obesity   . Pulmonary embolism ([place]) 1979  . Syncope and collapse 8-10 yrs ago    Past Surgical History:  Procedure Laterality Date  . CARDIOVASCULAR STRESS TEST  May 2011   EF 73%; No  ischemia  . COLONOSCOPY  2013  . JOINT REPLACEMENT    . KNEE ARTHROSCOPY Right 10/03/2012   Procedure: RIGHT KNEE ARTHROSCOPY WITH DEBRIDEMENT;  Surgeon: [name], MD;  Location: [facility];  Service: Orthopedics;  Laterality: Right;  WITH DEBRIDEMENT  . MOHS SURGERY  6 yrs    nose  . TOTAL HIP ARTHROPLASTY Right 10/06/2014    Current Medications:  Prior to Admission medications   Medication Sig Start Date End Date Taking? Authorizing Provider  aspirin 81 MG tablet Take 162 mg by mouth every morning.    Yes [provider]  atorvastatin (LIPITOR) 10 MG tablet Take 1 tablet (10 mg total) by mouth daily. Please call and schedule an appointment for further refills 1st attempt 03/29/18  Yes Josue Hector, MD  tadalafil (CIALIS) 5 MG tablet TK 1  T PO D UTD 11/01/17  Yes [provider]  timolol (TIMOPTIC) 0.5 % ophthalmic solution PLACE 1 GTT IN OU BID 05/15/17  Yes [provider]    Allergies:   Patient has no known allergies.   Social History   Socioeconomic History  . Marital status: Divorced    Spouse name: Not on file  . Number of children: Not on file  . Years of education: Not on file  . Highest education level: Not on file  Occupational History  . Not on file  Tobacco Use  . Smoking status: Former Smoker    Packs/day: 1.00    Years: 10.00    Pack years: 10.00    Types: Cigarettes    Quit date: 08/15/1977    Years since quitting: 43.0  . Smokeless tobacco: Never Used  Vaping Use  . Vaping Use: Never used  Substance and Sexual Activity  . Alcohol use: Yes    Alcohol/week: 7.0 standard drinks    Types: 7 Shots of liquor per week  . Drug use: No  . Sexual activity: Yes  Other Topics Concern  . Not on file  Social History Narrative  . Not on file   Social Determinants of Health   Financial Resource Strain: Not on file  Food Insecurity: Not on file  Transportation Needs: Not on file  Physical Activity: Not on file  Stress: Not on  file  Social Connections: Not on file     Family History:  The patient's He was adopted. Family history is unknown by patient.  ROS:   Please see the history of present illness.    ROS All other systems reviewed and are negative.   PHYSICAL EXAM:   VS:  There were no vitals taken for this visit.    Affect appropriate Healthy:  appears stated age 70: normal Neck supple with no adenopathy JVP normal no bruits no thyromegaly Lungs clear with no wheezing and good diaphragmatic motion Heart:  S1/S2 no murmur, no rub, gallop or click PMI normal Abdomen: benighn, BS positve, no tenderness, no AAA no bruit.  No HSM or HJR Distal pulses intact with no bruits No edema Neuro non-focal Skin warm and dry No muscular weakness   Wt Readings from Last 3 Encounters:  04/14/20 101.6 kg  04/03/20 100.8 kg  03/18/20 99.8 kg      Studies/Labs Reviewed:   EKG:   SR LAFB normal QT 427  Recent Labs: 09/18/2019: BUN 18; Creatinine, Ser 0.81; Hemoglobin 15.5; Platelets 180; Potassium 4.4; Sodium 139 01/03/2020: ALT 18   Lipid Panel    Component Value Date/Time   CHOL 192 01/03/2020 1034   TRIG 70 01/03/2020 1034   HDL 50 01/03/2020 1034   CHOLHDL 3.8 01/03/2020 1034   CHOLHDL 3.4 12/15/2015 0802   VLDL 17 12/15/2015 0802   LDLCALC 129 (H) 01/03/2020 1034   LDLDIRECT 125.7 09/25/2012 0914    Additional studies/ records that were reviewed today include:   TTE 09/26/19  ASSESSMENT & PLAN:    1. Syncope : normal echo monitor cancelled by patient likely vasavogal no further w/u at this time   2.  History of DVT and PE: normal RV function on TTE ASA   3.  HLD- LDL 129 01/03/20 had not been taking lipitor   Calcium score 1657 85 th percentile 01/03/20 Discussed importance of compliance f/u labs ordered   4. CAD:  Asymptomatic high calcium score see above normal non ischemic myovue 01/08/20 continue medical Rx  5. DVT:  Has been on xarelto since early August Will need to discuss  with ortho/primary timing of any hip replacement surgery and meticulous further DVT prophylaxis given residual disease on f/u US 06/22/20 He has been referred to [facility] Dr Early to further address this issue   6. Preoperative:  Ok to proceed with orthopedic surgery from cardiac perspective see caveat regarding #5 above   7. Pulmonary:  Sees Dr Lamonte Sakai CT chest 06/15/20 some subpleural ground glass opacities in medial  lower lungs likely Post infectious/inflammatory to have f/u CT per pulmonary   Lipid / Liver F/U in a year  F/U 6 months    Medication Adjustments/Labs and Tests Ordered: Current medicines are reviewed at length with the patient today.  Concerns regarding medicines are outlined above.  Medication changes, Labs and Tests ordered today are listed in the Patient Instructions below. There are no Patient Instructions on file for this visit.   Signed, Charlton Haws, MD  08/17/2020 3:51 PM    [facility] [address] [place], [place], [place]  [address] Phone: [phone]; Fax: [phone]

## 2020-08-20 ENCOUNTER — Ambulatory Visit: Payer: Medicare Other | Admitting: Cardiovascular Disease

## 2020-08-24 ENCOUNTER — Other Ambulatory Visit: Payer: Self-pay

## 2020-08-24 ENCOUNTER — Ambulatory Visit (INDEPENDENT_AMBULATORY_CARE_PROVIDER_SITE_OTHER): Payer: Medicare Other | Admitting: Physician Assistant

## 2020-08-24 VITALS — BP 144/77 | HR 55 | Temp 97.0°F | Resp 16 | Ht 70.5 in | Wt 217.0 lb

## 2020-08-24 DIAGNOSIS — I824Z2 Acute embolism and thrombosis of unspecified deep veins of left distal lower extremity: Secondary | ICD-10-CM

## 2020-08-24 DIAGNOSIS — Z86718 Personal history of other venous thrombosis and embolism: Secondary | ICD-10-CM

## 2020-08-24 NOTE — Progress Notes (Signed)
Office Note     CC:  follow up Requesting Provider:  Kristian Covey, MD  HPI: Joe Davis is a 75 y.o. (02-Jul-1946) male who is referred secondary to diagnosis of left  lower extremity DVT. Initially started on Eliquis and this was changed to Xarelto due to affordability.  He underwent repeat duplex venous ultrasound on June 22, 2020 with findings of partially recannulized femoral and popliteal veins on the left.  He states his swelling and pain from the DVT have resolved, however he is in need of left hip replacement and has left hip pain and referred left knee pain.  He is having difficulty ambulating due to joint disease and has left hip replacement scheduled for October 10, 2020 by Dr. Sherin Quarry at [facility].  The patient is accompanied today by his wife and they have several questions in regards to his DVT and upcoming surgery.  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  This was treated with Coumadin which he took for an extended period of time and then discontinued.  He did not undergo hematology work-up for underlying coagulopathy.  The patient and his wife are unaware of a provoking factor.  He has no prior history of malignancy or hospitalization prior to DVT diagnosis.  The patient has no significant coronary history, no history of coronary intervention or valve replacement.  No history of dysrhythmia or pacemaker insertion.   Past Medical History:  Diagnosis Date  . Arthritis    "knees, hips" (10/10/2014)  . Basal cell carcinoma   . BPH (benign prostatic hyperplasia)   . Bradycardia vasovagal response 10 yrs ago   asymptomatic  . Diastolic dysfunction    Per echo in 2008  . DVT (deep venous thrombosis) ([facility]) 1979   RLE  . Erectile dysfunction   . History of gout   . History of nuclear stress test    ETT-Myoview 3/17: Hypertensive blood pressure response to exercise, normal perfusion, EF 60%, low risk  study  . Hypercholesterolemia    ? statin intolerant. May tolerate Lipitor  . Normal nuclear stress test May 2011  . Obesity   . Pulmonary embolism ([facility]) 1979  . Syncope and collapse 8-10 yrs ago    Past Surgical History:  Procedure Laterality Date  . CARDIOVASCULAR STRESS TEST  May 2011   EF 73%; No ischemia  . COLONOSCOPY  2013  . JOINT REPLACEMENT    . KNEE ARTHROSCOPY Right 10/03/2012   Procedure: RIGHT KNEE ARTHROSCOPY WITH DEBRIDEMENT;  Surgeon: Loanne Drilling, MD;  Location: [facility];  Service: Orthopedics;  Laterality: Right;  WITH DEBRIDEMENT  . MOHS SURGERY  6 yrs    nose  . TOTAL HIP ARTHROPLASTY Right 10/06/2014    Social History   Socioeconomic History  . Marital status: Divorced    Spouse name: Not on file  . Number of children: Not on file  . Years of education: Not on file  . Highest education level: Not on file  Occupational History  . Not on file  Tobacco Use  . Smoking status: Former Smoker    Packs/day: 1.00    Years: 10.00    Pack years: 10.00    Types: Cigarettes    Quit date: 08/15/1977    Years since quitting: 43.0  . Smokeless tobacco: Never Used  Vaping Use  . Vaping Use: Never used  Substance and Sexual Activity  . Alcohol use: Yes  Alcohol/week: 7.0 standard drinks    Types: 7 Shots of liquor per week  . Drug use: No  . Sexual activity: Yes  Other Topics Concern  . Not on file  Social History Narrative  . Not on file   Social Determinants of Health   Financial Resource Strain: Not on file  Food Insecurity: Not on file  Transportation Needs: Not on file  Physical Activity: Not on file  Stress: Not on file  Social Connections: Not on file  Intimate Partner Violence: Not on file   Family History  Adopted: Yes  Family history unknown: Yes    Current Outpatient Medications  Medication Sig Dispense Refill  . atorvastatin (LIPITOR) 10 MG tablet Take 1 tablet (10 mg total) by mouth daily. Please call and schedule an appointment  for further refills 1st attempt 90 tablet 0  . hydrochlorothiazide (HYDRODIURIL) 25 MG tablet Take 25 mg by mouth daily.    . rivaroxaban (XARELTO) 20 MG TABS tablet Take 1 tablet (20 mg total) by mouth daily with supper. 5 tablet 0  . tadalafil (CIALIS) 5 MG tablet TK 1  T PO D UTD  10   No current facility-administered medications for this visit.    No Known Allergies   REVIEW OF SYSTEMS:   [X]  denotes positive finding, [ ]  denotes negative finding Cardiac  Comments:  Chest pain or chest pressure:    Shortness of breath upon exertion:    Short of breath when lying flat:    Irregular heart rhythm:        Vascular    Pain in calf, thigh, or hip brought on by ambulation: x  see HPI  Pain in feet at night that wakes you up from your sleep:     Blood clot in your veins: x   Leg swelling:         Pulmonary    Oxygen at home:    Productive cough:     Wheezing:         Neurologic    Sudden weakness in arms or legs:     Sudden numbness in arms or legs:     Sudden onset of difficulty speaking or slurred speech:    Temporary loss of vision in one eye:     Problems with dizziness:         Gastrointestinal    Blood in stool:     Vomited blood:         Genitourinary    Burning when urinating:     Blood in urine:        Psychiatric    Major depression:         Hematologic    Bleeding problems:    Problems with blood clotting too easily:        Skin    Rashes or ulcers:        Constitutional    Fever or chills:      PHYSICAL EXAMINATION:  Vitals:   08/24/20 1322  BP: (!) 144/77  Pulse: (!) 55  Resp: 16  Temp: (!) 97 F (36.1 C)  SpO2: 98%   General:  WDWN in NAD; vital signs documented above Gait: Not observed HENT: WNL, normocephalic Pulmonary: normal non-labored breathing  Cardiac: regular HR,  Skin: without rashes Vascular Exam/Pulses: 2+ radial, femoral, dorsalis pedis and posterior tibial pulses bilaterally Extremities: without ischemic changes,  without Gangrene , without cellulitis; without open wounds; without edema.  Left medial distal thigh venous varicosities.  Reticular veins of both ankles. musculoskeletal: no muscle wasting or atrophy  Neurologic: A&O X 3;  No focal weakness or paresthesias are detected Psychiatric:  The pt has Normal affect.  March 18, 2020 bilateral lower extremity venous duplex: Chronic right femoral, popliteal, posterior tibial and peroneal thrombus.  Acute left common femoral, popliteal, peroneal and gastroc vein thrombus  ASSESSMENT/PLAN:: 75 y.o. male referred secondary to second episode of lower extremity DVT.  I discussed the case with Dr. Trula Slade.  Dr. Trula Slade discussed with the patient and his wife his recommendations for retrievable IVC filter prior to left hip replacement surgery.  We discussed the procedure and risks including, but not limited to, bleeding, obstruction of filter due to blood clots.  Dr. Trula Slade advises shortest time frame as is reasonable for holding Xarelto.  He also recommends that we remove the filter in a timely fashion once he is recovered from hip surgery.  The patient and his wife verbalized understanding, their questions are answered and agreed to schedule IVC filter placement.  Barbie Banner, PA-C [facility] [phone]  Clinic MD:   Trula Slade

## 2020-08-27 DIAGNOSIS — R197 Diarrhea, unspecified: Secondary | ICD-10-CM | POA: Diagnosis not present

## 2020-08-28 DIAGNOSIS — R197 Diarrhea, unspecified: Secondary | ICD-10-CM | POA: Diagnosis not present

## 2020-09-22 NOTE — Progress Notes (Incomplete)
Virtual Visit via Video Note   This visit type was conducted due to national recommendations for restrictions regarding the COVID-19 Pandemic (e.g. social distancing) in an effort to limit this patient's exposure and mitigate transmission in our community.  Due to her co-morbid illnesses, this patient is at least at moderate risk for complications without adequate follow up.  This format is felt to be most appropriate for this patient at this time.  All issues noted in this document were discussed and addressed.  A limited physical exam was performed with this format.  Please refer to the patient's chart for her consent to telehealth for [facility].   Patient Location: Home Physician Location: Office   Date:  09/22/2020   ID:  Joe Davis, DOB 15-Nov-1945, MRN [id number]  PCP:  Joe Post, MD  Cardiologist:  Joe Davis    History of Present Illness:   Joe Davis is a 75 y.o. male with hx of OSA on CPAP, remote DVT and PE and HLD  Prior hx of dyspnea. Echo 09/2014 with LVEF of 55-65% and grade 1 DD.  Exercise myoview 10/2015 was low risk stress nuclear study with normal perfusion and normal left ventricular regional and global systolic function. Hypertensive response to exercise.  Went to ER 09/18/19 after syncopal episode. Workup reassuring - reviewed personally. His symptoms felt possible vasovagal. Discharged from ER.   Previous "syncopal" episodes that to be vasovagal   Monitor and echo ordered Patient cancelled monitor TTE reviewed 09/26/19 normal EF 55-60% no significant valve disease and normal RV with no signs of pulmnary HTN.  Trivial AR and AV sclerosis   Since then has had uneventful cataract surgery  Weight is up 15 lbs since March 2020   Significant arthritis and may need orthopedic surgery hip/knee at [facility]   Calcium score 01/03/20 very high 1657 involving 3 vessels  F/U myovue 01/08/20 normal with no ischemia and EF 55%  He had RLE DVT in 79 with PE Rx with  coumadin for a time. Not known to be hypercoagulable Had a LLE DVT in August  Extensive involving the CFV, FV, PV, PTV, PV and gastocnemius veins  Seen by [facility] Joe Davis recommended tetrievable IVC filter prior to having left hip surgery at [facility] and was on Xarelto   Left hip replacement scheduled for 2/28 at [facility] with Joe Davis   ***  Past Medical History:  Diagnosis Date  . Arthritis    "knees, hips" (10/10/2014)  . Basal cell carcinoma   . BPH (benign prostatic hyperplasia)   . Bradycardia vasovagal response 10 yrs ago   asymptomatic  . Diastolic dysfunction    Per echo in 2008  . DVT (deep venous thrombosis) ([place]) 1979   RLE  . Erectile dysfunction   . History of gout   . History of nuclear stress test    ETT-Myoview 3/17: Hypertensive blood pressure response to exercise, normal perfusion, EF 60%, low risk study  . Hypercholesterolemia    ? statin intolerant. May tolerate Lipitor  . Normal nuclear stress test May 2011  . Obesity   . Pulmonary embolism ([place]) 1979  . Syncope and collapse 8-10 yrs ago    Past Surgical History:  Procedure Laterality Date  . CARDIOVASCULAR STRESS TEST  May 2011   EF 73%; No ischemia  . COLONOSCOPY  2013  . JOINT REPLACEMENT    . KNEE ARTHROSCOPY Right 10/03/2012   Procedure: RIGHT KNEE ARTHROSCOPY WITH DEBRIDEMENT;  Surgeon: Joe Alf,  MD;  Location: [facility];  Service: Orthopedics;  Laterality: Right;  WITH DEBRIDEMENT  . MOHS SURGERY  6 yrs    nose  . TOTAL HIP ARTHROPLASTY Right 10/06/2014    Current Medications:  Prior to Admission medications   Medication Sig Start Date End Date Taking? Authorizing Provider  aspirin 81 MG tablet Take 162 mg by mouth every morning.    Yes [provider]  atorvastatin (LIPITOR) 10 MG tablet Take 1 tablet (10 mg total) by mouth daily. Please call and schedule an appointment for further refills 1st attempt 03/29/18  Yes Joe Hector, MD  tadalafil (CIALIS) 5 MG tablet TK 1  T PO D UTD  11/01/17  Yes [provider]  timolol (TIMOPTIC) 0.5 % ophthalmic solution PLACE 1 GTT IN OU BID 05/15/17  Yes [provider]    Allergies:   Patient has no known allergies.   Social History   Socioeconomic History  . Marital status: Divorced    Spouse name: Not on file  . Number of children: Not on file  . Years of education: Not on file  . Highest education level: Not on file  Occupational History  . Not on file  Tobacco Use  . Smoking status: Former Smoker    Packs/day: 1.00    Years: 10.00    Pack years: 10.00    Types: Cigarettes    Quit date: 08/15/1977    Years since quitting: 43.1  . Smokeless tobacco: Never Used  Vaping Use  . Vaping Use: Never used  Substance and Sexual Activity  . Alcohol use: Yes    Alcohol/week: 7.0 standard drinks    Types: 7 Shots of liquor per week  . Drug use: No  . Sexual activity: Yes  Other Topics Concern  . Not on file  Social History Narrative  . Not on file   Social Determinants of Health   Financial Resource Strain: Not on file  Food Insecurity: Not on file  Transportation Needs: Not on file  Physical Activity: Not on file  Stress: Not on file  Social Connections: Not on file     Family History:  The patient's He was adopted. Family history is unknown by patient.  ROS:   Please see the history of present illness.    ROS All other systems reviewed and are negative.   PHYSICAL EXAM:   VS:  There were no vitals taken for this visit.    ***  Wt Readings from Last 3 Encounters:  08/24/20 98.4 kg  04/14/20 101.6 kg  04/03/20 100.8 kg      Studies/Labs Reviewed:   EKG:   SR LAFB normal QT 427  Recent Labs: 01/03/2020: ALT 18   Lipid Panel    Component Value Date/Time   CHOL 192 01/03/2020 1034   TRIG 70 01/03/2020 1034   HDL 50 01/03/2020 1034   CHOLHDL 3.8 01/03/2020 1034   CHOLHDL 3.4 12/15/2015 0802   VLDL 17 12/15/2015 0802   LDLCALC 129 (H) 01/03/2020 1034   LDLDIRECT 125.7  09/25/2012 0914    Additional studies/ records that were reviewed today include:   TTE 09/26/19  ASSESSMENT & PLAN:    1. Syncope : normal echo monitor cancelled by patient likely vasavogal no further w/u at this time   2.  DVT: recurrent on xarelto seen by [facility] IVC retrievable placed *** prior to left       Hip surgery   3.  HLD- On lipitor 10mg  qd.  Followed by PCP. LDL 97 in 2019 discussed      possibility of calcium score to further establish target goal for LDL  He indicates not really taking his statin as he is a bit pill averse will check labs today    4. CAD: high calcium score myovue normal 01/08/20 on statin   Medication Adjustments/Labs and Tests Ordered: Current medicines are reviewed at length with the patient today.  Concerns regarding medicines are outlined above.  Medication changes, Labs and Tests ordered today are listed in the Patient Instructions below. There are no Patient Instructions on file for this visit.   Signed, Jenkins Rouge, MD  09/22/2020 12:41 PM    [facility] [place], [place], [place]  [address] Phone: [phone]; Fax: [phone]

## 2020-09-30 DIAGNOSIS — Z01818 Encounter for other preprocedural examination: Secondary | ICD-10-CM | POA: Diagnosis not present

## 2020-09-30 DIAGNOSIS — E7800 Pure hypercholesterolemia, unspecified: Secondary | ICD-10-CM | POA: Diagnosis not present

## 2020-09-30 DIAGNOSIS — Z87898 Personal history of other specified conditions: Secondary | ICD-10-CM | POA: Diagnosis not present

## 2020-09-30 DIAGNOSIS — M1612 Unilateral primary osteoarthritis, left hip: Secondary | ICD-10-CM | POA: Diagnosis not present

## 2020-09-30 DIAGNOSIS — R918 Other nonspecific abnormal finding of lung field: Secondary | ICD-10-CM | POA: Diagnosis not present

## 2020-09-30 DIAGNOSIS — Z86718 Personal history of other venous thrombosis and embolism: Secondary | ICD-10-CM | POA: Diagnosis not present

## 2020-09-30 DIAGNOSIS — R0600 Dyspnea, unspecified: Secondary | ICD-10-CM | POA: Diagnosis not present

## 2020-09-30 DIAGNOSIS — M1A9XX0 Chronic gout, unspecified, without tophus (tophi): Secondary | ICD-10-CM | POA: Diagnosis not present

## 2020-09-30 DIAGNOSIS — M1A00X0 Idiopathic chronic gout, unspecified site, without tophus (tophi): Secondary | ICD-10-CM | POA: Diagnosis not present

## 2020-09-30 DIAGNOSIS — G4733 Obstructive sleep apnea (adult) (pediatric): Secondary | ICD-10-CM | POA: Diagnosis not present

## 2020-09-30 DIAGNOSIS — R001 Bradycardia, unspecified: Secondary | ICD-10-CM | POA: Diagnosis not present

## 2020-10-01 ENCOUNTER — Telehealth: Payer: Medicare Other | Admitting: Cardiovascular Disease

## 2020-10-05 ENCOUNTER — Other Ambulatory Visit (HOSPITAL_COMMUNITY)
Admission: RE | Admit: 2020-10-05 | Discharge: 2020-10-05 | Disposition: A | Discharge status: Home / Self Care | Payer: Medicare Other | Source: Ambulatory Visit | Attending: Surgery | Admitting: Surgery

## 2020-10-05 DIAGNOSIS — Z20822 Contact with and (suspected) exposure to covid-19: Secondary | ICD-10-CM | POA: Insufficient documentation

## 2020-10-05 DIAGNOSIS — Z01812 Encounter for preprocedural laboratory examination: Secondary | ICD-10-CM | POA: Insufficient documentation

## 2020-10-06 ENCOUNTER — Ambulatory Visit (HOSPITAL_COMMUNITY)
Admission: RE | Admit: 2020-10-06 | Discharge: 2020-10-06 | Disposition: A | Discharge status: Home / Self Care | Payer: Medicare Other | Attending: Surgery | Admitting: Surgery

## 2020-10-06 ENCOUNTER — Encounter (HOSPITAL_COMMUNITY): Payer: Self-pay | Admitting: Surgery

## 2020-10-06 ENCOUNTER — Encounter (HOSPITAL_COMMUNITY)
Admission: RE | Disposition: A | Discharge status: Home / Self Care | Payer: Self-pay | Source: Home / Self Care | Attending: Surgery

## 2020-10-06 DIAGNOSIS — Z86718 Personal history of other venous thrombosis and embolism: Secondary | ICD-10-CM | POA: Diagnosis not present

## 2020-10-06 DIAGNOSIS — Z87891 Personal history of nicotine dependence: Secondary | ICD-10-CM | POA: Insufficient documentation

## 2020-10-06 DIAGNOSIS — Z79899 Other long term (current) drug therapy: Secondary | ICD-10-CM | POA: Diagnosis not present

## 2020-10-06 DIAGNOSIS — Z7901 Long term (current) use of anticoagulants: Secondary | ICD-10-CM | POA: Diagnosis not present

## 2020-10-06 DIAGNOSIS — M25559 Pain in unspecified hip: Secondary | ICD-10-CM | POA: Diagnosis not present

## 2020-10-06 HISTORY — PX: IVC FILTER INSERTION: CATH118245

## 2020-10-06 LAB — POCT I-STAT, CHEM 8
BUN: 22 mg/dL (ref 8–23)
Calcium, Ion: 1.2 mmol/L (ref 1.15–1.40)
Chloride: 107 mmol/L (ref 98–111)
Creatinine, Ser: 0.8 mg/dL (ref 0.61–1.24)
Glucose, Bld: 107 mg/dL — ABNORMAL HIGH (ref 70–99)
HCT: 41 % (ref 39.0–52.0)
Hemoglobin: 13.9 g/dL (ref 13.0–17.0)
Potassium: 4.2 mmol/L (ref 3.5–5.1)
Sodium: 142 mmol/L (ref 135–145)
TCO2: 23 mmol/L (ref 22–32)

## 2020-10-06 LAB — SARS CORONAVIRUS 2 (TAT 6-24 HRS): SARS Coronavirus 2: NEGATIVE

## 2020-10-06 SURGERY — IVC FILTER INSERTION
Anesthesia: LOCAL

## 2020-10-06 MED ORDER — MIDAZOLAM HCL 2 MG/2ML IJ SOLN
INTRAMUSCULAR | Status: DC | PRN
  Administered 2020-10-06: 2 mg via INTRAVENOUS

## 2020-10-06 MED ORDER — IODIXANOL 320 MG/ML IV SOLN
INTRAVENOUS | Status: DC | PRN
  Administered 2020-10-06: 20 mL

## 2020-10-06 MED ORDER — FENTANYL CITRATE (PF) 100 MCG/2ML IJ SOLN
INTRAMUSCULAR | Status: DC | PRN
  Administered 2020-10-06: 50 ug via INTRAVENOUS

## 2020-10-06 MED ORDER — LIDOCAINE HCL (PF) 1 % IJ SOLN
INTRAMUSCULAR | Status: AC
  Filled 2020-10-06: qty 30

## 2020-10-06 MED ORDER — LIDOCAINE HCL (PF) 1 % IJ SOLN
INTRAMUSCULAR | Status: DC | PRN
  Administered 2020-10-06: 15 mL

## 2020-10-06 MED ORDER — HEPARIN (PORCINE) IN NACL 1000-0.9 UT/500ML-% IV SOLN
INTRAVENOUS | Status: DC | PRN
  Administered 2020-10-06: 500 mL

## 2020-10-06 MED ORDER — HEPARIN (PORCINE) IN NACL 1000-0.9 UT/500ML-% IV SOLN
INTRAVENOUS | Status: AC
  Filled 2020-10-06: qty 1000

## 2020-10-06 MED ORDER — SODIUM CHLORIDE 0.9 % IV SOLN: INTRAVENOUS | Status: DC

## 2020-10-06 MED ORDER — FENTANYL CITRATE (PF) 100 MCG/2ML IJ SOLN
INTRAMUSCULAR | Status: AC
  Filled 2020-10-06: qty 2

## 2020-10-06 MED ORDER — MIDAZOLAM HCL 5 MG/5ML IJ SOLN
INTRAMUSCULAR | Status: AC
  Filled 2020-10-06: qty 5

## 2020-10-06 SURGICAL SUPPLY — 12 items
BAG SNAP BAND KOVER 36X36 (MISCELLANEOUS) ×1 IMPLANT
CATH OMNI FLUSH 5F 65CM (CATHETERS) ×1 IMPLANT
COVER DOME SNAP 22 D (MISCELLANEOUS) ×1 IMPLANT
FILTER VC CELECT-FEMORAL (Filter) ×1 IMPLANT
KIT MICROPUNCTURE NIT STIFF (SHEATH) ×1 IMPLANT
PROTECTION STATION PRESSURIZED (MISCELLANEOUS) ×2
SHEATH PROBE COVER 6X72 (BAG) ×1 IMPLANT
STATION PROTECTION PRESSURIZED (MISCELLANEOUS) IMPLANT
STOPCOCK MORSE 400PSI 3WAY (MISCELLANEOUS) ×1 IMPLANT
TRAY PV CATH (CUSTOM PROCEDURE TRAY) ×2 IMPLANT
TUBING CIL FLEX 10 FLL-RA (TUBING) ×1 IMPLANT
WIRE STARTER BENTSON 035X150 (WIRE) ×1 IMPLANT

## 2020-10-06 NOTE — H&P (Signed)
Office Note     CC:  follow up Requesting Provider:  [name], MD  HPI: Joe Davis is a 75 y.o. (30-Aug-1945) male who is referred secondary to diagnosis of left  lower extremity DVT. Initially started on Eliquis and this was changed to Xarelto due to affordability.  He underwent repeat duplex venous ultrasound on November 8, Joe Davis with findings of partially recannulized femoral and popliteal veins on the left.  He states his swelling and pain from the DVT have resolved, however he is in need of left hip replacement and has left hip pain and referred left knee pain.  He is having difficulty ambulating due to joint disease and has left hip replacement scheduled for October 10, 2020 by Dr. [name] at [facility].  The patient is accompanied today by his wife and they have several questions in regards to his DVT and upcoming surgery.  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  This was treated with Coumadin which he took for an extended period of time and then discontinued.  He did not undergo hematology work-up for underlying coagulopathy.  The patient and his wife are unaware of a provoking factor.  He has no prior history of malignancy or hospitalization prior to DVT diagnosis.  The patient has no significant coronary history, no history of coronary intervention or valve replacement.  No history of dysrhythmia or pacemaker insertion.       Past Medical History:  Diagnosis Date  . Arthritis    "knees, hips" (10/10/2014)  . Basal cell carcinoma   . BPH (benign prostatic hyperplasia)   . Bradycardia vasovagal response 10 yrs ago   asymptomatic  . Diastolic dysfunction    Per echo in 2008  . DVT (deep venous thrombosis) ([place]) 1979   RLE  . Erectile dysfunction   . History of gout   . History of nuclear stress test    ETT-Myoview 3/17: Hypertensive blood pressure response to exercise, normal  perfusion, EF 60%, low risk study  . Hypercholesterolemia    ? statin intolerant. May tolerate Lipitor  . Normal nuclear stress test May 2011  . Obesity   . Pulmonary embolism ([place]) 1979  . Syncope and collapse 8-10 yrs ago         Past Surgical History:  Procedure Laterality Date  . CARDIOVASCULAR STRESS TEST  May 2011   EF 73%; No ischemia  . COLONOSCOPY  2013  . JOINT REPLACEMENT    . KNEE ARTHROSCOPY Right 10/03/2012   Procedure: RIGHT KNEE ARTHROSCOPY WITH DEBRIDEMENT;  Surgeon: Joe Alf, MD;  Location: [facility];  Service: Orthopedics;  Laterality: Right;  WITH DEBRIDEMENT  . MOHS SURGERY  6 yrs    nose  . TOTAL HIP ARTHROPLASTY Right 10/06/2014    Social History        Socioeconomic History  . Marital status: Divorced    Spouse name: Not on file  . Number of children: Not on file  . Years of education: Not on file  . Highest education level: Not on file  Occupational History  . Not on file  Tobacco Use  . Smoking status: Former Smoker    Packs/day: 1.00    Years: 10.00    Pack years: 10.00    Types: Cigarettes    Quit date: 08/15/1977    Years since quitting: 43.0  . Smokeless tobacco: Never Used  Vaping Use  . Vaping Use:  Never used  Substance and Sexual Activity  . Alcohol use: Yes    Alcohol/week: 7.0 standard drinks    Types: 7 Shots of liquor per week  . Drug use: No  . Sexual activity: Yes  Other Topics Concern  . Not on file  Social History Narrative  . Not on file   Social Determinants of Health   Financial Resource Strain: Not on file  Food Insecurity: Not on file  Transportation Needs: Not on file  Physical Activity: Not on file  Stress: Not on file  Social Connections: Not on file  Intimate Partner Violence: Not on file   Family History  Adopted: Yes  Family history unknown: Yes          Current Outpatient Medications  Medication Sig Dispense Refill  . atorvastatin (LIPITOR) 10 MG tablet  Take 1 tablet (10 mg total) by mouth daily. Please call and schedule an appointment for further refills 1st attempt 90 tablet 0  . hydrochlorothiazide (HYDRODIURIL) 25 MG tablet Take 25 mg by mouth daily.    . rivaroxaban (XARELTO) 20 MG TABS tablet Take 1 tablet (20 mg total) by mouth daily with supper. 5 tablet 0  . tadalafil (CIALIS) 5 MG tablet TK 1  T PO D UTD  10   No current facility-administered medications for this visit.    No Known Allergies   REVIEW OF SYSTEMS:   [X]  denotes positive finding, [ ]  denotes negative finding Cardiac  Comments:  Chest pain or chest pressure:    Shortness of breath upon exertion:    Short of breath when lying flat:    Irregular heart rhythm:        Vascular    Pain in calf, thigh, or hip brought on by ambulation: x  see HPI  Pain in feet at night that wakes you up from your sleep:     Blood clot in your veins: x   Leg swelling:         Pulmonary    Oxygen at home:    Productive cough:     Wheezing:         Neurologic    Sudden weakness in arms or legs:     Sudden numbness in arms or legs:     Sudden onset of difficulty speaking or slurred speech:    Temporary loss of vision in one eye:     Problems with dizziness:         Gastrointestinal    Blood in stool:     Vomited blood:         Genitourinary    Burning when urinating:     Blood in urine:        Psychiatric    Major depression:         Hematologic    Bleeding problems:    Problems with blood clotting too easily:        Skin    Rashes or ulcers:        Constitutional    Fever or chills:      PHYSICAL EXAMINATION:     Vitals:   08/24/20 1322  BP: (!) 144/77  Pulse: (!) 55  Resp: 16  Temp: (!) 97 F (36.1 C)  SpO2: 98%   General:  WDWN in NAD; vital signs documented above Gait: Not observed HENT: WNL, normocephalic Pulmonary: normal non-labored  breathing  Cardiac: regular HR,  Skin: without rashes Vascular Exam/Pulses: 2+ radial, femoral, dorsalis pedis and posterior  tibial pulses bilaterally Extremities: without ischemic changes, without Gangrene , without cellulitis; without open wounds; without edema.  Left medial distal thigh venous varicosities.  Reticular veins of both ankles. musculoskeletal: no muscle wasting or atrophy       Neurologic: A&O X 3;  No focal weakness or paresthesias are detected Psychiatric:  The pt has Normal affect.  Joe Davis, Joe Davis bilateral lower extremity venous duplex: Chronic right femoral, popliteal, posterior tibial and peroneal thrombus.  Acute left common femoral, popliteal, peroneal and gastroc vein thrombus  ASSESSMENT/PLAN:: 75 y.o. male referred secondary to second episode of lower extremity DVT.  I discussed the case with Dr. Trula Slade.  Dr. Trula Slade discussed with the patient and his wife his recommendations for retrievable IVC filter prior to left hip replacement surgery.  We discussed the procedure and risks including, but not limited to, bleeding, obstruction of filter due to blood clots.  Dr. Trula Slade advises shortest time frame as is reasonable for holding Xarelto.  He also recommends that we remove the filter in a timely fashion once he is recovered from hip surgery.  The patient and his wife verbalized understanding, their questions are answered and agreed to schedule IVC filter placement.  Barbie Banner, PA-C [facility]   No interval changes CV:RRR PULM:CTA BDZ:HGDJ Plan for temporary IVC filter placement in a patient with 2 prior episodes of DVT and planned hip surgery.  He will need filter removed after he has recovered from hip surgery  Annamarie Major

## 2020-10-06 NOTE — Discharge Instructions (Signed)
Inferior Vena Cava Filter Insertion, Care After This sheet gives you information about how to care for yourself after your procedure. Your health care provider may also give you more specific instructions. If you have problems or questions, contact your health care provider. What can I expect after the procedure? After the procedure, it is common to have:  Mild pain in the area where the long, thin tube (catheter) used to deliver the filter was inserted.  Mild bruising in the area where the catheter used to deliver the filter was inserted. Follow these instructions at home: Insertion site care  Follow instructions from your health care provider about how to take care of your catheter insertion site. Make sure you: ? Wash your hands with soap and water for at least 20 seconds before and after you change your bandage (dressing). If soap and water are not available, use hand sanitizer. ? Change your dressing as told by your health care provider.  Keep the dressing and the insertion site clean and dry.  Check your insertion site every day for signs of infection. Check for: ? More redness, swelling, or pain. ? Fluid or blood. ? Warmth. ? Pus or a bad smell.  Do not take baths, swim, or use a hot tub until your health care provider approves. Ask your health care provider if you may take showers.   Activity  Avoid strenuous exercise or activities that take a lot of effort for 48 hours after the procedure or as told by your health care provider.  Do not go back to school or work until your health care provider approves.  Do not lift anything that is heavier than 5 lb (2.3 kg), or the limit that you are told, until your health care provider says that it is safe. Driving  If you were given a sedative during the procedure, it can affect you for several hours. Do not drive or operate machinery until your health care provider says that it is safe.  Ask your health care provider if the medicine  prescribed to you requires you to avoid driving or using heavy machinery. General instructions  Take over-the-counter and prescription medicines only as told by your health care provider.  Wear compression stockings as told by your health care provider. These stockings help to prevent blood clots and reduce swelling in your legs.  Keep all follow-up visits as told by your health care provider. This is important. Contact a health care provider if:  You have any of these signs of infection: ? More redness, swelling, or pain around your catheter insertion site. ? Fluid or blood coming from your insertion site. ? Warmth coming from your insertion site. ? Pus or a bad smell coming from your insertion site. ? A fever.  You are dizzy.  You have nausea and vomiting.  You develop a rash. Get help right away if:  You have chest pain, a cough, or difficulty breathing.  You have shortness of breath, feel faint, or pass out.  You cough up blood.  You have severe pain in your abdomen.  You develop swelling and discoloration or pain in your legs.  Your legs become pale and cold or blue.  You have weakness, difficulty moving your arms or legs, or balance problems.  You develop problems with speech or vision. These symptoms may represent a serious problem that is an emergency. Do not wait to see if the symptoms will go away. Get medical help right away. Call your local emergency   services (911 in the [place] Do not drive yourself to the hospital. Summary  After the procedure, it is common to have mild pain and bruising where a catheter was inserted at your neck or groin (catheter insertion site).  Do not shower, bathe, use a hot tub, or let the dressing get wet until your health care provider approves.  Every day, check for signs of infection at the catheter insertion site. This information is not intended to replace advice given to you by your health care provider. Make sure you discuss  any questions you have with your health care provider. Document Revised: 07/31/2019 Document Reviewed: 07/31/2019 Elsevier Patient Education  2021 [employer]  

## 2020-10-06 NOTE — Op Note (Signed)
    Patient name: Joe Davis MRN: [id number] DOB: [birth year] Sex: male  10/06/2020 Pre-operative Diagnosis: DVT Post-operative diagnosis:  Same Surgeon:  Annamarie Major Procedure Performed:  1.  Ultrasound-guided access, right femoral vein  2.  IVC filter insertion  3.  Conscious sedation, 16 minutes    Indications: The patient has a history of DVT.  He is scheduled for hip surgery and so temporary IVC filter placement has been recommended.  Procedure:  The patient was identified in the holding area and taken to room 8.  The patient was then placed supine on the table and prepped and draped in the usual sterile fashion.  A time out was called.  Conscious sedation was administered with the use of IV fentanyl and Versed under continuous physician and nurse monitoring.  Heart rate, blood pressure, and oxygen saturation were continuously monitored.  Total sedation time was 47minutes.  Ultrasound was used to evaluate the right common femoral vein which was widely patent and easily compressible.  The right common femoral vein was then cannulated under ultrasound guidance with a micropuncture needle.  A 018 wire was advanced without resistance followed by placement of a micropuncture sheath.  A 035 Bentson wire was inserted and a pigtail catheter was advanced into the right common iliac artery and a venacavogram was performed.  Findings:   Vena cava: The cava is widely patent.  Diameters are appropriate for filter placement.  There is no abnormal anatomy. Intervention: Next, the filter introducing sheath was inserted followed by a Cook Celect filter.  This was positioned with the tip of the filter at the top of the L3 vertebral body.  It was then successfully deployed.  She was then removed.  Hemostasis was obtained from manual pressure  Impression:  #1  Successful placement of IVC filter  #2  Patient will be scheduled for removal once he has completed surgical recovery   V. Annamarie Major, M.D.,  [facility] [place] Office: [phone] Pager:  [phone]

## 2020-10-07 ENCOUNTER — Telehealth: Payer: Self-pay

## 2020-10-07 NOTE — Telephone Encounter (Signed)
Patient's wife called to report a small dot of yellowish drainage on IVC insertion site. Denies redness, swelling, fever. Patient is having a THR on Monday. Advised her to call that surgeon and inform him. She will call back if any s/s of infection develop.

## 2020-10-09 DIAGNOSIS — Z20822 Contact with and (suspected) exposure to covid-19: Secondary | ICD-10-CM | POA: Diagnosis not present

## 2020-10-12 DIAGNOSIS — R918 Other nonspecific abnormal finding of lung field: Secondary | ICD-10-CM | POA: Diagnosis not present

## 2020-10-12 DIAGNOSIS — Z96641 Presence of right artificial hip joint: Secondary | ICD-10-CM | POA: Diagnosis not present

## 2020-10-12 DIAGNOSIS — R001 Bradycardia, unspecified: Secondary | ICD-10-CM | POA: Diagnosis not present

## 2020-10-12 DIAGNOSIS — M170 Bilateral primary osteoarthritis of knee: Secondary | ICD-10-CM | POA: Diagnosis not present

## 2020-10-12 DIAGNOSIS — Z471 Aftercare following joint replacement surgery: Secondary | ICD-10-CM | POA: Diagnosis not present

## 2020-10-12 DIAGNOSIS — M1612 Unilateral primary osteoarthritis, left hip: Secondary | ICD-10-CM | POA: Diagnosis not present

## 2020-10-12 DIAGNOSIS — R0600 Dyspnea, unspecified: Secondary | ICD-10-CM | POA: Diagnosis not present

## 2020-10-12 DIAGNOSIS — Z79899 Other long term (current) drug therapy: Secondary | ICD-10-CM | POA: Diagnosis not present

## 2020-10-12 DIAGNOSIS — G4733 Obstructive sleep apnea (adult) (pediatric): Secondary | ICD-10-CM | POA: Diagnosis not present

## 2020-10-12 DIAGNOSIS — G8929 Other chronic pain: Secondary | ICD-10-CM | POA: Diagnosis not present

## 2020-10-12 DIAGNOSIS — E7800 Pure hypercholesterolemia, unspecified: Secondary | ICD-10-CM | POA: Diagnosis not present

## 2020-10-12 DIAGNOSIS — Z86711 Personal history of pulmonary embolism: Secondary | ICD-10-CM | POA: Diagnosis not present

## 2020-10-12 DIAGNOSIS — Z96643 Presence of artificial hip joint, bilateral: Secondary | ICD-10-CM | POA: Diagnosis not present

## 2020-10-12 DIAGNOSIS — Z85828 Personal history of other malignant neoplasm of skin: Secondary | ICD-10-CM | POA: Diagnosis not present

## 2020-10-12 DIAGNOSIS — Z7901 Long term (current) use of anticoagulants: Secondary | ICD-10-CM | POA: Diagnosis not present

## 2020-10-12 DIAGNOSIS — M1A9XX0 Chronic gout, unspecified, without tophus (tophi): Secondary | ICD-10-CM | POA: Diagnosis not present

## 2020-10-12 DIAGNOSIS — Z87898 Personal history of other specified conditions: Secondary | ICD-10-CM | POA: Diagnosis not present

## 2020-10-12 DIAGNOSIS — Z86718 Personal history of other venous thrombosis and embolism: Secondary | ICD-10-CM | POA: Diagnosis not present

## 2020-10-13 DIAGNOSIS — M1612 Unilateral primary osteoarthritis, left hip: Secondary | ICD-10-CM | POA: Diagnosis not present

## 2020-10-13 DIAGNOSIS — E7800 Pure hypercholesterolemia, unspecified: Secondary | ICD-10-CM | POA: Diagnosis not present

## 2020-10-13 DIAGNOSIS — M170 Bilateral primary osteoarthritis of knee: Secondary | ICD-10-CM | POA: Diagnosis not present

## 2020-10-13 DIAGNOSIS — Z86718 Personal history of other venous thrombosis and embolism: Secondary | ICD-10-CM | POA: Diagnosis not present

## 2020-10-13 DIAGNOSIS — G4733 Obstructive sleep apnea (adult) (pediatric): Secondary | ICD-10-CM | POA: Diagnosis not present

## 2020-10-13 DIAGNOSIS — R001 Bradycardia, unspecified: Secondary | ICD-10-CM | POA: Diagnosis not present

## 2020-10-13 DIAGNOSIS — G8929 Other chronic pain: Secondary | ICD-10-CM | POA: Diagnosis not present

## 2020-10-13 DIAGNOSIS — Z87898 Personal history of other specified conditions: Secondary | ICD-10-CM | POA: Diagnosis not present

## 2020-10-13 DIAGNOSIS — M1A9XX0 Chronic gout, unspecified, without tophus (tophi): Secondary | ICD-10-CM | POA: Diagnosis not present

## 2020-10-15 ENCOUNTER — Emergency Department (HOSPITAL_COMMUNITY): Payer: Medicare Other

## 2020-10-15 ENCOUNTER — Encounter (HOSPITAL_COMMUNITY): Payer: Self-pay | Admitting: *Deleted

## 2020-10-15 ENCOUNTER — Other Ambulatory Visit: Payer: Self-pay

## 2020-10-15 ENCOUNTER — Emergency Department (HOSPITAL_COMMUNITY)
Admission: EM | Admit: 2020-10-15 | Discharge: 2020-10-15 | Disposition: A | Discharge status: Home / Self Care | Payer: Medicare Other | Attending: Emergency Medicine | Admitting: Emergency Medicine

## 2020-10-15 DIAGNOSIS — Z87891 Personal history of nicotine dependence: Secondary | ICD-10-CM | POA: Insufficient documentation

## 2020-10-15 DIAGNOSIS — I279 Pulmonary heart disease, unspecified: Secondary | ICD-10-CM | POA: Insufficient documentation

## 2020-10-15 DIAGNOSIS — R531 Weakness: Secondary | ICD-10-CM | POA: Diagnosis not present

## 2020-10-15 DIAGNOSIS — R231 Pallor: Secondary | ICD-10-CM | POA: Diagnosis not present

## 2020-10-15 DIAGNOSIS — I2699 Other pulmonary embolism without acute cor pulmonale: Secondary | ICD-10-CM | POA: Diagnosis not present

## 2020-10-15 DIAGNOSIS — Z85828 Personal history of other malignant neoplasm of skin: Secondary | ICD-10-CM | POA: Diagnosis not present

## 2020-10-15 DIAGNOSIS — R55 Syncope and collapse: Secondary | ICD-10-CM | POA: Diagnosis not present

## 2020-10-15 DIAGNOSIS — Z96641 Presence of right artificial hip joint: Secondary | ICD-10-CM | POA: Diagnosis not present

## 2020-10-15 DIAGNOSIS — R42 Dizziness and giddiness: Secondary | ICD-10-CM | POA: Diagnosis not present

## 2020-10-15 DIAGNOSIS — Z7901 Long term (current) use of anticoagulants: Secondary | ICD-10-CM | POA: Insufficient documentation

## 2020-10-15 DIAGNOSIS — I517 Cardiomegaly: Secondary | ICD-10-CM | POA: Diagnosis not present

## 2020-10-15 DIAGNOSIS — R0902 Hypoxemia: Secondary | ICD-10-CM | POA: Diagnosis not present

## 2020-10-15 LAB — CBC WITH DIFFERENTIAL/PLATELET
Abs Immature Granulocytes: 0.04 10*3/uL (ref 0.00–0.07)
Basophils Absolute: 0.1 10*3/uL (ref 0.0–0.1)
Basophils Relative: 1 %
Eosinophils Absolute: 0.1 10*3/uL (ref 0.0–0.5)
Eosinophils Relative: 1 %
HCT: 37.6 % — ABNORMAL LOW (ref 39.0–52.0)
Hemoglobin: 12.6 g/dL — ABNORMAL LOW (ref 13.0–17.0)
Immature Granulocytes: 0 %
Lymphocytes Relative: 8 %
Lymphs Abs: 0.9 10*3/uL (ref 0.7–4.0)
MCH: 30.2 pg (ref 26.0–34.0)
MCHC: 33.5 g/dL (ref 30.0–36.0)
MCV: 90.2 fL (ref 80.0–100.0)
Monocytes Absolute: 1 10*3/uL (ref 0.1–1.0)
Monocytes Relative: 9 %
Neutro Abs: 9.3 10*3/uL — ABNORMAL HIGH (ref 1.7–7.7)
Neutrophils Relative %: 81 %
Platelets: 174 10*3/uL (ref 150–400)
RBC: 4.17 MIL/uL — ABNORMAL LOW (ref 4.22–5.81)
RDW: 13.2 % (ref 11.5–15.5)
WBC: 11.4 10*3/uL — ABNORMAL HIGH (ref 4.0–10.5)
nRBC: 0 % (ref 0.0–0.2)

## 2020-10-15 LAB — TROPONIN I (HIGH SENSITIVITY)
Troponin I (High Sensitivity): 17 ng/L (ref <18)
Troponin I (High Sensitivity): 14 ng/L (ref <18)

## 2020-10-15 LAB — BASIC METABOLIC PANEL
Anion gap: 11 (ref 5–15)
BUN: 12 mg/dL (ref 8–23)
CO2: 23 mmol/L (ref 22–32)
Calcium: 8.5 mg/dL — ABNORMAL LOW (ref 8.9–10.3)
Chloride: 104 mmol/L (ref 98–111)
Creatinine, Ser: 0.84 mg/dL (ref 0.61–1.24)
GFR, Estimated: >60 mL/min (ref >60)
Glucose, Bld: 159 mg/dL — ABNORMAL HIGH (ref 70–99)
Potassium: 4 mmol/L (ref 3.5–5.1)
Sodium: 138 mmol/L (ref 135–145)

## 2020-10-15 MED ORDER — SODIUM CHLORIDE 0.9 % IV BOLUS
500.0000 mL | Freq: Once | INTRAVENOUS | Status: AC
  Administered 2020-10-15: 500 mL via INTRAVENOUS

## 2020-10-15 MED ORDER — IOHEXOL 350 MG/ML SOLN
70.0000 mL | Freq: Once | INTRAVENOUS | Status: AC | PRN
  Administered 2020-10-15: 70 mL via INTRAVENOUS

## 2020-10-15 NOTE — ED Notes (Signed)
Pt tolerated walking with crutches and standby assistance. Pt's wife at bedside and will take him home. Paperwork given to pt's wife and d/c instructions. Esignature pad not working.

## 2020-10-15 NOTE — ED Triage Notes (Signed)
Patient presents to ed via [facility] from home  States he had left total hip on Monday at [facility] and was discharged home on Tues. States he was fine last pm when going to bed. This am was going to the br and felt very weak had a syncopal episode , family did 1 minute of CPR on patient upon ems arrival patient was  Alert oriented. Upon arrival patient alert denies pain, states he had his right hip replaced in 2017 and the same thing happened.

## 2020-10-15 NOTE — ED Notes (Signed)
Pt taken to CT.

## 2020-10-15 NOTE — ED Provider Notes (Signed)
[facility] EMERGENCY DEPARTMENT Provider Note   CSN: [id number] Arrival date & time: 10/15/20  9767     History Chief Complaint  Patient presents with  . Altered Mental Status  . Near Syncope    Joe Davis is a 75 y.o. male w/ hx of DVT on xarelto, IVC placement, left hip replacement on 10/12/20, presenting to the ED with near syncope.  Reports he woke up this morning went to bathroom, was feeling woozy and lightheaded, his nurse helped him sit down, then he had LOC.  Ems reports nurse performed about 1 minute CPR - unclear if he truly lost pulses or not.  He was awake on their arrival.  Update -his fiance arrives and provides further history.  She states he has been doing well after surgery.  However last night they received a fright when there were bright light shining in their window from a car passing by.  She said it was very stressful.  The patient then was using the bathroom and called out that he felt dizzy and lightheaded.  She rushed over and placed a commode under him and helped him sit down.  However he appeared to become unresponsive.  She helped him to the floor.  There was no trauma.  She checked his pulse could not feel a pulse.  However she says she do not check for long.  She then began a few seconds of CPR until he started moaning. She rechecked his pulse and found was bradycardic in the 30s.  She reports that he has had an issue with bradycardia in the past his resting heart rate tends to be in the 50s.  The cardiologist is [name], who wanted the placed on a Holter monitor few years ago but he refused at that time.  Pt reports similar episode occurred years ago with his right hip replacement.  He held his xarelto during his surgery and just restarted it yesterday.    Currently denies headache, CP, SOB, hip pain.  Feels normal.   HPI     Past Medical History:  Diagnosis Date  . Arthritis    "knees, hips" (10/10/2014)  . Basal cell carcinoma    . BPH (benign prostatic hyperplasia)   . Bradycardia vasovagal response 10 yrs ago   asymptomatic  . Diastolic dysfunction    Per echo in 2008  . DVT (deep venous thrombosis) ([place]) 1979   RLE  . Erectile dysfunction   . History of gout   . History of nuclear stress test    ETT-Myoview 3/17: Hypertensive blood pressure response to exercise, normal perfusion, EF 60%, low risk study  . Hypercholesterolemia    ? statin intolerant. May tolerate Lipitor  . Normal nuclear stress test May 2011  . Obesity   . Pulmonary embolism ([place]) 1979  . Syncope and collapse 8-10 yrs ago    Patient Active Problem List   Diagnosis Date Noted  . Lower leg DVT (deep venous thromboembolism), acute, left ([place]) 03/18/2020  . Pulmonary nodules/lesions, multiple 02/06/2020  . Back pain 08/11/2015  . Low back pain 08/11/2015  . Left-sided low back pain with left-sided sciatica   . Prediabetes 07/28/2015  . BPH (benign prostatic hyperplasia) 01/22/2015  . History of hip surgery 10/05/14 10/11/2014  . Diastolic dysfunction 34/19/3790  . Syncope and collapse 10/10/2014  . Faintness   . Gout 10/03/2013  . Acute medial meniscal tear 10/03/2012  . Palpitation 05/03/2011  . Atypical chest pain 05/03/2011  .  Hypercholesterolemia 01/26/2011  . BACK PAIN 01/23/2007  . Pain in limb 01/23/2007  . SKIN CANCER, HX OF 01/23/2007  . Remote history of venous thrombosis and embolism 01/23/2007    Past Surgical History:  Procedure Laterality Date  . CARDIOVASCULAR STRESS TEST  May 2011   EF 73%; No ischemia  . COLONOSCOPY  2013  . IVC FILTER INSERTION N/A 10/06/2020   Procedure: IVC FILTER INSERTION;  Surgeon: Serafina Mitchell, MD;  Location: [facility];  Service: Cardiovascular;  Laterality: N/A;  w/ IVC Venogram  . JOINT REPLACEMENT    . KNEE ARTHROSCOPY Right 10/03/2012   Procedure: RIGHT KNEE ARTHROSCOPY WITH DEBRIDEMENT;  Surgeon: [name], MD;  Location: [facility];  Service: Orthopedics;   Laterality: Right;  WITH DEBRIDEMENT  . MOHS SURGERY  6 yrs    nose  . TOTAL HIP ARTHROPLASTY Right 10/06/2014       Family History  Adopted: Yes  Family history unknown: Yes    Social History   Tobacco Use  . Smoking status: Former Smoker    Packs/day: 1.00    Years: 10.00    Pack years: 10.00    Types: Cigarettes    Quit date: 08/15/1977    Years since quitting: 43.1  . Smokeless tobacco: Never Used  Vaping Use  . Vaping Use: Never used  Substance Use Topics  . Alcohol use: Yes    Alcohol/week: 7.0 standard drinks    Types: 7 Shots of liquor per week  . Drug use: No    Home Medications Prior to Admission medications   Medication Sig Start Date End Date Taking? Authorizing Provider  acetaminophen (TYLENOL) 500 MG tablet Take 1,000 mg by mouth 2 (two) times daily.    [provider]  lidocaine (LIDODERM) 5 % Place 1 patch onto the skin daily as needed (joint pain). Remove & Discard patch within 12 hours or as directed by MD    [provider]  rivaroxaban (XARELTO) 20 MG TABS tablet Take 1 tablet (20 mg total) by mouth daily with supper. 05/13/20   Burchette, Alinda Sierras, MD  tadalafil (CIALIS) 5 MG tablet 5 mg daily as needed for erectile dysfunction. 11/01/17   [provider]  traMADol (ULTRAM) 50 MG tablet Take 50 mg by mouth every 4 (four) hours as needed for pain. 10/13/20   [provider]  XARELTO 10 MG TABS tablet Take 10 mg by mouth daily. 10/13/20   [provider]    Allergies    Patient has no known allergies.  Review of Systems   Review of Systems  Constitutional: Negative for chills and fever.  HENT: Negative for ear pain and sore throat.   Eyes: Negative for pain and visual disturbance.  Respiratory: Negative for cough and shortness of breath.   Cardiovascular: Negative for chest pain and palpitations.  Gastrointestinal: Negative for abdominal pain and vomiting.  Genitourinary: Negative for dysuria and hematuria.   Musculoskeletal: Negative for arthralgias and back pain.  Skin: Negative for color change and rash.  Neurological: Positive for syncope and light-headedness. Negative for weakness and numbness.  All other systems reviewed and are negative.   Physical Exam Updated Vital Signs BP 126/69   Pulse 64   Temp 98 F (36.7 C) (Oral)   Resp (!) 21   Ht 5' 10.5" (1.791 m)   Wt 98.4 kg   SpO2 96%   BMI 30.70 kg/m   Physical Exam Constitutional:      General:  He is not in acute distress.    Appearance: He is obese.  HENT:     Head: Normocephalic and atraumatic.  Eyes:     Conjunctiva/sclera: Conjunctivae normal.     Pupils: Pupils are equal, round, and reactive to light.  Cardiovascular:     Rate and Rhythm: Normal rate and regular rhythm.     Pulses: Normal pulses.  Pulmonary:     Effort: Pulmonary effort is normal. No respiratory distress.  Abdominal:     General: There is no distension.     Tenderness: There is no abdominal tenderness.  Skin:    General: Skin is warm and dry.     Comments: Left hip incision clean, intact  Neurological:     General: No focal deficit present.     Mental Status: He is alert. Mental status is at baseline.  Psychiatric:        Mood and Affect: Mood normal.        Behavior: Behavior normal.     ED Results / Procedures / Treatments   Labs (all labs ordered are listed, but only abnormal results are displayed) Labs Reviewed  BASIC METABOLIC PANEL - Abnormal; Notable for the following components:      Result Value   Glucose, Bld 159 (*)    Calcium 8.5 (*)    All other components within normal limits  CBC WITH DIFFERENTIAL/PLATELET - Abnormal; Notable for the following components:   WBC 11.4 (*)    RBC 4.17 (*)    Hemoglobin 12.6 (*)    HCT 37.6 (*)    Neutro Abs 9.3 (*)    All other components within normal limits  TROPONIN I (HIGH SENSITIVITY)  TROPONIN I (HIGH SENSITIVITY)    EKG EKG Interpretation  Date/Time:  Thursday October 15 2020 09:06:25 EST Ventricular Rate:  66 PR Interval:    QRS Duration: 111 QT Interval:  393 QTC Calculation: 412 R Axis:   -42 Text Interpretation: Sinus rhythm Left anterior fascicular block Abnormal R-wave progression, late transition No STEMI Confirmed by Octaviano Glow [id number]) on 10/15/2020 9:10:32 AM   Radiology CT Angio Chest PE W and/or Wo Contrast  Result Date: 10/15/2020 CLINICAL DATA:  PE suspected, high prob hx of dvt, stopped xarelto for hip surgery 5 days ago, here with syncopal episode, evaluate for PE EXAM: CT ANGIOGRAPHY CHEST WITH CONTRAST TECHNIQUE: Multidetector CT imaging of the chest was performed using the standard protocol during bolus administration of intravenous contrast. Multiplanar CT image reconstructions and MIPs were obtained to evaluate the vascular anatomy. CONTRAST:  4mL OMNIPAQUE IOHEXOL 350 MG/ML SOLN COMPARISON:  Chest CT June 15, 2020. FINDINGS: Cardiovascular: Satisfactory opacification of the pulmonary arteries to the segmental level. No evidence of pulmonary embolism. Aortic atherosclerosis. No thoracic aortic aneurysm. Normal heart size. No pericardial effusion. Mediastinum/Nodes: No enlarged mediastinal, hilar, or axillary lymph nodes. Thyroid gland, trachea, and esophagus demonstrate no significant findings. Lungs/Pleura: Lungs are clear. No pleural effusion or pneumothorax. Upper Abdomen: No acute abnormality. Musculoskeletal: Multilevel degenerative changes spine. No chest wall abnormality. No acute or significant osseous findings. Review of the MIP images confirms the above findings. IMPRESSION: 1. Negative examination for pulmonary embolism. No acute cardiopulmonary disease. 2. Aortic atherosclerosis.  Aortic Atherosclerosis (ICD10-I70.0). Electronically Signed   By: Dahlia Bailiff MD   On: 10/15/2020 12:35   DG Chest Portable 1 View  Result Date: 10/15/2020 CLINICAL DATA:  Syncope EXAM: PORTABLE CHEST 1 VIEW COMPARISON:  03/07/2018 FINDINGS: Heart is  mildly enlarged. Blunting  of the right costophrenic angle, likely scarring. No confluent opacities or effusions. No acute bony abnormality. IMPRESSION: Right basilar scarring.  No active disease. Mild cardiomegaly. Electronically Signed   By: Rolm Baptise M.D.   On: 10/15/2020 09:49    Procedures Procedures   Medications Ordered in ED Medications  sodium chloride 0.9 % bolus 500 mL (0 mLs Intravenous Stopped 10/15/20 1045)  iohexol (OMNIPAQUE) 350 MG/ML injection 70 mL (70 mLs Intravenous Contrast Given 10/15/20 1207)    ED Course  I have reviewed the triage vital signs and the nursing notes.  Pertinent labs & imaging results that were available during my care of the patient were reviewed by me and considered in my medical decision making (see chart for details).  75 yo male here for syncope, weakness this morning while using the bathroom  From the history provided by his fiance this is most consistent with a vagal episode.  He has a hx of syncope during stressful events and was using the bathroom at the time.  He reportedly was bradycardic to HR 30's by her check but has sinus bradycardia HR 50's here, which she states is normal for him.  He is asymptomatic in the ED   DDx includes PE vs anemia vs arrhythmia vs ACS vs infection vs anesthesia side effects vs other  High risk for PE given he's been off xarelto and had a recent major surgery.  He has hx of DVT.  No active signs or symptoms of DVT.  Vitals wnl.  However we'll likely need to obtain a CT PE.   Labs ordered and reviewed showing trop 17 -> 14 (flat), BMP unremarkable, Hgb 12.6, WBC 11.4,  Imaging ordered including CT PE, reviewed, no evident PE or pneumonia  He does not appear confused to me, or show any evidence of head trauma.   ECG personally reviewed showing NSR, no acute ischemic findings, no heart block  Clinical Course as of 10/15/20 1720  Thu Oct 15, 2020  1246 IMPRESSION: 1. Negative examination for pulmonary  embolism. No acute cardiopulmonary disease. 2. Aortic atherosclerosis. Aortic Atherosclerosis (ICD10-I70.0).    [MT]  7078 2nd trop in process [MT]  1350 2nd trop is flat.  Pt's fiance now at bedside.  She confirms he has had multiple episodes similar to this in the past usually involving stressful events. This sounds like a vagal episode.  I advised they call Dr [name] cardiology's office tomorrow to arrange for office f/u. [MT]    Clinical Course User Index [MT] Nachelle Negrette, Carola Rhine, MD    Final Clinical Impression(s) / ED Diagnoses Final diagnoses:  Vasovagal syncope    Rx / DC Orders ED Discharge Orders    None       Jemina Scahill, Carola Rhine, MD 10/15/20 [phone]

## 2020-10-15 NOTE — Discharge Instructions (Addendum)
Remember to drink plenty of water and rest for the next few days.  Use your crutches whenever getting up, and keep yourself in your somewhere we can sit down.  If you begin to feel lightheaded sit down or try to lay down right away.  If you fall and hit your head, you should come back to the emergency department.

## 2020-10-18 DIAGNOSIS — M109 Gout, unspecified: Secondary | ICD-10-CM | POA: Diagnosis not present

## 2020-10-18 DIAGNOSIS — G4733 Obstructive sleep apnea (adult) (pediatric): Secondary | ICD-10-CM | POA: Diagnosis not present

## 2020-10-18 DIAGNOSIS — R55 Syncope and collapse: Secondary | ICD-10-CM | POA: Diagnosis not present

## 2020-10-18 DIAGNOSIS — Z86711 Personal history of pulmonary embolism: Secondary | ICD-10-CM | POA: Diagnosis not present

## 2020-10-18 DIAGNOSIS — E7800 Pure hypercholesterolemia, unspecified: Secondary | ICD-10-CM | POA: Diagnosis not present

## 2020-10-18 DIAGNOSIS — N400 Enlarged prostate without lower urinary tract symptoms: Secondary | ICD-10-CM | POA: Diagnosis not present

## 2020-10-18 DIAGNOSIS — Z87891 Personal history of nicotine dependence: Secondary | ICD-10-CM | POA: Diagnosis not present

## 2020-10-18 DIAGNOSIS — R001 Bradycardia, unspecified: Secondary | ICD-10-CM | POA: Diagnosis not present

## 2020-10-18 DIAGNOSIS — M545 Low back pain, unspecified: Secondary | ICD-10-CM | POA: Diagnosis not present

## 2020-10-18 DIAGNOSIS — Z86718 Personal history of other venous thrombosis and embolism: Secondary | ICD-10-CM | POA: Diagnosis not present

## 2020-10-18 DIAGNOSIS — Z471 Aftercare following joint replacement surgery: Secondary | ICD-10-CM | POA: Diagnosis not present

## 2020-10-18 DIAGNOSIS — M170 Bilateral primary osteoarthritis of knee: Secondary | ICD-10-CM | POA: Diagnosis not present

## 2020-10-18 DIAGNOSIS — Z96642 Presence of left artificial hip joint: Secondary | ICD-10-CM | POA: Diagnosis not present

## 2020-10-18 DIAGNOSIS — Z96641 Presence of right artificial hip joint: Secondary | ICD-10-CM | POA: Diagnosis not present

## 2020-10-18 DIAGNOSIS — Z95828 Presence of other vascular implants and grafts: Secondary | ICD-10-CM | POA: Diagnosis not present

## 2020-10-20 ENCOUNTER — Telehealth: Payer: Self-pay | Admitting: Cardiovascular Disease

## 2020-10-20 DIAGNOSIS — R55 Syncope and collapse: Secondary | ICD-10-CM

## 2020-10-20 NOTE — Telephone Encounter (Signed)
Spoke to [place], informed that [name] and his nurse will need to discuss this. Joe Davis reports that this same occurrence happened after his last total hip replacement.  She reports she is the one who performed CPR on pt b/c she could not feel a pulse. She states pt is very immobile at this time and worried about trying to get him in to see [place].  Aware that [name] may want a monitor prior to appt to determine if abnormal rhythm is causative factor in this issue.  (she thinks pt will need at least a 2 week - 30 day monitor) Aware will forward to his RN to discuss w/ him and she will call with recommendation/advisement. She appreciates my return call

## 2020-10-20 NOTE — Telephone Encounter (Signed)
Arville Go is calling wanting to know if Joe Davis can now wear the heart monitor he was supposed to wear in the past, but didn't due to a family emergency. She states they are now wanting him to wear it due to his recent bradyarrhythmia episode he had on 10/13/20 that he was hospitalized for. She was unsure if an appointment should be scheduled first. Please advise.

## 2020-10-21 ENCOUNTER — Ambulatory Visit (INDEPENDENT_AMBULATORY_CARE_PROVIDER_SITE_OTHER): Payer: Medicare Other

## 2020-10-21 ENCOUNTER — Encounter: Payer: Self-pay | Admitting: Radiology

## 2020-10-21 DIAGNOSIS — R55 Syncope and collapse: Secondary | ICD-10-CM

## 2020-10-21 NOTE — Telephone Encounter (Signed)
Called patient's partner and informed her of Dr. Marlou Porch recommendations. Patient's partner agreed to plan. Placed order for monitor.

## 2020-10-21 NOTE — Telephone Encounter (Signed)
Called patient back about his message. Talked to him and his partner. They want patient to have a monitor due to patient's having recent syncopal episode. Will reach out to providers that are covering [name] patients while he is out, to see if we can order monitor. Made patient an appointment with [name] on 11/05/20, patient is over due for 6 month follow up.

## 2020-10-21 NOTE — Progress Notes (Unsigned)
Enrolled patient for a 14 day Zio AT Monitor to be mailed to patients home.  

## 2020-10-21 NOTE — Telephone Encounter (Signed)
Okay to order a Zio patch monitor XT for 2 weeks. Syncope Candee Furbish, MD

## 2020-10-22 DIAGNOSIS — R001 Bradycardia, unspecified: Secondary | ICD-10-CM | POA: Diagnosis not present

## 2020-10-22 DIAGNOSIS — M170 Bilateral primary osteoarthritis of knee: Secondary | ICD-10-CM | POA: Diagnosis not present

## 2020-10-22 DIAGNOSIS — Z471 Aftercare following joint replacement surgery: Secondary | ICD-10-CM | POA: Diagnosis not present

## 2020-10-22 DIAGNOSIS — M545 Low back pain, unspecified: Secondary | ICD-10-CM | POA: Diagnosis not present

## 2020-10-22 DIAGNOSIS — Z96642 Presence of left artificial hip joint: Secondary | ICD-10-CM | POA: Diagnosis not present

## 2020-10-22 DIAGNOSIS — R55 Syncope and collapse: Secondary | ICD-10-CM | POA: Diagnosis not present

## 2020-10-23 DIAGNOSIS — R55 Syncope and collapse: Secondary | ICD-10-CM

## 2020-10-24 DIAGNOSIS — R55 Syncope and collapse: Secondary | ICD-10-CM | POA: Diagnosis not present

## 2020-10-25 NOTE — Progress Notes (Incomplete)
Date:  10/25/2020   ID:  Joe Davis, DOB [birth year], MRN [id number]  PCP:  [name], MD  Cardiologist:  [name]    History of Present Illness:   Joe Davis is a 75 y.o. male with hx of OSA on CPAP, remote DVT and PE and HLD  Prior hx of dyspnea. Echo 09/2014 with LVEF of 55-65% and grade 1 DD. Exercise myoview 10/2015 was low risk stress nuclear study with normal perfusion and normal left ventricular regional and global systolic function. Hypertensive response to exercise.  Went to ER 09/18/19 after syncopal episode. Workup reassuring - reviewed personally. His symptoms felt possible vasovagal. Discharged from ER.   Monitor and echo ordered Patient cancelled monitor TTE reviewed 09/26/19 normal EF 55-60% no significant valve disease and normal RV with no signs of pulmnary HTN.  Trivial AR and AV sclerosis   Calcium score 01/03/20 very high 1657 involving 3 vessels  F/U myovue 01/08/20 normal with no ischemia and EF 55%  He had RLE DVT in 77 with PE Rx with coumadin for a time. Not known to be hypercoagulable Had a LLE DVT in August  Extensive involving the CFV, FV, PV, PTV, PV and gastocnemius veins  Seen by [facility] Dr Trula Slade recommended tetrievable IVC filter prior to having left hip surgery at [facility] and was on Xarelto Filter placed on 10/06/20   Left hip replacement done 10/12/20 Had bradycardia with spinal anesthesia requiring atropine Baseline HR 50's D/C on 10 mg Xarelto and to start 20 mg 10/19/20     Seen in ED 10/15/20 for AMS and near syncope Got scared night before event bright light from car passing window Got up in am to use bathroom and felt dizzy/lightheaded ? Loss of pulse with HR in 30's In ER VSS pulse 64 Thought to be vagal episode Troponin negative CTA no PE ECG no heart block and telemetry normal in ER   Monitor placed 10/21/20   ***  Past Medical History:  Diagnosis Date  . Arthritis    "knees, hips" (10/10/2014)  . Basal cell carcinoma   . BPH (benign  prostatic hyperplasia)   . Bradycardia vasovagal response 10 yrs ago   asymptomatic  . Diastolic dysfunction    Per echo in 2008  . DVT (deep venous thrombosis) ([place]) 1979   RLE  . Erectile dysfunction   . History of gout   . History of nuclear stress test    ETT-Myoview 3/17: Hypertensive blood pressure response to exercise, normal perfusion, EF 60%, low risk study  . Hypercholesterolemia    ? statin intolerant. May tolerate Lipitor  . Normal nuclear stress test May 2011  . Obesity   . Pulmonary embolism ([place]) 1979  . Syncope and collapse 8-10 yrs ago    Past Surgical History:  Procedure Laterality Date  . CARDIOVASCULAR STRESS TEST  May 2011   EF 73%; No ischemia  . COLONOSCOPY  2013  . IVC FILTER INSERTION N/A 10/06/2020   Procedure: IVC FILTER INSERTION;  Surgeon: Serafina Mitchell, MD;  Location: [facility];  Service: Cardiovascular;  Laterality: N/A;  w/ IVC Venogram  . JOINT REPLACEMENT    . KNEE ARTHROSCOPY Right 10/03/2012   Procedure: RIGHT KNEE ARTHROSCOPY WITH DEBRIDEMENT;  Surgeon: [name], MD;  Location: [facility];  Service: Orthopedics;  Laterality: Right;  WITH DEBRIDEMENT  . MOHS SURGERY  6 yrs    nose  . TOTAL HIP ARTHROPLASTY Right 10/06/2014  Current Medications:  Prior to Admission medications   Medication Sig Start Date End Date Taking? Authorizing Provider  aspirin 81 MG tablet Take 162 mg by mouth every morning.    Yes [provider]  atorvastatin (LIPITOR) 10 MG tablet Take 1 tablet (10 mg total) by mouth daily. Please call and schedule an appointment for further refills 1st attempt 03/29/18  Yes Josue Hector, MD  tadalafil (CIALIS) 5 MG tablet TK 1  T PO D UTD 11/01/17  Yes [provider]  timolol (TIMOPTIC) 0.5 % ophthalmic solution PLACE 1 GTT IN OU BID 05/15/17  Yes [provider]    Allergies:   Patient has no known allergies.   Social History   Socioeconomic History  . Marital status: Divorced     Spouse name: Not on file  . Number of children: Not on file  . Years of education: Not on file  . Highest education level: Not on file  Occupational History  . Not on file  Tobacco Use  . Smoking status: Former Smoker    Packs/day: 1.00    Years: 10.00    Pack years: 10.00    Types: Cigarettes    Quit date: 08/15/1977    Years since quitting: 43.2  . Smokeless tobacco: Never Used  Vaping Use  . Vaping Use: Never used  Substance and Sexual Activity  . Alcohol use: Yes    Alcohol/week: 7.0 standard drinks    Types: 7 Shots of liquor per week  . Drug use: No  . Sexual activity: Yes  Other Topics Concern  . Not on file  Social History Narrative  . Not on file   Social Determinants of Health   Financial Resource Strain: Not on file  Food Insecurity: Not on file  Transportation Needs: Not on file  Physical Activity: Not on file  Stress: Not on file  Social Connections: Not on file     Family History:  The patient's He was adopted. Family history is unknown by patient.  ROS:   Please see the history of present illness.    ROS All other systems reviewed and are negative.   PHYSICAL EXAM:   VS:  There were no vitals taken for this visit.    Affect appropriate Chronically ill male  HEENT: normal Neck supple with no adenopathy JVP normal no bruits no thyromegaly Lungs clear with no wheezing and good diaphragmatic motion Heart:  S1/S2 no murmur, no rub, gallop or click PMI normal Abdomen: benighn, BS positve, no tenderness, no AAA no bruit.  No HSM or HJR Distal pulses intact with no bruits Post recent left THR  ***   Wt Readings from Last 3 Encounters:  10/15/20 98.4 kg  10/06/20 98.4 kg  08/24/20 98.4 kg      Studies/Labs Reviewed:   EKG:   10/15/20 SR rate 66  LAFB normal QT 427  Recent Labs: 01/03/2020: ALT 18 10/15/2020: BUN 12; Creatinine, Ser 0.84; Hemoglobin 12.6; Platelets 174; Potassium 4.0; Sodium 138   Lipid Panel    Component Value Date/Time    CHOL 192 01/03/2020 1034   TRIG 70 01/03/2020 1034   HDL 50 01/03/2020 1034   CHOLHDL 3.8 01/03/2020 1034   CHOLHDL 3.4 12/15/2015 0802   VLDL 17 12/15/2015 0802   LDLCALC 129 (H) 01/03/2020 1034   LDLDIRECT 125.7 09/25/2012 0914    Additional studies/ records that were reviewed today include:   TTE 09/26/19  ASSESSMENT & PLAN:    1. Syncope :  normal echo ? Vasovagal. 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  DVT: PE IVC filter 10/06/20 on Xarelto ***  3.  HLD- On lipitor 10mg  qd. Followed by PCP.     4. CAD: high calcium score myovue normal 01/08/20 on statin   Medication Adjustments/Labs and Tests Ordered: Current medicines are reviewed at length with the patient today.  Concerns regarding medicines are outlined above.  Medication changes, Labs and Tests ordered today are listed in the Patient Instructions below. There are no Patient Instructions on file for this visit.   Signed, Jenkins Rouge, MD  10/25/2020 11:40 AM    [facility] [address], [place], [place]  [address] Phone: [phone]; Fax: [phone]

## 2020-10-26 DIAGNOSIS — M545 Low back pain, unspecified: Secondary | ICD-10-CM | POA: Diagnosis not present

## 2020-10-26 DIAGNOSIS — Z96642 Presence of left artificial hip joint: Secondary | ICD-10-CM | POA: Diagnosis not present

## 2020-10-26 DIAGNOSIS — R55 Syncope and collapse: Secondary | ICD-10-CM | POA: Diagnosis not present

## 2020-10-26 DIAGNOSIS — Z471 Aftercare following joint replacement surgery: Secondary | ICD-10-CM | POA: Diagnosis not present

## 2020-10-26 DIAGNOSIS — M170 Bilateral primary osteoarthritis of knee: Secondary | ICD-10-CM | POA: Diagnosis not present

## 2020-10-26 DIAGNOSIS — R001 Bradycardia, unspecified: Secondary | ICD-10-CM | POA: Diagnosis not present

## 2020-10-28 DIAGNOSIS — Z471 Aftercare following joint replacement surgery: Secondary | ICD-10-CM | POA: Diagnosis not present

## 2020-10-28 DIAGNOSIS — Z96642 Presence of left artificial hip joint: Secondary | ICD-10-CM | POA: Diagnosis not present

## 2020-10-29 DIAGNOSIS — Z96642 Presence of left artificial hip joint: Secondary | ICD-10-CM | POA: Diagnosis not present

## 2020-10-29 DIAGNOSIS — R001 Bradycardia, unspecified: Secondary | ICD-10-CM | POA: Diagnosis not present

## 2020-10-29 DIAGNOSIS — R55 Syncope and collapse: Secondary | ICD-10-CM | POA: Diagnosis not present

## 2020-10-29 DIAGNOSIS — Z471 Aftercare following joint replacement surgery: Secondary | ICD-10-CM | POA: Diagnosis not present

## 2020-10-29 DIAGNOSIS — M545 Low back pain, unspecified: Secondary | ICD-10-CM | POA: Diagnosis not present

## 2020-10-29 DIAGNOSIS — M170 Bilateral primary osteoarthritis of knee: Secondary | ICD-10-CM | POA: Diagnosis not present

## 2020-11-05 ENCOUNTER — Ambulatory Visit: Payer: Medicare Other | Admitting: Cardiovascular Disease

## 2020-11-05 ENCOUNTER — Telehealth: Payer: Self-pay | Admitting: Cardiovascular Disease

## 2020-11-05 NOTE — Telephone Encounter (Signed)
Patient states he thought the appointment was virtual today. He states he called 3 times and confirmed the appointment was virtual. He would like to speak with Dr. Kyla Balzarine nurse. Tomorrow is the last day for wearing his monitor so he will also be removing it ans sending it back.

## 2020-11-05 NOTE — Telephone Encounter (Signed)
Called patient back. Patient is not done wearing monitor. Patient will wait for monitor results, and then schedule appointment.

## 2020-11-11 ENCOUNTER — Ambulatory Visit: Payer: Medicare Other

## 2020-11-12 ENCOUNTER — Other Ambulatory Visit: Payer: Self-pay

## 2020-11-17 ENCOUNTER — Telehealth: Payer: Self-pay

## 2020-11-17 NOTE — Telephone Encounter (Signed)
Patient wanted to know if they could cancel ultrasound appt in favor of scheduling IVC filter removal. Per Dr. Trula Slade, they need to keep f/u and ultrasound prior to scheduling surgery. Patient aware.

## 2020-11-18 ENCOUNTER — Other Ambulatory Visit: Payer: Self-pay

## 2020-11-18 ENCOUNTER — Ambulatory Visit (INDEPENDENT_AMBULATORY_CARE_PROVIDER_SITE_OTHER): Payer: Medicare Other

## 2020-11-18 DIAGNOSIS — Z96642 Presence of left artificial hip joint: Secondary | ICD-10-CM | POA: Diagnosis not present

## 2020-11-18 DIAGNOSIS — I824Z2 Acute embolism and thrombosis of unspecified deep veins of left distal lower extremity: Secondary | ICD-10-CM

## 2020-11-18 DIAGNOSIS — Z471 Aftercare following joint replacement surgery: Secondary | ICD-10-CM | POA: Diagnosis not present

## 2020-11-18 DIAGNOSIS — Z0000 Encounter for general adult medical examination without abnormal findings: Secondary | ICD-10-CM | POA: Diagnosis not present

## 2020-11-18 NOTE — Patient Instructions (Addendum)
Joe Davis , Thank you for taking time to come for your Medicare Wellness Visit. I appreciate your ongoing commitment to your health goals. Please review the following plan we discussed and let me know if I can assist you in the future.   Screening recommendations/referrals: Colonoscopy: No longer required Recommended yearly ophthalmology/optometry visit for glaucoma screening and checkup Recommended yearly dental visit for hygiene and checkup  Vaccinations: Influenza vaccine: Up to date Pneumococcal vaccine: Up to date Tdap vaccine: Up to date Shingles vaccine: Shingrix discussed. Please contact your pharmacy for coverage information.    Covid-19: Completed 1/13, 2/2, & 05/11/20  Advanced directives: Please bring a copy of your health care power of attorney and living will to the office at your convenience.  Conditions/risks identified: Lose weight   Next appointment: Follow up in one year for your annual wellness visit.   Preventive Care 77 Years and Older, Male Preventive care refers to lifestyle choices and visits with your health care provider that can promote health and wellness. What does preventive care include?  A yearly physical exam. This is also called an annual well check.  Dental exams once or twice a year.  Routine eye exams. Ask your health care provider how often you should have your eyes checked.  Personal lifestyle choices, including:  Daily care of your teeth and gums.  Regular physical activity.  Eating a healthy diet.  Avoiding tobacco and drug use.  Limiting alcohol use.  Practicing safe sex.  Taking low doses of aspirin every day.  Taking vitamin and mineral supplements as recommended by your health care provider. What happens during an annual well check? The services and screenings done by your health care provider during your annual well check will depend on your age, overall health, lifestyle risk factors, and family history of  disease. Counseling  Your health care provider may ask you questions about your:  Alcohol use.  Tobacco use.  Drug use.  Emotional well-being.  Home and relationship well-being.  Sexual activity.  Eating habits.  History of falls.  Memory and ability to understand (cognition).  Work and work Statistician. Screening  You may have the following tests or measurements:  Height, weight, and BMI.  Blood pressure.  Lipid and cholesterol levels. These may be checked every 5 years, or more frequently if you are over 66 years old.  Skin check.  Lung cancer screening. You may have this screening every year starting at age 74 if you have a 30-pack-year history of smoking and currently smoke or have quit within the past 15 years.  Fecal occult blood test (FOBT) of the stool. You may have this test every year starting at age 33.  Flexible sigmoidoscopy or colonoscopy. You may have a sigmoidoscopy every 5 years or a colonoscopy every 10 years starting at age 70.  Prostate cancer screening. Recommendations will vary depending on your family history and other risks.  Hepatitis C blood test.  Hepatitis B blood test.  Sexually transmitted disease (STD) testing.  Diabetes screening. This is done by checking your blood sugar (glucose) after you have not eaten for a while (fasting). You may have this done every 1-3 years.  Abdominal aortic aneurysm (AAA) screening. You may need this if you are a current or former smoker.  Osteoporosis. You may be screened starting at age 68 if you are at high risk. Talk with your health care provider about your test results, treatment options, and if necessary, the need for more tests. Vaccines  Your health care provider may recommend certain vaccines, such as:  Influenza vaccine. This is recommended every year.  Tetanus, diphtheria, and acellular pertussis (Tdap, Td) vaccine. You may need a Td booster every 10 years.  Zoster vaccine. You may  need this after age 54.  Pneumococcal 13-valent conjugate (PCV13) vaccine. One dose is recommended after age 45.  Pneumococcal polysaccharide (PPSV23) vaccine. One dose is recommended after age 101. Talk to your health care provider about which screenings and vaccines you need and how often you need them. This information is not intended to replace advice given to you by your health care provider. Make sure you discuss any questions you have with your health care provider. Document Released: 08/28/2015 Document Revised: 04/20/2016 Document Reviewed: 06/02/2015 Elsevier Interactive Patient Education  2017 [place] Prevention in the Home Falls can cause injuries. They can happen to people of all ages. There are many things you can do to make your home safe and to help prevent falls. What can I do on the outside of my home?  Regularly fix the edges of walkways and driveways and fix any cracks.  Remove anything that might make you trip as you walk through a door, such as a raised step or threshold.  Trim any bushes or trees on the path to your home.  Use bright outdoor lighting.  Clear any walking paths of anything that might make someone trip, such as rocks or tools.  Regularly check to see if handrails are loose or broken. Make sure that both sides of any steps have handrails.  Any raised decks and porches should have guardrails on the edges.  Have any leaves, snow, or ice cleared regularly.  Use sand or salt on walking paths during winter.  Clean up any spills in your garage right away. This includes oil or grease spills. What can I do in the bathroom?  Use night lights.  Install grab bars by the toilet and in the tub and shower. Do not use towel bars as grab bars.  Use non-skid mats or decals in the tub or shower.  If you need to sit down in the shower, use a plastic, non-slip stool.  Keep the floor dry. Clean up any water that spills on the floor as soon as it  happens.  Remove soap buildup in the tub or shower regularly.  Attach bath mats securely with double-sided non-slip rug tape.  Do not have throw rugs and other things on the floor that can make you trip. What can I do in the bedroom?  Use night lights.  Make sure that you have a light by your bed that is easy to reach.  Do not use any sheets or blankets that are too big for your bed. They should not hang down onto the floor.  Have a firm chair that has side arms. You can use this for support while you get dressed.  Do not have throw rugs and other things on the floor that can make you trip. What can I do in the kitchen?  Clean up any spills right away.  Avoid walking on wet floors.  Keep items that you use a lot in easy-to-reach places.  If you need to reach something above you, use a strong step stool that has a grab bar.  Keep electrical cords out of the way.  Do not use floor polish or wax that makes floors slippery. If you must use wax, use non-skid floor wax.  Do  not have throw rugs and other things on the floor that can make you trip. What can I do with my stairs?  Do not leave any items on the stairs.  Make sure that there are handrails on both sides of the stairs and use them. Fix handrails that are broken or loose. Make sure that handrails are as long as the stairways.  Check any carpeting to make sure that it is firmly attached to the stairs. Fix any carpet that is loose or worn.  Avoid having throw rugs at the top or bottom of the stairs. If you do have throw rugs, attach them to the floor with carpet tape.  Make sure that you have a light switch at the top of the stairs and the bottom of the stairs. If you do not have them, ask someone to add them for you. What else can I do to help prevent falls?  Wear shoes that:  Do not have high heels.  Have rubber bottoms.  Are comfortable and fit you well.  Are closed at the toe. Do not wear sandals.  If you  use a stepladder:  Make sure that it is fully opened. Do not climb a closed stepladder.  Make sure that both sides of the stepladder are locked into place.  Ask someone to hold it for you, if possible.  Clearly mark and make sure that you can see:  Any grab bars or handrails.  First and last steps.  Where the edge of each step is.  Use tools that help you move around (mobility aids) if they are needed. These include:  Canes.  Walkers.  Scooters.  Crutches.  Turn on the lights when you go into a dark area. Replace any light bulbs as soon as they burn out.  Set up your furniture so you have a clear path. Avoid moving your furniture around.  If any of your floors are uneven, fix them.  If there are any pets around you, be aware of where they are.  Review your medicines with your doctor. Some medicines can make you feel dizzy. This can increase your chance of falling. Ask your doctor what other things that you can do to help prevent falls. This information is not intended to replace advice given to you by your health care provider. Make sure you discuss any questions you have with your health care provider. Document Released: 05/28/2009 Document Revised: 01/07/2016 Document Reviewed: 09/05/2014 Elsevier Interactive Patient Education  2017 [employer].

## 2020-11-18 NOTE — Progress Notes (Addendum)
Virtual Visit via Telephone Note  I connected with  Joe Davis on 11/18/20 at  1:45 PM EDT by telephone and verified that I am speaking with the correct person using two identifiers.  Medicare Annual Wellness visit completed telephonically due to Covid-19 pandemic.   Persons participating in this call: This Health Coach and this patient.   Location: Patient: Home Provider: Office   I discussed the limitations, risks, security and privacy concerns of performing an evaluation and management service by telephone and the availability of in person appointments. The patient expressed understanding and agreed to proceed.  Unable to perform video visit due to video visit attempted and failed and/or patient does not have video capability.   Some vital signs may be absent or patient reported.   Willette Brace, LPN    Subjective:   Joe Davis is a 75 y.o. male who presents for Medicare Annual/Subsequent preventive examination.  Review of Systems     Cardiac Risk Factors include: advanced age (>33men, >59 women);male gender     Objective:    There were no vitals filed for this visit. There is no height or weight on file to calculate BMI.  Advanced Directives 11/18/2020 10/06/2020 09/18/2019 11/23/2016 08/10/2015 08/10/2015 10/10/2014  Does Patient Have a Medical Advance Directive? Yes Yes Yes Yes No No Yes  Type of Paramedic of [place];Living will Joe Davis;Living will [place];Living will - - -  Does patient want to make changes to medical advance directive? - - No - Patient declined No - Patient declined - - No - Patient declined  Copy of [place] in Chart? No - copy requested No - copy requested No - copy requested, Physician notified No - copy requested - - No - copy requested  Would patient like information on creating a medical advance directive? - - - - No -  patient declined information No - patient declined information -    Current Medications (verified) Outpatient Encounter Medications as of 11/18/2020  Medication Sig  . rivaroxaban (XARELTO) 20 MG TABS tablet Take 1 tablet (20 mg total) by mouth daily with supper.  . tadalafil (CIALIS) 5 MG tablet 5 mg daily as needed for erectile dysfunction.  Marland Kitchen acetaminophen (TYLENOL) 500 MG tablet Take 1,000 mg by mouth 2 (two) times daily.  . traMADol (ULTRAM) 50 MG tablet Take 50 mg by mouth every 4 (four) hours as needed for pain.  . [DISCONTINUED] lidocaine (LIDODERM) 5 % Place 1 patch onto the skin daily as needed (joint pain). Remove & Discard patch within 12 hours or as directed by MD (Patient not taking: Reported on 11/18/2020)  . [DISCONTINUED] XARELTO 10 MG TABS tablet Take 10 mg by mouth daily.   No facility-administered encounter medications on file as of 11/18/2020.    Allergies (verified) Patient has no known allergies.   History: Past Medical History:  Diagnosis Date  . Arthritis    "knees, hips" (10/10/2014)  . Basal cell carcinoma   . BPH (benign prostatic hyperplasia)   . Bradycardia vasovagal response 10 yrs ago   asymptomatic  . Diastolic dysfunction    Per echo in 2008  . DVT (deep venous thrombosis) ([place]) 1979   RLE  . Erectile dysfunction   . History of gout   . History of nuclear stress test    ETT-Myoview 3/17: Hypertensive blood pressure response to exercise, normal perfusion, EF 60%, low risk study  .  Hypercholesterolemia    ? statin intolerant. May tolerate Lipitor  . Normal nuclear stress test May 2011  . Obesity   . Pulmonary embolism ([place]) 1979  . Syncope and collapse 8-10 yrs ago   Past Surgical History:  Procedure Laterality Date  . CARDIOVASCULAR STRESS TEST  May 2011   EF 73%; No ischemia  . COLONOSCOPY  2013  . IVC FILTER INSERTION N/A 10/06/2020   Procedure: IVC FILTER INSERTION;  Surgeon: Serafina Mitchell, MD;  Location: [facility];  Service:  Cardiovascular;  Laterality: N/A;  w/ IVC Venogram  . JOINT REPLACEMENT    . KNEE ARTHROSCOPY Right 10/03/2012   Procedure: RIGHT KNEE ARTHROSCOPY WITH DEBRIDEMENT;  Surgeon: [name], MD;  Location: [facility];  Service: Orthopedics;  Laterality: Right;  WITH DEBRIDEMENT  . MOHS SURGERY  6 yrs    nose  . TOTAL HIP ARTHROPLASTY Right 10/06/2014   Family History  Adopted: Yes  Family history unknown: Yes   Social History   Socioeconomic History  . Marital status: Divorced    Spouse name: Not on file  . Number of children: Not on file  . Years of education: Not on file  . Highest education level: Not on file  Occupational History  . Not on file  Tobacco Use  . Smoking status: Former Smoker    Packs/day: 1.00    Years: 10.00    Pack years: 10.00    Types: Cigarettes    Quit date: 08/15/1977    Years since quitting: 43.2  . Smokeless tobacco: Never Used  Vaping Use  . Vaping Use: Never used  Substance and Sexual Activity  . Alcohol use: Yes    Alcohol/week: 7.0 standard drinks    Types: 7 Shots of liquor per week  . Drug use: No  . Sexual activity: Yes  Other Topics Concern  . Not on file  Social History Narrative  . Not on file   Social Determinants of Health   Financial Resource Strain: Low Risk   . Difficulty of Paying Living Expenses: Not hard at all  Food Insecurity: No Food Insecurity  . Worried About Charity fundraiser in the Last Year: Never true  . Ran Out of Food in the Last Year: Never true  Transportation Needs: No Transportation Needs  . Lack of Transportation (Medical): No  . Lack of Transportation (Non-Medical): No  Physical Activity: Unknown  . Days of Exercise per Week: 5 days  . Minutes of Exercise per Session: Not on file  Stress: No Stress Concern Present  . Feeling of Stress : Not at all  Social Connections: Moderately Isolated  . Frequency of Communication with Friends and Family: More than three times a week  . Frequency of Social  Gatherings with Friends and Family: More than three times a week  . Attends Religious Services: Never  . Active Member of Clubs or Organizations: No  . Attends Archivist Meetings: Never  . Marital Status: Married    Tobacco Counseling Counseling given: Not Answered   Clinical Intake:  Pre-visit preparation completed: Yes  Pain : No/denies pain     Nutritional Risks: None Diabetes: No  How often do you need to have someone help you when you read instructions, pamphlets, or other written materials from your doctor or pharmacy?: 1 - Never  Diabetic?No  Interpreter Needed?: No  Information entered by :: Charlott Rakes, LPN   Activities of Daily Living In your present state of  health, do you have any difficulty performing the following activities: 11/18/2020  Hearing? N  Vision? N  Difficulty concentrating or making decisions? N  Walking or climbing stairs? N  Dressing or bathing? N  Doing errands, shopping? N  Preparing Food and eating ? N  Using the Toilet? N  In the past six months, have you accidently leaked urine? N  Do you have problems with loss of bowel control? N  Managing your Medications? N  Managing your Finances? N  Housekeeping or managing your Housekeeping? N  Some recent data might be hidden    Patient Care Team: [name], MD as PCP - General (Family Medicine) Josue Hector, MD as PCP - Cardiology (Cardiology)  Indicate any recent Medical Services you may have received from other than [facility] providers in the past year (date may be approximate).     Assessment:   This is a routine wellness examination for [place].  Hearing/Vision screen  Hearing Screening   125Hz  250Hz  500Hz  1000Hz  2000Hz  3000Hz  4000Hz  6000Hz  8000Hz   Right ear:           Left ear:           Comments: denies ant hearing issues    Dietary issues and exercise activities discussed: Current Exercise Habits: Home exercise routine, Time (Minutes): 50, Frequency  (Times/Week): 5, Weekly Exercise (Minutes/Week): 250  Goals    . Patient Stated     Lose weight       Depression Screen PHQ 2/9 Scores 11/18/2020 06/29/2016 01/22/2015 01/22/2015 10/03/2013  PHQ - 2 Score 0 0 0 0 0    Fall Risk Fall Risk  11/18/2020 03/14/2019 06/29/2016 01/22/2015 01/22/2015  Falls in the past year? 0 (No Data) No Yes Yes  Comment - Emmi Telephone Survey: data to providers prior to load - - -  Number falls in past yr: 0 (No Data) - 1 1  Comment - Emmi Telephone Survey Actual Response =  - - -  Injury with Fall? 0 - - No Yes  Risk for fall due to : Impaired vision - - - -  Follow up Falls prevention discussed - - - -    FALL RISK PREVENTION PERTAINING TO THE HOME:  Any stairs in or around the home? Yes  If so, are there any without handrails? No  Home free of loose throw rugs in walkways, pet beds, electrical cords, etc? Yes  Adequate lighting in your home to reduce risk of falls? Yes   ASSISTIVE DEVICES UTILIZED TO PREVENT FALLS:  Life alert? No  Use of a cane, walker or w/c? Yes  Grab bars in the bathroom? Yes  Shower chair or bench in shower? No  Elevated toilet seat or a handicapped toilet? No   TIMED UP AND GO:  Was the test performed? No .      Cognitive Function:     6CIT Screen 11/18/2020  What Year? 0 points  What month? 0 points  Count back from 20 0 points  Months in reverse 0 points  Repeat phrase 0 points    Immunizations Immunization History  Administered Date(s) Administered  . Fluad Quad(high Dose 65+) 06/04/2019, 07/17/2020  . Influenza, High Dose Seasonal PF 07/28/2015, 07/12/2017, 06/21/2018  . Influenza-Unspecified 10/07/2014, 06/26/2018  . PFIZER(Purple Top)SARS-COV-2 Vaccination 08/28/2019, 09/17/2019, 05/11/2020  . Pneumococcal Conjugate-13 01/22/2015  . Pneumococcal Polysaccharide-23 06/04/2019  . Td 01/22/2015    TDAP status: Up to date  Flu Vaccine status: Up to date  Pneumococcal vaccine  status: Up to date  Covid-19  vaccine status: Completed vaccines  Qualifies for Shingles Vaccine? Yes   Zostavax completed No   Shingrix Completed?: No.    Education has been provided regarding the importance of this vaccine. Patient has been advised to call insurance company to determine out of pocket expense if they have not yet received this vaccine. Advised may also receive vaccine at local pharmacy or Health Dept. Verbalized acceptance and understanding.  Screening Tests Health Maintenance  Topic Date Due  . Hepatitis C Screening  Never done  . INFLUENZA VACCINE  03/15/2021  . TETANUS/TDAP  01/21/2025  . COVID-19 Vaccine  Completed  . PNA vac Low Risk Adult  Completed  . HPV VACCINES  Aged Out    Health Maintenance  Health Maintenance Due  Topic Date Due  . Hepatitis C Screening  Never done    Colorectal cancer screening: No longer required.    Additional Screening:  Hepatitis C Screening: does qualify;   Vision Screening: Recommended annual ophthalmology exams for early detection of glaucoma and other disorders of the eye. Is the patient up to date with their annual eye exam?  Yes  Who is the provider or what is the name of the office in which the patient attends annual eye exams? Dr Burt Ek  If pt is not established with a provider, would they like to be referred to a provider to establish care? No .   Dental Screening: Recommended annual dental exams for proper oral hygiene  Community Resource Referral / Chronic Care Management: CRR required this visit?  No   CCM required this visit?  No      Plan:     I have personally reviewed and noted the following in the patient's chart:   . Medical and social history . Use of alcohol, tobacco or illicit drugs  . Current medications and supplements . Functional ability and status . Nutritional status . Physical activity . Advanced directives . List of other physicians . Hospitalizations, surgeries, and ER visits in previous 12  months . Vitals . Screenings to include cognitive, depression, and falls . Referrals and appointments  In addition, I have reviewed and discussed with patient certain preventive protocols, quality metrics, and best practice recommendations. A written personalized care plan for preventive services as well as general preventive health recommendations were provided to patient.     Willette Brace, LPN   11/16/3152   Nurse Notes: None

## 2020-12-01 DIAGNOSIS — H401332 Pigmentary glaucoma, bilateral, moderate stage: Secondary | ICD-10-CM | POA: Diagnosis not present

## 2020-12-02 ENCOUNTER — Telehealth: Payer: Self-pay | Admitting: Cardiovascular Disease

## 2020-12-02 DIAGNOSIS — R55 Syncope and collapse: Secondary | ICD-10-CM

## 2020-12-02 MED ORDER — METOPROLOL SUCCINATE ER 25 MG PO TB24: 25.0000 mg | ORAL_TABLET | Freq: Every day | ORAL | 1 refills | Status: DC

## 2020-12-02 NOTE — Telephone Encounter (Signed)
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 Should not have caused syncope start Toprol 25 mg daily and f/u with EP

## 2020-12-02 NOTE — Telephone Encounter (Signed)
Pt's wife returned call from earlier today, pt's wife Arville Go states that Pt does not have Voicemail on his phone and to please call her cell phone listed in the chart because she has voicemail.. Pt's wife states she's not sure why he received a phone call from the office but It may be to speak with the pt about his 2 wk old monitor results

## 2020-12-02 NOTE — Telephone Encounter (Signed)
Spoke to wife (DPR) about cardiac monitoring and the physician ordering toprol 25mg  daily.  Follow-up visit with EP.  Referral sent to [facility] to schedule visit for SVT/atrial tachycardia.

## 2020-12-07 ENCOUNTER — Other Ambulatory Visit: Payer: Self-pay

## 2020-12-07 ENCOUNTER — Ambulatory Visit (INDEPENDENT_AMBULATORY_CARE_PROVIDER_SITE_OTHER): Payer: Medicare Other | Admitting: Surgery

## 2020-12-07 ENCOUNTER — Ambulatory Visit (HOSPITAL_COMMUNITY)
Admission: RE | Admit: 2020-12-07 | Discharge: 2020-12-07 | Disposition: A | Discharge status: Home / Self Care | Payer: Medicare Other | Source: Ambulatory Visit | Attending: Surgery | Admitting: Surgery

## 2020-12-07 ENCOUNTER — Encounter: Payer: Self-pay | Admitting: Surgery

## 2020-12-07 VITALS — BP 151/84 | HR 55 | Temp 98.2°F | Resp 20 | Ht 70.5 in | Wt 217.0 lb

## 2020-12-07 DIAGNOSIS — I824Z2 Acute embolism and thrombosis of unspecified deep veins of left distal lower extremity: Secondary | ICD-10-CM | POA: Diagnosis not present

## 2020-12-07 NOTE — Progress Notes (Signed)
[facility]  Patient name: Joe Davis MRN: [id number] DOB: May 26, 1946 Sex: male   REASON FOR VISIT:    Follow up  HISOTRY OF PRESENT ILLNESS:   Joe Davis a 75 y.o.(10-24-1945)malewho was referred secondary to a history ofleftlower extremity DVT. He was initially treated with Eliquis and this was changed to Xarelto due to affordability. He underwent repeat duplex venous ultrasound on June 22, 2020 with findings of partially recannulized femoral and popliteal veins on the left. He states his swelling and pain from the DVT have resolved, however he is in need of left hip replacement and has left hip painand referred left knee pain. He is having difficulty ambulating due to joint disease and has left hip replacement scheduled for October 10, 2020 by Dr. Nilsa Nutting at [facility]. Because of his DVT history, we elected to place a temporary IVC filter for PE prevention for his surgery.  This was performed on 10/06/2020.  Following his hip surgery, he did have several syncopal episodes.  He is scheduled to see Dr. Lovena Le with EP next week.  He is back on his anticoagulation.  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 This was treated with Coumadin which he took for an extended period of time and then discontinued. He did not undergo hematology work-up for underlying coagulopathy. The patient and his wife are unaware of a provoking factor. He has no prior history of malignancy or hospitalization prior to DVT diagnosis.  The patient has no significant coronary history, no history of coronary intervention or valve replacement. No history of dysrhythmia or pacemaker insertion.    PAST MEDICAL HISTORY:   Past Medical History:  Diagnosis Date  . Arthritis    "knees, hips" (10/10/2014)  . Basal cell carcinoma   . BPH (benign prostatic  hyperplasia)   . Bradycardia vasovagal response 10 yrs ago   asymptomatic  . Diastolic dysfunction    Per echo in 2008  . DVT (deep venous thrombosis) ([place]) 1979   RLE  . Erectile dysfunction   . History of gout   . History of nuclear stress test    ETT-Myoview 3/17: Hypertensive blood pressure response to exercise, normal perfusion, EF 60%, low risk study  . Hypercholesterolemia    ? statin intolerant. May tolerate Lipitor  . Normal nuclear stress test May 2011  . Obesity   . Pulmonary embolism ([place]) 1979  . Syncope and collapse 8-10 yrs ago     FAMILY HISTORY:   Family History  Adopted: Yes  Family history unknown: Yes    SOCIAL HISTORY:   Social History   Tobacco Use  . Smoking status: Former Smoker    Packs/day: 1.00    Years: 10.00    Pack years: 10.00    Types: Cigarettes    Quit date: 08/15/1977    Years since quitting: 43.3  . Smokeless tobacco: Never Used  Substance Use Topics  . Alcohol use: Yes    Alcohol/week: 7.0 standard drinks    Types: 7 Shots of liquor per week     ALLERGIES:   No Known Allergies   CURRENT MEDICATIONS:   Current Outpatient Medications  Medication Sig Dispense Refill  . acetaminophen (TYLENOL) 500 MG tablet Take 1,000 mg by mouth 2 (two) times daily.    . metoprolol succinate (TOPROL XL) 25 MG 24 hr tablet Take 1 tablet (25 mg total) by mouth daily. 90 tablet  1  . rivaroxaban (XARELTO) 20 MG TABS tablet Take 1 tablet (20 mg total) by mouth daily with supper. 5 tablet 0  . tadalafil (CIALIS) 5 MG tablet 5 mg daily as needed for erectile dysfunction.  10  . traMADol (ULTRAM) 50 MG tablet Take 50 mg by mouth every 4 (four) hours as needed for pain.     No current facility-administered medications for this visit.    REVIEW OF SYSTEMS:   [X]  denotes positive finding, [ ]  denotes negative finding Cardiac  Comments:  Chest pain or chest pressure:    Shortness of breath upon exertion: x   Short of breath when lying flat:     Irregular heart rhythm: x       Vascular    Pain in calf, thigh, or hip brought on by ambulation:    Pain in feet at night that wakes you up from your sleep:     Blood clot in your veins:    Leg swelling:  x       Pulmonary    Oxygen at home:    Productive cough:     Wheezing:         Neurologic    Sudden weakness in arms or legs:     Sudden numbness in arms or legs:     Sudden onset of difficulty speaking or slurred speech:    Temporary loss of vision in one eye:     Problems with dizziness:         Gastrointestinal    Blood in stool:     Vomited blood:         Genitourinary    Burning when urinating:     Blood in urine:        Psychiatric    Major depression:         Hematologic    Bleeding problems:    Problems with blood clotting too easily:        Skin    Rashes or ulcers:        Constitutional    Fever or chills:      PHYSICAL EXAM:   Vitals:   12/07/20 0920  BP: (!) 151/84  Pulse: (!) 55  Resp: 20  Temp: 98.2 F (36.8 C)  SpO2: 97%  Weight: 217 lb (98.4 kg)  Height: 5' 10.5" (1.791 m)    GENERAL: The patient is a well-nourished male, in no acute distress. The vital signs are documented above. CARDIAC: There is a regular rate and rhythm.  PULMONARY: Non-labored respirations MUSCULOSKELETAL: There are no major deformities or cyanosis. NEUROLOGIC: No focal weakness or paresthesias are detected. SKIN: There are no ulcers or rashes noted. PSYCHIATRIC: The patient has a normal affect.  STUDIES:   I have reviewed the following : IVC/Iliac: No evidence of thrombus in IVC and Iliac veins.     MEDICAL ISSUES:   IVC filter: We discussed removing his filter in the near future.  He is scheduled to see Dr. Lovena Le next week.  I will hold off on removing his filter until we know whether or not he is going to need a defibrillator.  He has tentatively been placed on the schedule on May 24.    Leia Alf, MD, FACS [facility] and Vein  [facility] [phone] Pager [phone]

## 2020-12-07 NOTE — H&P (View-Only) (Signed)
[facility]  Patient name: Joe Davis MRN: [id number] DOB: May 26, 1946 Sex: male   REASON FOR VISIT:    Follow up  HISOTRY OF PRESENT ILLNESS:   Joe Davis Joe Davis a 75 y.o.(10-24-1945)malewho was referred secondary to a history ofleftlower extremity DVT. He was initially treated with Eliquis and this was changed to Xarelto due to affordability. He underwent repeat duplex venous ultrasound on June 22, 2020 with findings of partially recannulized femoral and popliteal veins on the left. He states his swelling and pain from the DVT have resolved, however he is in need of left hip replacement and has left hip painand referred left knee pain. He is having difficulty ambulating due to joint disease and has left hip replacement scheduled for October 10, 2020 by Dr. Nilsa Nutting at [facility]. Because of his DVT history, we elected to place a temporary IVC filter for PE prevention for his surgery.  This was performed on 10/06/2020.  Following his hip surgery, he did have several syncopal episodes.  He is scheduled to see Dr. Lovena Le with EP next week.  He is back on his anticoagulation.  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 This was treated with Coumadin which he took for an extended period of time and then discontinued. He did not undergo hematology work-up for underlying coagulopathy. The patient and his wife are unaware of a provoking factor. He has no prior history of malignancy or hospitalization prior to DVT diagnosis.  The patient has no significant coronary history, no history of coronary intervention or valve replacement. No history of dysrhythmia or pacemaker insertion.    PAST MEDICAL HISTORY:   Past Medical History:  Diagnosis Date  . Arthritis    "knees, hips" (10/10/2014)  . Basal cell carcinoma   . BPH (benign prostatic  hyperplasia)   . Bradycardia vasovagal response 10 yrs ago   asymptomatic  . Diastolic dysfunction    Per echo in 2008  . DVT (deep venous thrombosis) ([place]) 1979   RLE  . Erectile dysfunction   . History of gout   . History of nuclear stress test    ETT-Myoview 3/17: Hypertensive blood pressure response to exercise, normal perfusion, EF 60%, low risk study  . Hypercholesterolemia    ? statin intolerant. May tolerate Lipitor  . Normal nuclear stress test May 2011  . Obesity   . Pulmonary embolism ([place]) 1979  . Syncope and collapse 8-10 yrs ago     FAMILY HISTORY:   Family History  Adopted: Yes  Family history unknown: Yes    SOCIAL HISTORY:   Social History   Tobacco Use  . Smoking status: Former Smoker    Packs/day: 1.00    Years: 10.00    Pack years: 10.00    Types: Cigarettes    Quit date: 08/15/1977    Years since quitting: 43.3  . Smokeless tobacco: Never Used  Substance Use Topics  . Alcohol use: Yes    Alcohol/week: 7.0 standard drinks    Types: 7 Shots of liquor per week     ALLERGIES:   No Known Allergies   CURRENT MEDICATIONS:   Current Outpatient Medications  Medication Sig Dispense Refill  . acetaminophen (TYLENOL) 500 MG tablet Take 1,000 mg by mouth 2 (two) times daily.    . metoprolol succinate (TOPROL XL) 25 MG 24 hr tablet Take 1 tablet (25 mg total) by mouth daily. 90 tablet  1  . rivaroxaban (XARELTO) 20 MG TABS tablet Take 1 tablet (20 mg total) by mouth daily with supper. 5 tablet 0  . tadalafil (CIALIS) 5 MG tablet 5 mg daily as needed for erectile dysfunction.  10  . traMADol (ULTRAM) 50 MG tablet Take 50 mg by mouth every 4 (four) hours as needed for pain.     No current facility-administered medications for this visit.    REVIEW OF SYSTEMS:   [X]  denotes positive finding, [ ]  denotes negative finding Cardiac  Comments:  Chest pain or chest pressure:    Shortness of breath upon exertion: x   Short of breath when lying flat:     Irregular heart rhythm: x       Vascular    Pain in calf, thigh, or hip brought on by ambulation:    Pain in feet at night that wakes you up from your sleep:     Blood clot in your veins:    Leg swelling:  x       Pulmonary    Oxygen at home:    Productive cough:     Wheezing:         Neurologic    Sudden weakness in arms or legs:     Sudden numbness in arms or legs:     Sudden onset of difficulty speaking or slurred speech:    Temporary loss of vision in one eye:     Problems with dizziness:         Gastrointestinal    Blood in stool:     Vomited blood:         Genitourinary    Burning when urinating:     Blood in urine:        Psychiatric    Major depression:         Hematologic    Bleeding problems:    Problems with blood clotting too easily:        Skin    Rashes or ulcers:        Constitutional    Fever or chills:      PHYSICAL EXAM:   Vitals:   12/07/20 0920  BP: (!) 151/84  Pulse: (!) 55  Resp: 20  Temp: 98.2 F (36.8 C)  SpO2: 97%  Weight: 217 lb (98.4 kg)  Height: 5' 10.5" (1.791 m)    GENERAL: The patient is a well-nourished male, in no acute distress. The vital signs are documented above. CARDIAC: There is a regular rate and rhythm.  PULMONARY: Non-labored respirations MUSCULOSKELETAL: There are no major deformities or cyanosis. NEUROLOGIC: No focal weakness or paresthesias are detected. SKIN: There are no ulcers or rashes noted. PSYCHIATRIC: The patient has a normal affect.  STUDIES:   I have reviewed the following : IVC/Iliac: No evidence of thrombus in IVC and Iliac veins.     MEDICAL ISSUES:   IVC filter: We discussed removing his filter in the near future.  He is scheduled to see Dr. Lovena Le next week.  I will hold off on removing his filter until we know whether or not he is going to need a defibrillator.  He has tentatively been placed on the schedule on May 24.    Leia Alf, MD, FACS Vascular and Vein  Special[facility] [phone] Pager [phone]

## 2020-12-08 ENCOUNTER — Other Ambulatory Visit: Payer: Self-pay

## 2020-12-22 ENCOUNTER — Ambulatory Visit (INDEPENDENT_AMBULATORY_CARE_PROVIDER_SITE_OTHER): Payer: Medicare Other | Admitting: Internal Medicine

## 2020-12-22 ENCOUNTER — Encounter: Payer: Self-pay | Admitting: Internal Medicine

## 2020-12-22 ENCOUNTER — Other Ambulatory Visit: Payer: Self-pay

## 2020-12-22 VITALS — BP 132/70 | HR 53 | Ht 70.5 in | Wt 222.0 lb

## 2020-12-22 DIAGNOSIS — R55 Syncope and collapse: Secondary | ICD-10-CM

## 2020-12-22 NOTE — Progress Notes (Signed)
HPI [name] is referred by [name] for evaluation of syncope. 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 He has a remote h/o a vagal attack as a young man. He has done well until 2017 when he passed out after getting out of the shower. He had previously undergone hip replacement.  He was noted to have some incontinence. He had another syncopal episode spreading lime over a year ago. He passed again several weeks ago. Not much warning. He will feel bad for a few seconds. His wife is a Music therapist and she has been with him for 2 of the episodes and notes he looks ashen and gray and is pulseless and typically out for nearly a minute. He becomes diaphoretic. He wore a cardiac monitor in March demonstrating some SVT and brief NSVT and no pauses. An ECG when the paramedics arrived demonstrated PAF and NSR.  No Known Allergies   Current Outpatient Medications  Medication Sig Dispense Refill  . acetaminophen (TYLENOL) 500 MG tablet Take 1,000 mg by mouth 2 (two) times daily.    . metoprolol succinate (TOPROL XL) 25 MG 24 hr tablet Take 1 tablet (25 mg total) by mouth daily. 90 tablet 1  . rivaroxaban (XARELTO) 20 MG TABS tablet Take 1 tablet (20 mg total) by mouth daily with supper. 5 tablet 0  . tadalafil (CIALIS) 5 MG tablet 5 mg daily as needed for erectile dysfunction.  10   No current facility-administered medications for this visit.     Past Medical History:  Diagnosis Date  . Arthritis    "knees, hips" (10/10/2014)  . Basal cell carcinoma   . BPH (benign prostatic hyperplasia)   . Bradycardia vasovagal response 10 yrs ago   asymptomatic  . Diastolic dysfunction    Per echo in 2008  . DVT (deep venous thrombosis) ([place]) 1979   RLE  . Erectile dysfunction   . History of gout   . History of nuclear stress test    ETT-Myoview 3/17: Hypertensive blood pressure response to exercise, normal perfusion, EF 60%, low risk study  .  Hypercholesterolemia    ? statin intolerant. May tolerate Lipitor  . Normal nuclear stress test May 2011  . Obesity   . Pulmonary embolism ([place]) 1979  . Syncope and collapse 8-10 yrs ago    ROS:   All systems reviewed and negative except as noted in the HPI.   Past Surgical History:  Procedure Laterality Date  . CARDIOVASCULAR STRESS TEST  May 2011   EF 73%; No ischemia  . COLONOSCOPY  2013  . IVC FILTER INSERTION N/A 10/06/2020   Procedure: IVC FILTER INSERTION;  Surgeon: Serafina Mitchell, MD;  Location: [facility];  Service: Cardiovascular;  Laterality: N/A;  w/ IVC Venogram  . JOINT REPLACEMENT    . KNEE ARTHROSCOPY Right 10/03/2012   Procedure: RIGHT KNEE ARTHROSCOPY WITH DEBRIDEMENT;  Surgeon: [name], MD;  Location: [facility];  Service: Orthopedics;  Laterality: Right;  WITH DEBRIDEMENT  . MOHS SURGERY  6 yrs    nose  . TOTAL HIP ARTHROPLASTY Right 10/06/2014     Family History  Adopted: Yes  Family history unknown: Yes     Social History   Socioeconomic History  . Marital status: Divorced    Spouse name: Not on file  . Number of children: Not on file  . Years of education: Not on file  .  Highest education level: Not on file  Occupational History  . Not on file  Tobacco Use  . Smoking status: Former Smoker    Packs/day: 1.00    Years: 10.00    Pack years: 10.00    Types: Cigarettes    Quit date: 08/15/1977    Years since quitting: 43.3  . Smokeless tobacco: Never Used  Vaping Use  . Vaping Use: Never used  Substance and Sexual Activity  . Alcohol use: Yes    Alcohol/week: 7.0 standard drinks    Types: 7 Shots of liquor per week  . Drug use: No  . Sexual activity: Yes  Other Topics Concern  . Not on file  Social History Narrative  . Not on file   Social Determinants of Health   Financial Resource Strain: Low Risk   . Difficulty of Paying Living Expenses: Not hard at all  Food Insecurity: No Food Insecurity  . Worried About Paediatric nurse in the Last Year: Never true  . Ran Out of Food in the Last Year: Never true  Transportation Needs: No Transportation Needs  . Lack of Transportation (Medical): No  . Lack of Transportation (Non-Medical): No  Physical Activity: Unknown  . Days of Exercise per Week: 5 days  . Minutes of Exercise per Session: Not on file  Stress: No Stress Concern Present  . Feeling of Stress : Not at all  Social Connections: Moderately Isolated  . Frequency of Communication with Friends and Family: More than three times a week  . Frequency of Social Gatherings with Friends and Family: More than three times a week  . Attends Religious Services: Never  . Active Member of Clubs or Organizations: No  . Attends Archivist Meetings: Never  . Marital Status: Married  Human resources officer Violence: Not At Risk  . Fear of Current or Ex-Partner: No  . Emotionally Abused: No  . Physically Abused: No  . Sexually Abused: No     BP 132/70   Pulse (!) 53   Ht 5' 10.5" (1.791 m)   Wt 222 lb (100.7 kg)   SpO2 96%   BMI 31.40 kg/m  No orthostasis Physical Exam:  Well appearing NAD HEENT: Unremarkable Neck:  No JVD, no thyromegally Lymphatics:  No adenopathy Back:  No CVA tenderness Lungs:  Clear with no wheezes HEART:  Regular rate rhythm, no murmurs, no rubs, no clicks Abd:  soft, positive bowel sounds, no organomegally, no rebound, no guarding Ext:  2 plus pulses, no edema, no cyanosis, no clubbing Skin:  No rashes no nodules Neuro:  CN II through XII intact, motor grossly intact  DEVICE  Normal device function.  See PaceArt for details.   Assess/Plan: 1. 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 However the are features like no warning which might suggest a different mechanism. He does not typically have any prodrome. He does not feel poorly until a few seconds before and there is no obvious primary trigger. I have recommended he undergo  ILR insertion. 2. DVT - he will likely need systemic anti-coagulation indefinitely.  Carleene Overlie Anastaisa Wooding,MD

## 2020-12-22 NOTE — Patient Instructions (Addendum)
Medication Instructions:  Your physician recommends that you continue on your current medications as directed. Please refer to the Current Medication list given to you today.  Labwork: None ordered.  Testing/Procedures: None ordered.  Follow-Up: Your physician wants you to follow-up in:   January 19, 2021 AT 10:00 AM at the [facility] office with Dr. Lovena Le  NO SPECIAL INSTRUCTIONS  Any Other Special Instructions Will Be Listed Below (If Applicable).  If you need a refill on your cardiac medications before your next appointment, please call your pharmacy.    Implantable Loop Recorder Placement  An implantable loop recorder is a small electronic device that is placed under the skin of your chest. The device records the electrical activity of your heart over a long period of time. Your health care provider can download these recordings to monitor your heart. You may need an implantable loop recorder if you have periods of abnormal heart activity (arrhythmias) or unexplained fainting (syncope). The recorder can be left in place for 1 year or longer.

## 2021-01-04 ENCOUNTER — Other Ambulatory Visit (HOSPITAL_COMMUNITY): Payer: Medicare Other

## 2021-01-05 ENCOUNTER — Other Ambulatory Visit: Payer: Self-pay

## 2021-01-05 ENCOUNTER — Ambulatory Visit (HOSPITAL_COMMUNITY)
Admission: RE | Admit: 2021-01-05 | Discharge: 2021-01-05 | Disposition: A | Discharge status: Home / Self Care | Payer: Medicare Other | Attending: Surgery | Admitting: Surgery

## 2021-01-05 ENCOUNTER — Ambulatory Visit (HOSPITAL_COMMUNITY)
Admission: RE | Disposition: A | Discharge status: Home / Self Care | Payer: Self-pay | Source: Home / Self Care | Attending: Surgery

## 2021-01-05 ENCOUNTER — Encounter (HOSPITAL_COMMUNITY): Payer: Self-pay | Admitting: Surgery

## 2021-01-05 DIAGNOSIS — Z86718 Personal history of other venous thrombosis and embolism: Secondary | ICD-10-CM | POA: Insufficient documentation

## 2021-01-05 DIAGNOSIS — E669 Obesity, unspecified: Secondary | ICD-10-CM | POA: Diagnosis not present

## 2021-01-05 DIAGNOSIS — Z87891 Personal history of nicotine dependence: Secondary | ICD-10-CM | POA: Diagnosis not present

## 2021-01-05 DIAGNOSIS — Z4589 Encounter for adjustment and management of other implanted devices: Secondary | ICD-10-CM | POA: Diagnosis not present

## 2021-01-05 DIAGNOSIS — Z6830 Body mass index (BMI) 30.0-30.9, adult: Secondary | ICD-10-CM | POA: Insufficient documentation

## 2021-01-05 DIAGNOSIS — E7800 Pure hypercholesterolemia, unspecified: Secondary | ICD-10-CM | POA: Insufficient documentation

## 2021-01-05 DIAGNOSIS — Z86711 Personal history of pulmonary embolism: Secondary | ICD-10-CM | POA: Diagnosis not present

## 2021-01-05 DIAGNOSIS — N529 Male erectile dysfunction, unspecified: Secondary | ICD-10-CM | POA: Insufficient documentation

## 2021-01-05 DIAGNOSIS — Z7901 Long term (current) use of anticoagulants: Secondary | ICD-10-CM | POA: Insufficient documentation

## 2021-01-05 DIAGNOSIS — Z79899 Other long term (current) drug therapy: Secondary | ICD-10-CM | POA: Insufficient documentation

## 2021-01-05 HISTORY — PX: IVC FILTER REMOVAL: CATH118246

## 2021-01-05 LAB — POCT I-STAT, CHEM 8
BUN: 17 mg/dL (ref 8–23)
Calcium, Ion: 1.25 mmol/L (ref 1.15–1.40)
Chloride: 109 mmol/L (ref 98–111)
Creatinine, Ser: 0.7 mg/dL (ref 0.61–1.24)
Glucose, Bld: 111 mg/dL — ABNORMAL HIGH (ref 70–99)
HCT: 40 % (ref 39.0–52.0)
Hemoglobin: 13.6 g/dL (ref 13.0–17.0)
Potassium: 4.2 mmol/L (ref 3.5–5.1)
Sodium: 143 mmol/L (ref 135–145)
TCO2: 22 mmol/L (ref 22–32)

## 2021-01-05 SURGERY — IVC FILTER REMOVAL
Anesthesia: LOCAL

## 2021-01-05 MED ORDER — IODIXANOL 320 MG/ML IV SOLN
INTRAVENOUS | Status: DC | PRN
  Administered 2021-01-05: 5 mL via INTRAVENOUS

## 2021-01-05 MED ORDER — HEPARIN (PORCINE) IN NACL 1000-0.9 UT/500ML-% IV SOLN
INTRAVENOUS | Status: DC | PRN
  Administered 2021-01-05: 500 mL

## 2021-01-05 MED ORDER — MIDAZOLAM HCL 2 MG/2ML IJ SOLN
INTRAMUSCULAR | Status: DC | PRN
  Administered 2021-01-05: 1 mg via INTRAVENOUS
  Administered 2021-01-05: 2 mg via INTRAVENOUS

## 2021-01-05 MED ORDER — FENTANYL CITRATE (PF) 100 MCG/2ML IJ SOLN
INTRAMUSCULAR | Status: DC | PRN
  Administered 2021-01-05: 25 ug via INTRAVENOUS
  Administered 2021-01-05: 50 ug via INTRAVENOUS

## 2021-01-05 MED ORDER — MIDAZOLAM HCL 2 MG/2ML IJ SOLN
INTRAMUSCULAR | Status: AC
  Filled 2021-01-05: qty 2

## 2021-01-05 MED ORDER — LIDOCAINE HCL (PF) 1 % IJ SOLN
INTRAMUSCULAR | Status: DC | PRN
  Administered 2021-01-05: 5 mL via INTRADERMAL

## 2021-01-05 MED ORDER — FENTANYL CITRATE (PF) 100 MCG/2ML IJ SOLN
INTRAMUSCULAR | Status: AC
  Filled 2021-01-05: qty 2

## 2021-01-05 MED ORDER — SODIUM CHLORIDE 0.9 % IV SOLN: INTRAVENOUS | Status: DC

## 2021-01-05 MED ORDER — HEPARIN SODIUM (PORCINE) 1000 UNIT/ML IJ SOLN
INTRAMUSCULAR | Status: AC
  Filled 2021-01-05: qty 1

## 2021-01-05 MED ORDER — HEPARIN (PORCINE) IN NACL 2000-0.9 UNIT/L-% IV SOLN
INTRAVENOUS | Status: AC
  Filled 2021-01-05: qty 1000

## 2021-01-05 SURGICAL SUPPLY — 14 items
CATH ANGIO 5F BER2 100CM (CATHETERS) ×1 IMPLANT
CATH OMNI FLUSH 5F 65CM (CATHETERS) ×1 IMPLANT
CATHETER LAUNCHER 6FR LCB (CATHETERS) ×1 IMPLANT
DEVICE ENSNARE  12MMX20MM (VASCULAR PRODUCTS) ×2
DEVICE ENSNARE 12MMX20MM (VASCULAR PRODUCTS) IMPLANT
DEVICE ONE SNARE 10MM (MISCELLANEOUS) ×1 IMPLANT
KIT MICROPUNCTURE NIT STIFF (SHEATH) ×1 IMPLANT
PROTECTION STATION PRESSURIZED (MISCELLANEOUS) ×2
SHEATH PINNACLE ST 7F 45CM (SHEATH) ×2 IMPLANT
SHEATH PROBE COVER 6X72 (BAG) ×1 IMPLANT
SHIELD RADPAD SCOOP 12X17 (MISCELLANEOUS) ×1 IMPLANT
STATION PROTECTION PRESSURIZED (MISCELLANEOUS) IMPLANT
TRAY PV CATH (CUSTOM PROCEDURE TRAY) ×2 IMPLANT
WIRE BENTSON .035X145CM (WIRE) ×1 IMPLANT

## 2021-01-05 NOTE — Discharge Instructions (Signed)
Inferior Vena Cava Filter Removal, Care After This sheet gives you information about how to care for yourself after your procedure. Your health care provider may also give you more specific instructions. If you have problems or questions, contact your health care provider. What can I expect after the procedure? After the procedure, it is common to have:  Mild pain and bruising around the area where the long, thin tube (catheter) was inserted in your neck or groin.  Tiredness (fatigue). Follow these instructions at home: Insertion site care  Follow instructions from your health care provider about how to take care of your catheter insertion site. Make sure you: ? Wash your hands with soap and water for at least 20 seconds before and after you change your bandage (dressing). If soap and water are not available, use hand sanitizer. ? Change your dressing as told by your health care provider.  Check your insertion site every day for signs of infection. Check for: ? Redness, swelling, or more pain. ? Fluid or blood. ? Warmth. ? Pus or a bad smell.   General instructions  Take over-the-counter and prescription medicines only as told by your health care provider.  Do not take baths, swim, or use a hot tub until your health care provider approves. Ask your health care provider if you may take showers.  If you were given a sedative during the procedure, it can affect you for several hours. Do not drive or operate machinery until your health care provider says that it is safe.  Return to your normal activities as told by your health care provider. Ask your health care provider what activities are safe for you.  Keep all follow-up visits as told by your health care provider. This is important. Contact a health care provider if:  You have chills or a fever.  You have redness, swelling, or more pain around your catheter insertion site.  Your insertion site feels warm to the touch.  You have  pus or a bad smell coming from your insertion site. Get help right away if:  You have blood coming from your catheter insertion site (active bleeding). ? If you have bleeding from the insertion site, lie down, apply pressure to the area with a clean cloth or gauze, and get help right away.  You have chest pain.  You have difficulty breathing. Summary  Follow instructions from your health care provider about how to take care of your catheter insertion site.  Return to your normal activities as told by your health care provider.  Check your catheter insertion site every day for signs of infection.  Get help right away if you have active bleeding, chest pain, or trouble breathing. This information is not intended to replace advice given to you by your health care provider. Make sure you discuss any questions you have with your health care provider. Document Revised: 07/31/2019 Document Reviewed: 07/31/2019 Elsevier Patient Education  2021 [employer].

## 2021-01-05 NOTE — Interval H&P Note (Signed)
History and Physical Interval Note:  01/05/2021 7:44 AM  Joe Davis  has presented today for surgery, with the diagnosis of peripheral vascular disease.  The various methods of treatment have been discussed with the patient and family. After consideration of risks, benefits and other options for treatment, the patient has consented to  Procedure(s): IVC FILTER REMOVAL (N/A) as a surgical intervention.  The patient's history has been reviewed, patient examined, no change in status, stable for surgery.  I have reviewed the patient's chart and labs.  Questions were answered to the patient's satisfaction.     Annamarie Major

## 2021-01-05 NOTE — Op Note (Signed)
    Patient name: Joe Davis MRN: [id number] DOB: 31-May-1946 Sex: male  01/05/2021 Pre-operative Diagnosis: History of DVT Post-operative diagnosis:  Same Surgeon:  Annamarie Major Procedure Performed:  1.  Ultrasound-guided access, right internal jugular vein  2.  Inferior venacavogram  3.  Foreign body removal (IVC filter)  4.  Conscious sedation, 77 minutes     Indications: The patient has a history of DVT.  A IVC filter was placed prior to orthopedic surgery.  Recent ultrasound showed no evidence of DVT.  He comes in today for removal.  Procedure:  The patient was identified in the holding area and taken to room 8.  The patient was then placed supine on the table and prepped and draped in the usual sterile fashion.  A time out was called.  Conscious sedation was administered with the use of IV fentanyl and Versed under continuous physician and nurse monitoring.  Heart rate, blood pressure, and oxygen saturation were continuously monitored.  Total sedation time was 77 minutes.  Ultrasound was used to evaluate the right internal jugular vein which is widely patent and easily compressible.  A digital ultrasound image was acquired.  1% lidocaine was used for local anesthesia.  The right internal jugular vein was then cannulated under ultrasound guidance with a micropuncture needle.  A 018 wire was advanced without resistance, placement of micropuncture sheath.  A 035 Bentson wire was then inserted.  A pigtail catheter was then advanced below the filter and inferior venacavogram was performed  Findings:   IVC gram: The filter is in good position without significant tilt.  No thrombus is visualized within the inferior vena cava  Intervention: After the above images were acquired the decision made to proceed with intervention.  A 7 French sheath was then inserted into the inferior vena cava.  I first tried to snare the filter with a 10 mm gooseneck snare.  I was unable to get around the filter.  I  suspected that it was leaning up against the wall.  I then used a Berenstein 2 catheter to try to get around the filter, however this was also unsuccessful.  I then switched out to a ENSNARE, but again this was unsuccessful.  I then used a left coronary bypass catheter and a 10 mm snare and was ultimately able to get around the filter and grabbed the hook.  The filter was then easily removed.  It was inspected on the back table.  There was no thrombus.  I did give the patient 5000 units of heparin during the procedure.  The sheath was withdrawn from the right neck and manual pressure was held for hemostasis.  And IV complications.  Impression:  #1  Successful removal of IVC filter    V. Annamarie Major, M.D., [facility] [place] Office: [phone] Pager:  [phone]

## 2021-01-06 MED FILL — Heparin Sodium (Porcine) Inj 1000 Unit/ML: INTRAMUSCULAR | Qty: 10 | Status: AC

## 2021-01-07 ENCOUNTER — Encounter (HOSPITAL_COMMUNITY): Payer: Self-pay | Admitting: Surgery

## 2021-01-19 ENCOUNTER — Other Ambulatory Visit: Payer: Self-pay

## 2021-01-19 ENCOUNTER — Telehealth: Payer: Self-pay | Admitting: Internal Medicine

## 2021-01-19 ENCOUNTER — Ambulatory Visit (INDEPENDENT_AMBULATORY_CARE_PROVIDER_SITE_OTHER): Payer: Medicare Other | Admitting: Internal Medicine

## 2021-01-19 ENCOUNTER — Encounter: Payer: Self-pay | Admitting: Internal Medicine

## 2021-01-19 VITALS — BP 124/74 | HR 51 | Ht 70.5 in | Wt 217.0 lb

## 2021-01-19 DIAGNOSIS — R55 Syncope and collapse: Secondary | ICD-10-CM

## 2021-01-19 NOTE — Patient Instructions (Addendum)
Medication Instructions:  Your physician recommends that you continue on your current medications as directed. Please refer to the Current Medication list given to you today.  Labwork: None ordered.  Testing/Procedures: None ordered.  Follow-Up:  Your physician wants you to follow-up in: 6 months with Dr. Lovena Le.  You will receive a reminder letter in the mail two months in advance. If you don't receive a letter, please call our office to schedule the follow-up appointment.    Implantable Loop Recorder Placement, Care After This sheet gives you information about how to care for yourself after your procedure. Your health care provider may also give you more specific instructions. If you have problems or questions, contact your health care provider. What can I expect after the procedure? After the procedure, it is common to have:  Soreness or discomfort near the incision.  Some swelling or bruising near the incision.  Follow these instructions at home: Incision care  1.  Leave your outer dressing on for 72 hours.  After 72 hours you can remove your outer dressing and shower. 2. Leave adhesive strips in place. These skin closures may need to stay in place for 1-2 weeks. If adhesive strip edges start to loosen and curl up, you may trim the loose edges.  You may remove the strips if they have not fallen off after 2 weeks. 3. Check your incision area every day for signs of infection. Check for: a. Redness, swelling, or pain. b. Fluid or blood. c. Warmth. d. Pus or a bad smell. 4. Do not take baths, swim, or use a hot tub until your incision is completely healed. 5. If your wound site starts to bleed apply pressure.      If you have any questions/concerns please call the device clinic at [phone].  Activity  Return to your normal activities.  General instructions  Follow instructions from your health care provider about how to manage your implantable loop recorder and  transmit the information. Learn how to activate a recording if this is necessary for your type of device.  You may go through a metal detection gate, and you may let someone hold a metal detector over your chest. Show your ID card.  Do not have an MRI unless you check with your health care provider first.  Take over-the-counter and prescription medicines only as told by your health care provider.  Keep all follow-up visits as told by your health care provider. This is important. Contact a health care provider if:  You have redness, swelling, or pain around your incision.  You have a fever.  You have pain that is not relieved by your pain medicine.  You have triggered your device because of fainting (syncope) or because of a heartbeat that feels like it is racing, slow, fluttering, or skipping (palpitations). Get help right away if you have:  Chest pain.  Difficulty breathing. Summary  After the procedure, it is common to have soreness or discomfort near the incision.  Change your dressing as told by your health care provider.  Follow instructions from your health care provider about how to manage your implantable loop recorder and transmit the information.  Keep all follow-up visits as told by your health care provider. This is important. This information is not intended to replace advice given to you by your health care provider. Make sure you discuss any questions you have with your health care provider. Document Released: 07/13/2015 Document Revised: 09/16/2017 Document Reviewed: 09/16/2017 Elsevier Patient Education  2020  [employer].

## 2021-01-19 NOTE — Telephone Encounter (Signed)
Returned call to Pt's wife.  States they are having issues with home monitoring on phone.  Advised to call device clinic directly tomorrow if still having issues.

## 2021-01-19 NOTE — Telephone Encounter (Signed)
Pt states he just left the office and he forgot to give some information. Please advise

## 2021-01-19 NOTE — Progress Notes (Signed)
HPI Joe Davis returns today for ILR insertion due to unexplained syncope. 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 He also has PAF. He has never had an explanation for his syncope. I have recommended ILR insertion. See my note from several weeks ago for more details. No Known Allergies   Current Outpatient Medications  Medication Sig Dispense Refill  . acetaminophen (TYLENOL) 500 MG tablet Take 1,000 mg by mouth every 8 (eight) hours as needed for mild pain or headache.    . diclofenac Sodium (VOLTAREN) 1 % GEL Apply 2 g topically every 30 (thirty) days.    . metoprolol succinate (TOPROL XL) 25 MG 24 hr tablet Take 1 tablet (25 mg total) by mouth daily. 90 tablet 1  . rivaroxaban (XARELTO) 20 MG TABS tablet Take 1 tablet (20 mg total) by mouth daily with supper. 5 tablet 0  . tadalafil (CIALIS) 5 MG tablet Take 5 mg by mouth daily as needed for erectile dysfunction.  10   No current facility-administered medications for this visit.     Past Medical History:  Diagnosis Date  . Arthritis    "knees, hips" (10/10/2014)  . Basal cell carcinoma   . BPH (benign prostatic hyperplasia)   . Bradycardia vasovagal response 10 yrs ago   asymptomatic  . Diastolic dysfunction    Per echo in 2008  . DVT (deep venous thrombosis) ([place]) 1979   RLE  . Erectile dysfunction   . History of gout   . History of nuclear stress test    ETT-Myoview 3/17: Hypertensive blood pressure response to exercise, normal perfusion, EF 60%, low risk study  . Hypercholesterolemia    ? statin intolerant. May tolerate Lipitor  . Normal nuclear stress test May 2011  . Obesity   . Pulmonary embolism ([place]) 1979  . Syncope and collapse 8-10 yrs ago    ROS:   All systems reviewed and negative except as noted in the HPI.   Past Surgical History:  Procedure Laterality Date  . CARDIOVASCULAR STRESS TEST  May 2011   EF 73%; No ischemia  . COLONOSCOPY  2013  . IVC FILTER  INSERTION N/A 10/06/2020   Procedure: IVC FILTER INSERTION;  Surgeon: Serafina Mitchell, MD;  Location: [facility];  Service: Cardiovascular;  Laterality: N/A;  w/ IVC Venogram  . IVC FILTER REMOVAL N/A 01/05/2021   Procedure: IVC FILTER REMOVAL;  Surgeon: Serafina Mitchell, MD;  Location: [facility];  Service: Cardiovascular;  Laterality: N/A;  . JOINT REPLACEMENT    . KNEE ARTHROSCOPY Right 10/03/2012   Procedure: RIGHT KNEE ARTHROSCOPY WITH DEBRIDEMENT;  Surgeon: [name], MD;  Location: [facility];  Service: Orthopedics;  Laterality: Right;  WITH DEBRIDEMENT  . MOHS SURGERY  6 yrs    nose  . TOTAL HIP ARTHROPLASTY Right 10/06/2014     Family History  Adopted: Yes  Family history unknown: Yes     Social History   Socioeconomic History  . Marital status: Divorced    Spouse name: Not on file  . Number of children: Not on file  . Years of education: Not on file  . Highest education level: Not on file  Occupational History  . Not on file  Tobacco Use  . Smoking status: Former Smoker    Packs/day: 1.00    Years: 10.00    Pack years: 10.00    Types: Cigarettes  Quit date: 08/15/1977    Years since quitting: 43.4  . Smokeless tobacco: Never Used  Vaping Use  . Vaping Use: Never used  Substance and Sexual Activity  . Alcohol use: Yes    Alcohol/week: 7.0 standard drinks    Types: 7 Shots of liquor per week  . Drug use: No  . Sexual activity: Yes  Other Topics Concern  . Not on file  Social History Narrative  . Not on file   Social Determinants of Health   Financial Resource Strain: Low Risk   . Difficulty of Paying Living Expenses: Not hard at all  Food Insecurity: No Food Insecurity  . Worried About Charity fundraiser in the Last Year: Never true  . Ran Out of Food in the Last Year: Never true  Transportation Needs: No Transportation Needs  . Lack of Transportation (Medical): No  . Lack of Transportation (Non-Medical): No  Physical Activity:  Unknown  . Days of Exercise per Week: 5 days  . Minutes of Exercise per Session: Not on file  Stress: No Stress Concern Present  . Feeling of Stress : Not at all  Social Connections: Moderately Isolated  . Frequency of Communication with Friends and Family: More than three times a week  . Frequency of Social Gatherings with Friends and Family: More than three times a week  . Attends Religious Services: Never  . Active Member of Clubs or Organizations: No  . Attends Archivist Meetings: Never  . Marital Status: Married  Human resources officer Violence: Not At Risk  . Fear of Current or Ex-Partner: No  . Emotionally Abused: No  . Physically Abused: No  . Sexually Abused: No     BP 124/74   Pulse (!) 51   Ht 5' 10.5" (1.791 m)   Wt 217 lb (98.4 kg)   SpO2 95%   BMI 30.70 kg/m   Physical Exam:  Well appearing NAD HEENT: Unremarkable Neck:  No JVD, no thyromegally Lymphatics:  No adenopathy Back:  No CVA tenderness Lungs:  Clear HEART:  Regular rate rhythm, no murmurs, no rubs, no clicks Abd:  soft, positive bowel sounds, no organomegally, no rebound, no guarding Ext:  2 plus pulses, no edema, no cyanosis, no clubbing Skin:  No rashes no nodules Neuro:  CN II through XII intact, motor grossly intact  EP Procedure Note Procedure performed: ILR insertion  Preoperative diagnosis: unexplained syncope  Postoperative diagnosis: unexplained syncope  Description of the procedure: after informed consent was obtained the patient was prepped and draped in a sterile fashion. 10 cc of lidocaine was infiltrated into the left pectoral region. A one cm stab incision was carried out. The medtronic ILR, [id number] was inserted. The R waves measured 0.25. Benzoin and steristrips were painted on the skin. A bandage was applied. The patient was recovered in the usual manner.  Assess/Plan: 1. Unexplained syncope - he is s/p ILR. He will undergo watchful waiting. 2. Atrial fib - with  his ILR we will be able to follow his atrial fib burden and see if he has any pauses. 3. HTN - his bp is well controlled. We will follow.  Carleene Overlie Binta Statzer,MD

## 2021-01-22 ENCOUNTER — Telehealth: Payer: Self-pay | Admitting: Internal Medicine

## 2021-01-22 NOTE — Telephone Encounter (Signed)
Pt friend Joe Davis is calling in regards to blood soaked steri strips from a recent Loop recorder  placement on 01/19/21. Pt is not actively bleeding, she only wants to know if she should remove the blood soaked steri strips. Please advise

## 2021-01-22 NOTE — Telephone Encounter (Signed)
Joe Davis is listed on DPR. Informed patient to leave steri-strips in place until they fall off on their own. No active bleeding from wound site or any problems reported.Education done on s/sx of infection and device clinic # provided.

## 2021-01-28 ENCOUNTER — Other Ambulatory Visit: Payer: Self-pay | Admitting: Family Medicine

## 2021-02-22 ENCOUNTER — Ambulatory Visit (INDEPENDENT_AMBULATORY_CARE_PROVIDER_SITE_OTHER): Payer: Medicare Other

## 2021-02-22 DIAGNOSIS — R55 Syncope and collapse: Secondary | ICD-10-CM | POA: Diagnosis not present

## 2021-03-03 ENCOUNTER — Telehealth: Payer: Self-pay | Admitting: Family Medicine

## 2021-03-03 ENCOUNTER — Telehealth: Payer: Self-pay

## 2021-03-03 MED ORDER — MOLNUPIRAVIR EUA 200MG CAPSULE: 4.0000 | ORAL_CAPSULE | Freq: Two times a day (BID) | ORAL | 0 refills | Status: AC

## 2021-03-03 NOTE — Telephone Encounter (Signed)
Patient called after hours with positive COVID test.  Onset about 24 hours ago of fever, fatigue, sore throat, cough.  No dyspnea.  He had temperature 103 earlier today but currently normal.  Has taken some Tylenol.  Has been fully vaccinated.  No nausea, vomiting, or diarrhea.  His medications include low-dose metoprolol and Xarelto.  He has past history of DVT, prediabetes, gout, diastolic dysfunction.  -We discussed antiviral treatment.  There is potential interaction with Paxlovid and Xarelto We elected to start Molnupiravir 4 capsules twice daily for 5 days.  Follow-up immediately for increased shortness of breath or other concerns.  [name] MD [facility]

## 2021-03-03 NOTE — Telephone Encounter (Signed)
Discussed with patient.  We ended up sending in [place]

## 2021-03-03 NOTE — Telephone Encounter (Signed)
Patient's wife called in to let us know that the patient has covid. She would like a medication sent in for him. I informed her that we usually require a virtual visit for this. She stated that [place] is [place] right now and he is not capable of doing a virtual visit on his own. Please advise.  Patient's cell: [phone]

## 2021-03-04 ENCOUNTER — Telehealth: Payer: Medicare Other | Admitting: Family Medicine

## 2021-03-12 ENCOUNTER — Telehealth: Payer: Self-pay | Admitting: Family Medicine

## 2021-03-12 NOTE — Telephone Encounter (Signed)
Pt informed of the message below and verbalized understanding. 

## 2021-03-12 NOTE — Telephone Encounter (Signed)
Pt is calling in stating that he is not feeling any better after taking the medication that he got on 03/05/2021 for COVID and wanted to know if Dr. Elease Hashimoto would want to refill the medication of just for he to ride it out.  Pharm:  [facility] in [place], [place]  Pt would like to have a call back.

## 2021-03-16 DIAGNOSIS — Z20822 Contact with and (suspected) exposure to covid-19: Secondary | ICD-10-CM | POA: Diagnosis not present

## 2021-03-16 DIAGNOSIS — Z711 Person with feared health complaint in whom no diagnosis is made: Secondary | ICD-10-CM | POA: Diagnosis not present

## 2021-03-16 DIAGNOSIS — Z03818 Encounter for observation for suspected exposure to other biological agents ruled out: Secondary | ICD-10-CM | POA: Diagnosis not present

## 2021-03-18 NOTE — Progress Notes (Signed)
Carelink Summary Report / Loop Recorder 

## 2021-03-29 ENCOUNTER — Ambulatory Visit (INDEPENDENT_AMBULATORY_CARE_PROVIDER_SITE_OTHER): Payer: Medicare Other

## 2021-03-29 DIAGNOSIS — R55 Syncope and collapse: Secondary | ICD-10-CM | POA: Diagnosis not present

## 2021-03-30 LAB — CUP PACEART REMOTE DEVICE CHECK
Date Time Interrogation Session: 20220812155742
Implantable Pulse Generator Implant Date: 20220607

## 2021-04-16 NOTE — Progress Notes (Signed)
Carelink Summary Report / Loop Recorder 

## 2021-04-28 LAB — CUP PACEART REMOTE DEVICE CHECK
Date Time Interrogation Session: 20220914155844
Implantable Pulse Generator Implant Date: 20220607

## 2021-05-03 ENCOUNTER — Ambulatory Visit (INDEPENDENT_AMBULATORY_CARE_PROVIDER_SITE_OTHER): Payer: Medicare Other

## 2021-05-03 DIAGNOSIS — R55 Syncope and collapse: Secondary | ICD-10-CM

## 2021-05-10 NOTE — Progress Notes (Signed)
Carelink Summary Report / Loop Recorder 

## 2021-05-16 ENCOUNTER — Other Ambulatory Visit: Payer: Self-pay | Admitting: Cardiovascular Disease

## 2021-05-17 ENCOUNTER — Other Ambulatory Visit: Payer: Self-pay | Admitting: Cardiovascular Disease

## 2021-06-07 ENCOUNTER — Ambulatory Visit (INDEPENDENT_AMBULATORY_CARE_PROVIDER_SITE_OTHER): Payer: Medicare Other

## 2021-06-07 DIAGNOSIS — R55 Syncope and collapse: Secondary | ICD-10-CM

## 2021-06-07 LAB — CUP PACEART REMOTE DEVICE CHECK
Date Time Interrogation Session: 20221017155647
Implantable Pulse Generator Implant Date: 20220607

## 2021-06-15 NOTE — Progress Notes (Signed)
Carelink Summary Report / Loop Recorder 

## 2021-06-17 ENCOUNTER — Other Ambulatory Visit: Payer: Self-pay | Admitting: Cardiovascular Disease

## 2021-06-18 ENCOUNTER — Telehealth: Payer: Self-pay | Admitting: Cardiovascular Disease

## 2021-06-18 MED ORDER — METOPROLOL SUCCINATE ER 25 MG PO TB24: 25.0000 mg | ORAL_TABLET | Freq: Every day | ORAL | 0 refills | Status: DC

## 2021-06-18 NOTE — Telephone Encounter (Signed)
**Note De-Identified Vu Liebman Obfuscation** Metoprolol 25 mg #90 tablets e-scribed to [facility] to fill as requested.

## 2021-06-18 NOTE — Telephone Encounter (Signed)
 *  STAT* If patient is at the pharmacy, call can be transferred to refill team.   1. Which medications need to be refilled? (please list name of each medication and dose if known) metoprolol succinate (TOPROL-XL) 25 MG 24 hr tablet  2. Which pharmacy/location (including street and city if local pharmacy) is medication to be sent to? [id number] - [place],  - [address]  3. Do they need a 30 day or 90 day supply? 90 days  Pt made an appt on 09/07/21. Pt is out of meds

## 2021-06-21 ENCOUNTER — Other Ambulatory Visit: Payer: Self-pay

## 2021-06-21 ENCOUNTER — Ambulatory Visit (INDEPENDENT_AMBULATORY_CARE_PROVIDER_SITE_OTHER): Payer: Medicare Other

## 2021-06-21 DIAGNOSIS — Z23 Encounter for immunization: Secondary | ICD-10-CM

## 2021-06-22 ENCOUNTER — Ambulatory Visit
Admission: RE | Admit: 2021-06-22 | Discharge: 2021-06-22 | Disposition: A | Discharge status: Home / Self Care | Payer: Medicare Other | Source: Ambulatory Visit | Attending: Emergency Medicine | Admitting: Emergency Medicine

## 2021-06-22 DIAGNOSIS — R918 Other nonspecific abnormal finding of lung field: Secondary | ICD-10-CM

## 2021-06-22 DIAGNOSIS — R911 Solitary pulmonary nodule: Secondary | ICD-10-CM | POA: Diagnosis not present

## 2021-06-22 DIAGNOSIS — I700 Atherosclerosis of aorta: Secondary | ICD-10-CM | POA: Diagnosis not present

## 2021-06-24 ENCOUNTER — Telehealth: Payer: Self-pay | Admitting: Emergency Medicine

## 2021-06-24 ENCOUNTER — Other Ambulatory Visit: Payer: Self-pay | Admitting: Emergency Medicine

## 2021-06-24 DIAGNOSIS — R918 Other nonspecific abnormal finding of lung field: Secondary | ICD-10-CM

## 2021-06-24 NOTE — Telephone Encounter (Signed)
Please let him know that I reviewed his CT scan.  The small nodule that was initially found in 2021 is now resolved.  There is a new 7 mm area of nodular infiltrate in the right lower lobe, question inflammatory.  We should plan to repeat his CT scan of the chest in 6 months without contrast to ensure that this area is resolved or stable in size.  Please arrange for repeat CT chest if he agrees.

## 2021-06-24 NOTE — Telephone Encounter (Signed)
Spoke with pt who is requesting results from CT completed yesterday. Dr. Lamonte Sakai could you please advise?

## 2021-06-24 NOTE — Telephone Encounter (Signed)
ATC-- unable to leave vm due to mailbox not being setup.  Will call back.

## 2021-06-25 NOTE — Telephone Encounter (Signed)
I have attempted to call the pt but he has a VM that has not been set up.  Will try back later.

## 2021-06-30 NOTE — Telephone Encounter (Signed)
Spoke with pt and reviewed CT results as dictated by Dr. Lamonte Sakai. Placed order for CT. Pt stated understanding. Nothing further needed at this time.

## 2021-07-06 LAB — CUP PACEART REMOTE DEVICE CHECK
Date Time Interrogation Session: 20221119155705
Implantable Pulse Generator Implant Date: 20220607

## 2021-07-12 ENCOUNTER — Ambulatory Visit (INDEPENDENT_AMBULATORY_CARE_PROVIDER_SITE_OTHER): Payer: Medicare Other

## 2021-07-12 DIAGNOSIS — R55 Syncope and collapse: Secondary | ICD-10-CM | POA: Diagnosis not present

## 2021-07-19 ENCOUNTER — Telehealth: Payer: Self-pay | Admitting: Family Medicine

## 2021-07-19 ENCOUNTER — Other Ambulatory Visit: Payer: Self-pay

## 2021-07-19 MED ORDER — COLCHICINE 0.6 MG PO TABS: 0.6000 mg | ORAL_TABLET | Freq: Every day | ORAL | 0 refills | Status: DC

## 2021-07-19 NOTE — Telephone Encounter (Signed)
Pt wife Joe Davis is calling and I informed  joann he may needs an appt since it has been over a year since last seen dr burchette and felt like md would give ok for medication. Pt is having trouble walking and wife said its his gout flare up and would like colchine 0.6 mg   [id number] - [place], [place] [address] Phone:  [phone]  Fax:  [phone]

## 2021-07-19 NOTE — Telephone Encounter (Signed)
Rx sent and pt's partner stated they would call back to schedule an appointment.

## 2021-07-21 NOTE — Progress Notes (Signed)
Carelink Summary Report / Loop Recorder 

## 2021-07-27 ENCOUNTER — Telehealth: Payer: Self-pay

## 2021-07-27 NOTE — Telephone Encounter (Signed)
ILR alert for AF, 4.5hrs on 07/26/21 at 0744. Rates controlled. On anticoagulation for DVT/PE, Xarelto. Presenting rhythm SR. Routing as new finding.  Patient has know history as noted in Dr. [name] in office note 01/19/21. Successful telephone encounter to patient's wife (on [place]) as patient is not available at this time. Wife states patient had no complaints during time of episode. Compliant with xarelto (DVT) and Toprol XL 25mg  Q day. Will route to Dr. Lovena Le for review.

## 2021-08-06 ENCOUNTER — Ambulatory Visit (INDEPENDENT_AMBULATORY_CARE_PROVIDER_SITE_OTHER): Payer: Medicare Other

## 2021-08-06 DIAGNOSIS — R55 Syncope and collapse: Secondary | ICD-10-CM

## 2021-08-06 LAB — CUP PACEART REMOTE DEVICE CHECK
Date Time Interrogation Session: 20221222155803
Implantable Pulse Generator Implant Date: 20220607

## 2021-08-17 NOTE — Progress Notes (Signed)
Carelink Summary Report / Loop Recorder 

## 2021-09-01 ENCOUNTER — Other Ambulatory Visit: Payer: Self-pay | Admitting: Family Medicine

## 2021-09-06 NOTE — Progress Notes (Signed)
Cardiology Office Note:    Date:  09/07/2021   ID:  Joe Davis, DOB 1946/01/07, MRN [id number]  PCP:  [name], MD  [facility] Providers Cardiologist:  Jenkins Rouge, MD     Referring MD: [name], MD   Chief Complaint:  F/u for CAD, AFib, Syncope, SVT    Patient Profile: Coronary artery disease  CAC score 1657 (12/2019) Myoview 5/21: low risk  Paroxysmal atrial fibrillation Supraventricular Tachycardia Monitor 3/22: several brief episodes  Diastolic dysfunction on echocardiogram  Recurrent DVT and pulmonary embolism  anticoagulation w/ Rivaroxaban  Hyperlipidemia  Hx of vasovagal syncope Unexplained syncope  S/p ILR (Dr. Lovena Le in 6/22) S/p R THR Bradycardia  OSA on CPAP Gout  BPH Aortic atherosclerosis  Lung nodule   Prior CV Studies: Zio Patch monitor 10/21/20 NSR, avg HR 78; NSVT (4 beats); 71 Supraventricular Tachycardia episodes (fastest 11 beats at 194 bpm, longest 14 sec at 123 bpm); rare PVCs, PACs  Myoview 01/09/20 EF 55, no ischemia; low risk  CAC Score 01/03/20 CAC score 1657 (85th percentile) RLL 6 mm nodule >> F/u CT 6 mos Aortic atherosclerosis   Echocardiogram 09/26/19 EF 55-60, mild LVH, Gr 1 DD, normal PASP, trivial AI, AV sclerosis w/o AS   History of Present Illness:   Joe Davis is a 76 y.o. male with the above problem list.  He was last seen by [name] in 5/21. A CAC score was obtained and was elevated.  A f/u Myoview was low risk and neg for ischemia.  He had recurrent syncope and saw Dr. Lovena Le.  He had an ILR implanted in 6/22.  He returns for f/u.  He is here with his wife, Remo Lipps.  He is doing well without chest pain, orthopnea, significant leg edema.  He has some shortness of breath with certain activities.  However, he remains very active.  He has been duck hunting.  He does a lot of outdoor work including chopping wood.   He has not noted any significant change in his breathing.  He has not had any further  syncope.    Past Medical History:  Diagnosis Date   Arthritis    "knees, hips" (10/10/2014)   Basal cell carcinoma    BPH (benign prostatic hyperplasia)    Bradycardia vasovagal response 10 yrs ago   asymptomatic   Diastolic dysfunction    Per echo in 2008   DVT (deep venous thrombosis) ([place]) 1979   RLE   Erectile dysfunction    History of gout    History of nuclear stress test    ETT-Myoview 3/17: Hypertensive blood pressure response to exercise, normal perfusion, EF 60%, low risk study   Hypercholesterolemia    ? statin intolerant. May tolerate Lipitor   Normal nuclear stress test May 2011   Obesity    Pulmonary embolism ([place]) 1979   Syncope and collapse 8-10 yrs ago   Current Medications: Current Meds  Medication Sig   acetaminophen (TYLENOL) 500 MG tablet Take 1,000 mg by mouth every 8 (eight) hours as needed for mild pain or headache.   amoxicillin (AMOXIL) 500 MG tablet Take 4 tabs (2G) by mouth 1 hour before dental procedure   colchicine 0.6 MG tablet Take 1 tablet (0.6 mg total) by mouth daily.   diclofenac Sodium (VOLTAREN) 1 % GEL Apply 2 g topically every 30 (thirty) days.   tadalafil (CIALIS) 5 MG tablet Take 5 mg by mouth daily as needed for erectile dysfunction.   [  DISCONTINUED] metoprolol succinate (TOPROL-XL) 25 MG 24 hr tablet Take 1 tablet (25 mg total) by mouth daily. Please make overdue appt with [name] before anymore refills. Thank you 1st attempt   [DISCONTINUED] XARELTO 20 MG TABS tablet TAKE 1 TABLET EVERY DAY WITH SUPPER    Allergies:   Patient has no known allergies.   Social History   Tobacco Use   Smoking status: Former    Packs/day: 1.00    Years: 10.00    Pack years: 10.00    Types: Cigarettes    Quit date: 08/15/1977    Years since quitting: 44.0   Smokeless tobacco: Never  Vaping Use   Vaping Use: Never used  Substance Use Topics   Alcohol use: Yes    Alcohol/week: 7.0 standard drinks    Types: 7 Shots of liquor per week   Drug  use: No    Family Hx: The patient's He was adopted. Family history is unknown by patient.  Review of Systems  Gastrointestinal:  Negative for hematochezia and melena.  Genitourinary:  Negative for hematuria.    EKGs/Labs/Other Test Reviewed:    EKG:  EKG is  ordered today.  The ekg ordered today demonstrates sinus bradycardia, HR 53, left axis deviation, poor wave progression, PVC, QTC 399  Recent Labs: 10/15/2020: Platelets 174 01/05/2021: BUN 17; Creatinine, Ser 0.70; Hemoglobin 13.6; Potassium 4.2; Sodium 143   Recent Lipid Panel No results for input(s): CHOL, TRIG, HDL, VLDL, LDLCALC, LDLDIRECT in the last 8760 hours.   Risk Assessment/Calculations:    CHA2DS2-VASc Score = 3   This indicates a 3.2% annual risk of stroke. The patient's score is based upon: CHF History: 0 HTN History: 0 Diabetes History: 0 Stroke History: 0 Vascular Disease History: 1 Age Score: 2 Gender Score: 0        Physical Exam:    VS:  BP 110/62 (BP Location: Left Arm, Patient Position: Sitting, Cuff Size: Normal)    Pulse (!) 53    Ht 5' 10.5" (1.791 m)    Wt 230 lb 3.2 oz (104.4 kg)    BMI 32.56 kg/m     Wt Readings from Last 3 Encounters:  09/07/21 230 lb 3.2 oz (104.4 kg)  01/19/21 217 lb (98.4 kg)  01/05/21 217 lb (98.4 kg)    Constitutional:      Appearance: Healthy appearance. Not in distress.  Neck:     Vascular: No carotid bruit or JVR. JVD normal.  Pulmonary:     Effort: Pulmonary effort is normal.     Breath sounds: No wheezing. No rales.  Cardiovascular:     Normal rate. Regular rhythm. Normal S1. Normal S2.      Murmurs: There is no murmur.  Edema:    Peripheral edema absent.  Abdominal:     Palpations: Abdomen is soft.  Skin:    General: Skin is warm and dry.  Neurological:     General: No focal deficit present.     Mental Status: Alert and oriented to person, place and time.     Cranial Nerves: Cranial nerves are intact.        ASSESSMENT & PLAN:   Paroxysmal  atrial fibrillation (HCC) Maintaining sinus rhythm.  He has a low burden on ILR interrogation.  He is on long-term anticoagulation due to history of recurrent DVT and pulmonary embolism.  He is tolerating anticoagulation well.  Obtain follow-up CMET, CBC today.  Coronary artery disease involving native coronary artery of native heart without angina  pectoris High calcium score on CT in 2021.  Myoview in 2021 was low risk.  He is doing well without anginal symptoms.  His electrocardiogram is unchanged.  He does not require aspirin as he is on rivaroxaban.  He is not currently on statin therapy.  We discussed the importance of lowering cholesterol to reduce overall risk.  Other hyperlipidemia Obtain CMET, lipids today.  Goal LDL <70.  If his LDL is above 70, start rosuvastatin 10 mg daily.  Syncope and collapse Status post ILR.  This is followed by Dr. Lovena Le with EP.  He has not had further syncope since implantation.  Supraventricular tachycardia (HCC) Overall quiescent on low-dose beta-blocker.  He seems to be tolerating this well.  Continue metoprolol succinate 25 mg daily.          Dispo:  Return in about 1 year (around 09/07/2022) for Routine follow up in 1 year with [name]. .   Medication Adjustments/Labs and Tests Ordered: Current medicines are reviewed at length with the patient today.  Concerns regarding medicines are outlined above.  Tests Ordered: Orders Placed This Encounter  Procedures   Comprehensive metabolic panel   Lipid panel   CBC   EKG 12-Lead   Medication Changes: Meds ordered this encounter  Medications   metoprolol succinate (TOPROL-XL) 25 MG 24 hr tablet    Sig: Take 1 tablet (25 mg total) by mouth daily.    Dispense:  90 tablet    Refill:  3   rivaroxaban (XARELTO) 20 MG TABS tablet    Sig: TAKE 1 TABLET EVERY DAY WITH SUPPER    Dispense:  90 tablet    Refill:  3   Signed, [name], PA-C  09/07/2021 12:08 PM    [address], [place] [place], [place]  [address] Phone: [phone]; Fax: [phone]

## 2021-09-07 ENCOUNTER — Encounter: Payer: Self-pay | Admitting: Physician Assistant

## 2021-09-07 ENCOUNTER — Other Ambulatory Visit: Payer: Self-pay

## 2021-09-07 ENCOUNTER — Ambulatory Visit (INDEPENDENT_AMBULATORY_CARE_PROVIDER_SITE_OTHER): Payer: Medicare Other | Admitting: Physician Assistant

## 2021-09-07 VITALS — BP 110/62 | HR 53 | Ht 70.5 in | Wt 230.2 lb

## 2021-09-07 DIAGNOSIS — I471 Supraventricular tachycardia: Secondary | ICD-10-CM | POA: Diagnosis not present

## 2021-09-07 DIAGNOSIS — I2510 Atherosclerotic heart disease of native coronary artery without angina pectoris: Secondary | ICD-10-CM

## 2021-09-07 DIAGNOSIS — I480 Paroxysmal atrial fibrillation: Secondary | ICD-10-CM | POA: Diagnosis not present

## 2021-09-07 DIAGNOSIS — R55 Syncope and collapse: Secondary | ICD-10-CM | POA: Diagnosis not present

## 2021-09-07 DIAGNOSIS — E7849 Other hyperlipidemia: Secondary | ICD-10-CM

## 2021-09-07 LAB — COMPREHENSIVE METABOLIC PANEL
ALT: 14 IU/L (ref 0–44)
AST: 25 IU/L (ref 0–40)
Albumin/Globulin Ratio: 1.9 (ref 1.2–2.2)
Albumin: 4.3 g/dL (ref 3.7–4.7)
Alkaline Phosphatase: 73 IU/L (ref 44–121)
BUN/Creatinine Ratio: 19 (ref 10–24)
BUN: 17 mg/dL (ref 8–27)
Bilirubin Total: 0.5 mg/dL (ref 0.0–1.2)
CO2: 25 mmol/L (ref 20–29)
Calcium: 9 mg/dL (ref 8.6–10.2)
Chloride: 105 mmol/L (ref 96–106)
Creatinine, Ser: 0.89 mg/dL (ref 0.76–1.27)
Globulin, Total: 2.3 g/dL (ref 1.5–4.5)
Glucose: 107 mg/dL — ABNORMAL HIGH (ref 70–99)
Potassium: 4.6 mmol/L (ref 3.5–5.2)
Sodium: 141 mmol/L (ref 134–144)
Total Protein: 6.6 g/dL (ref 6.0–8.5)
eGFR: 88 mL/min/{1.73_m2} (ref >59)

## 2021-09-07 LAB — LIPID PANEL
Chol/HDL Ratio: 4.6 ratio (ref 0.0–5.0)
Cholesterol, Total: 201 mg/dL — ABNORMAL HIGH (ref 100–199)
HDL: 44 mg/dL (ref >39)
LDL Chol Calc (NIH): 132 mg/dL — ABNORMAL HIGH (ref 0–99)
Triglycerides: 138 mg/dL (ref 0–149)
VLDL Cholesterol Cal: 25 mg/dL (ref 5–40)

## 2021-09-07 LAB — CBC
Hematocrit: 45 % (ref 37.5–51.0)
Hemoglobin: 14.9 g/dL (ref 13.0–17.7)
MCH: 29.2 pg (ref 26.6–33.0)
MCHC: 33.1 g/dL (ref 31.5–35.7)
MCV: 88 fL (ref 79–97)
Platelets: 200 10*3/uL (ref 150–450)
RBC: 5.1 x10E6/uL (ref 4.14–5.80)
RDW: 13.8 % (ref 11.6–15.4)
WBC: 6 10*3/uL (ref 3.4–10.8)

## 2021-09-07 MED ORDER — METOPROLOL SUCCINATE ER 25 MG PO TB24: 25.0000 mg | ORAL_TABLET | Freq: Every day | ORAL | 3 refills | Status: DC

## 2021-09-07 MED ORDER — RIVAROXABAN 20 MG PO TABS: ORAL_TABLET | ORAL | 3 refills | Status: DC

## 2021-09-07 NOTE — Assessment & Plan Note (Signed)
Obtain CMET, lipids today.  Goal LDL <70.  If his LDL is above 70, start rosuvastatin 10 mg daily.

## 2021-09-07 NOTE — Assessment & Plan Note (Signed)
Maintaining sinus rhythm.  He has a low burden on ILR interrogation.  He is on long-term anticoagulation due to history of recurrent DVT and pulmonary embolism.  He is tolerating anticoagulation well.  Obtain follow-up CMET, CBC today.

## 2021-09-07 NOTE — Assessment & Plan Note (Signed)
Status post ILR.  This is followed by Dr. Lovena Le with EP.  He has not had further syncope since implantation.

## 2021-09-07 NOTE — Assessment & Plan Note (Signed)
Overall quiescent on low-dose beta-blocker.  He seems to be tolerating this well.  Continue metoprolol succinate 25 mg daily.

## 2021-09-07 NOTE — Assessment & Plan Note (Signed)
High calcium score on CT in 2021.  Myoview in 2021 was low risk.  He is doing well without anginal symptoms.  His electrocardiogram is unchanged.  He does not require aspirin as he is on rivaroxaban.  He is not currently on statin therapy.  We discussed the importance of lowering cholesterol to reduce overall risk.

## 2021-09-07 NOTE — Patient Instructions (Signed)
Medication Instructions:   Your physician recommends that you continue on your current medications as directed. Please refer to the Current Medication list given to you today.  *If you need a refill on your cardiac medications before your next appointment, please call your pharmacy*   Lab Work:  TODAY!!!!! CMET/LIPID/CBC  If you have labs (blood work) drawn today and your tests are completely normal, you will receive your results only by: [place] (if you have MyChart) OR A paper copy in the mail If you have any lab test that is abnormal or we need to change your treatment, we will call you to review the results.   Testing/Procedures:  None ordered.   Follow-Up: At [facility], you and your health needs are our priority.  As part of our continuing mission to provide you with exceptional heart care, we have created designated Provider Care Teams.  These Care Teams include your primary Cardiologist (physician) and Advanced Practice Providers (APPs -  Physician Assistants and Nurse Practitioners) who all work together to provide you with the care you need, when you need it.  We recommend signing up for the patient portal called "MyChart".  Sign up information is provided on this After Visit Summary.  MyChart is used to connect with patients for Virtual Visits (Telemedicine).  Patients are able to view lab/test results, encounter notes, upcoming appointments, etc.  Non-urgent messages can be sent to your provider as well.   To learn more about what you can do with MyChart, go to [email or web address].    Your next appointment:   1 year(s)  The format for your next appointment:   In Person  Provider:   Jenkins Rouge, MD     Other Instructions  Your physician wants you to follow-up in: 1 year with [name].  You will receive a reminder letter in the mail two months in advance. If you don't receive a letter, please call our office to schedule the follow-up appointment.

## 2021-09-08 ENCOUNTER — Other Ambulatory Visit: Payer: Self-pay | Admitting: *Deleted

## 2021-09-08 ENCOUNTER — Ambulatory Visit: Payer: Medicare Other

## 2021-09-08 DIAGNOSIS — E7849 Other hyperlipidemia: Secondary | ICD-10-CM

## 2021-09-08 MED ORDER — ROSUVASTATIN CALCIUM 10 MG PO TABS: 10.0000 mg | ORAL_TABLET | Freq: Every day | ORAL | 3 refills | Status: DC

## 2021-09-13 ENCOUNTER — Other Ambulatory Visit: Payer: Self-pay | Admitting: Cardiovascular Disease

## 2021-09-27 ENCOUNTER — Other Ambulatory Visit: Payer: Self-pay | Admitting: Family Medicine

## 2021-09-27 NOTE — Telephone Encounter (Signed)
Please advise. I do not see this on the current med list.

## 2021-10-11 ENCOUNTER — Ambulatory Visit (INDEPENDENT_AMBULATORY_CARE_PROVIDER_SITE_OTHER): Payer: Medicare Other

## 2021-10-11 DIAGNOSIS — R55 Syncope and collapse: Secondary | ICD-10-CM

## 2021-10-11 LAB — CUP PACEART REMOTE DEVICE CHECK
Date Time Interrogation Session: 20230226230549
Implantable Pulse Generator Implant Date: 20220607

## 2021-10-18 NOTE — Progress Notes (Signed)
Carelink Summary Report / Loop Recorder 

## 2021-11-12 ENCOUNTER — Telehealth: Payer: Self-pay

## 2021-11-12 NOTE — Telephone Encounter (Signed)
Call received from Pt's wife. ? ?Per wife Pt had a near syncopal episode this morning 11/12/2021 between 1:15 am and 1:30 am.   ?Per wife, she laid Pt on the floor.  He was white and diaphoretic.   ?His HR was 30-40 beats per minute and his BP was 85/39. ? ?Wife states they called EMS-they thought he may be having an allergic reaction to a chemical he used that day.   ? ?Per wife Pt is ok now.  We received a report from Pt's loop recorder which did not indicate any pauses (parameters for Loletha Grayer are <30 BMP for >= 8 beats). ? ?Wife states she thinks it was probably a vasovagal episode.  She was not able to use the symptom activator. ? ?Pt is overdue for follow up with [facility].  Will send message to scheduling. ? ? ?

## 2021-11-12 NOTE — Telephone Encounter (Signed)
Pt wife called stating that he had a syncope episode last night. I let him speak with Sonia Baller, rn. ?

## 2021-11-15 ENCOUNTER — Ambulatory Visit (INDEPENDENT_AMBULATORY_CARE_PROVIDER_SITE_OTHER): Payer: Medicare Other

## 2021-11-15 DIAGNOSIS — R55 Syncope and collapse: Secondary | ICD-10-CM | POA: Diagnosis not present

## 2021-11-16 ENCOUNTER — Ambulatory Visit (INDEPENDENT_AMBULATORY_CARE_PROVIDER_SITE_OTHER): Payer: Medicare Other | Admitting: Family Medicine

## 2021-11-16 ENCOUNTER — Encounter: Payer: Self-pay | Admitting: Family Medicine

## 2021-11-16 VITALS — BP 140/68 | HR 56 | Temp 97.7°F | Ht 70.5 in | Wt 220.0 lb

## 2021-11-16 DIAGNOSIS — E7849 Other hyperlipidemia: Secondary | ICD-10-CM | POA: Diagnosis not present

## 2021-11-16 DIAGNOSIS — Z1159 Encounter for screening for other viral diseases: Secondary | ICD-10-CM | POA: Diagnosis not present

## 2021-11-16 DIAGNOSIS — I2510 Atherosclerotic heart disease of native coronary artery without angina pectoris: Secondary | ICD-10-CM

## 2021-11-16 DIAGNOSIS — R42 Dizziness and giddiness: Secondary | ICD-10-CM

## 2021-11-16 DIAGNOSIS — Z86718 Personal history of other venous thrombosis and embolism: Secondary | ICD-10-CM

## 2021-11-16 LAB — LIPID PANEL
Cholesterol: 133 mg/dL (ref 0–200)
HDL: 44.9 mg/dL (ref >39.00)
LDL Cholesterol: 75 mg/dL (ref 0–99)
NonHDL: 88.23
Total CHOL/HDL Ratio: 3
Triglycerides: 66 mg/dL (ref 0.0–149.0)
VLDL: 13.2 mg/dL (ref 0.0–40.0)

## 2021-11-16 LAB — HEPATIC FUNCTION PANEL
ALT: 19 U/L (ref 0–53)
AST: 25 U/L (ref 0–37)
Albumin: 4.5 g/dL (ref 3.5–5.2)
Alkaline Phosphatase: 55 U/L (ref 39–117)
Bilirubin, Direct: 0.1 mg/dL (ref 0.0–0.3)
Total Bilirubin: 0.5 mg/dL (ref 0.2–1.2)
Total Protein: 7.1 g/dL (ref 6.0–8.3)

## 2021-11-16 LAB — CUP PACEART REMOTE DEVICE CHECK
Date Time Interrogation Session: 20230331230732
Implantable Pulse Generator Implant Date: 20220607

## 2021-11-16 NOTE — Progress Notes (Signed)
? ?Established Patient Office Visit ? ?Subjective:  ?Patient ID: NORMAL RECINOS, male    DOB: 1946/04/28  Age: 76 y.o. MRN: [id number] ? ?CC:  ?Chief Complaint  ?Patient presents with  ? Annual Exam  ? ? ?HPI ?Joe Davis presents for medical follow-up.  He has history of atrial fibrillation, lower extremity DVT, CAD, BPH, hyperlipidemia.  He has history of very high coronary artery calcium score 1657.  History of vasovagal syncope.  History of loop recorder 6/22.  Recent episode of near syncope when on toilet.  He has follow-up with cardiologist soon.  His significant other noted that he was very bradycardic with heart rate around 40 with recent near syncopal episode. ? ?Denies any recent chest pains.  Had recent elevated lipids per cardiology back in January was placed on rosuvastatin 10 mg daily.  Tolerating well.  Needs follow-up lipids and hepatic.  No history of hepatitis C screening.  Low risk.  No history of Shingrix vaccine.  He does plan to get 1 more colonoscopy and apparently has recently been scheduled per GI ? ?He enjoys gardening and plans to start his vegetable garden soon in the next couple weeks ? ?Past Medical History:  ?Diagnosis Date  ? Arthritis   ? "knees, hips" (10/10/2014)  ? Basal cell carcinoma   ? BPH (benign prostatic hyperplasia)   ? Bradycardia vasovagal response 10 yrs ago  ? asymptomatic  ? Diastolic dysfunction   ? Per echo in 2008  ? DVT (deep venous thrombosis) ([place]) 1979  ? RLE  ? Erectile dysfunction   ? History of gout   ? History of nuclear stress test   ? ETT-Myoview 3/17: Hypertensive blood pressure response to exercise, normal perfusion, EF 60%, low risk study  ? Hypercholesterolemia   ? ? statin intolerant. May tolerate Lipitor  ? Normal nuclear stress test May 2011  ? Obesity   ? Pulmonary embolism ([place]) 1979  ? Syncope and collapse 8-10 yrs ago  ? ? ?Past Surgical History:  ?Procedure Laterality Date  ? CARDIOVASCULAR STRESS TEST  May 2011  ? EF 73%; No ischemia  ?  COLONOSCOPY  2013  ? IVC FILTER INSERTION N/A 10/06/2020  ? Procedure: IVC FILTER INSERTION;  Surgeon: Serafina Mitchell, MD;  Location: [place] [facility];  Service: Cardiovascular;  Laterality: N/A;  w/ IVC Venogram  ? IVC FILTER REMOVAL N/A 01/05/2021  ? Procedure: IVC FILTER REMOVAL;  Surgeon: Serafina Mitchell, MD;  Location: [place] [facility];  Service: Cardiovascular;  Laterality: N/A;  ? JOINT REPLACEMENT    ? KNEE ARTHROSCOPY Right 10/03/2012  ? Procedure: RIGHT KNEE ARTHROSCOPY WITH DEBRIDEMENT;  Surgeon: [name], MD;  Location: [facility];  Service: Orthopedics;  Laterality: Right;  WITH DEBRIDEMENT  ? MOHS SURGERY  6 yrs   ? nose  ? TOTAL HIP ARTHROPLASTY Right 10/06/2014  ? ? ?Family History  ?Adopted: Yes  ?Family history unknown: Yes  ? ? ?Social History  ? ?Socioeconomic History  ? Marital status: Divorced  ?  Spouse name: Not on file  ? Number of children: Not on file  ? Years of education: Not on file  ? Highest education level: Not on file  ?Occupational History  ? Not on file  ?Tobacco Use  ? Smoking status: Former  ?  Packs/day: 1.00  ?  Years: 10.00  ?  Pack years: 10.00  ?  Types: Cigarettes  ?  Quit date: 08/15/1977  ?  Years since quitting:  44.2  ? Smokeless tobacco: Never  ?Vaping Use  ? Vaping Use: Never used  ?Substance and Sexual Activity  ? Alcohol use: Yes  ?  Alcohol/week: 7.0 standard drinks  ?  Types: 7 Shots of liquor per week  ? Drug use: No  ? Sexual activity: Yes  ?Other Topics Concern  ? Not on file  ?Social History Narrative  ? Not on file  ? ?Social Determinants of Health  ? ?Financial Resource Strain: Low Risk   ? Difficulty of Paying Living Expenses: Not hard at all  ?Food Insecurity: No Food Insecurity  ? Worried About Charity fundraiser in the Last Year: Never true  ? Ran Out of Food in the Last Year: Never true  ?Transportation Needs: No Transportation Needs  ? Lack of Transportation (Medical): No  ? Lack of Transportation (Non-Medical): No  ?Physical Activity: Unknown  ?  Days of Exercise per Week: 5 days  ? Minutes of Exercise per Session: Not on file  ?Stress: No Stress Concern Present  ? Feeling of Stress : Not at all  ?Social Connections: Moderately Isolated  ? Frequency of Communication with Friends and Family: More than three times a week  ? Frequency of Social Gatherings with Friends and Family: More than three times a week  ? Attends Religious Services: Never  ? Active Member of Clubs or Organizations: No  ? Attends Archivist Meetings: Never  ? Marital Status: Married  ?Intimate Partner Violence: Not At Risk  ? Fear of Current or Ex-Partner: No  ? Emotionally Abused: No  ? Physically Abused: No  ? Sexually Abused: No  ? ? ?Outpatient Medications Prior to Visit  ?Medication Sig Dispense Refill  ? acetaminophen (TYLENOL) 500 MG tablet Take 1,000 mg by mouth every 8 (eight) hours as needed for mild pain or headache.    ? amoxicillin (AMOXIL) 500 MG tablet Take 4 tabs (2G) by mouth 1 hour before dental procedure    ? Colchicine (MITIGARE) 0.6 MG CAPS TAKE 1 CAPSULE BY MOUTH DAILY 30 capsule 2  ? diclofenac Sodium (VOLTAREN) 1 % GEL Apply 2 g topically every 30 (thirty) days.    ? metoprolol succinate (TOPROL-XL) 25 MG 24 hr tablet Take 1 tablet (25 mg total) by mouth daily. 90 tablet 3  ? rivaroxaban (XARELTO) 20 MG TABS tablet TAKE 1 TABLET EVERY DAY WITH SUPPER 90 tablet 3  ? rosuvastatin (CRESTOR) 10 MG tablet Take 1 tablet (10 mg total) by mouth daily. 30 tablet 3  ? tadalafil (CIALIS) 5 MG tablet Take 5 mg by mouth daily as needed for erectile dysfunction.  10  ? ?No facility-administered medications prior to visit.  ? ? ?No Known Allergies ? ?ROS ?Review of Systems  ?Constitutional:  Negative for chills and fever.  ?Respiratory:  Negative for cough and shortness of breath.   ?Cardiovascular:  Negative for chest pain, palpitations and leg swelling.  ?Genitourinary:  Negative for dysuria.  ?Neurological:  Positive for dizziness.  ?     See HPI  ? ?  ?Objective:  ?   ?Physical Exam ?Constitutional:   ?   Appearance: He is well-developed.  ?Eyes:  ?   Pupils: Pupils are equal, round, and reactive to light.  ?Neck:  ?   Thyroid: No thyromegaly.  ?Cardiovascular:  ?   Rate and Rhythm: Normal rate and regular rhythm.  ?Pulmonary:  ?   Effort: Pulmonary effort is normal. No respiratory distress.  ?   Breath sounds: Normal  breath sounds. No wheezing or rales.  ?Musculoskeletal:  ?   Cervical back: Neck supple.  ?   Right lower leg: No edema.  ?   Left lower leg: No edema.  ?Neurological:  ?   General: No focal deficit present.  ?   Mental Status: He is alert.  ? ? ?BP 140/68 (BP Location: Left Arm, Cuff Size: Normal)   Pulse (!) 56   Temp 97.7 ?F (36.5 ?C) (Oral)   Ht 5' 10.5" (1.791 m)   Wt 220 lb (99.8 kg)   SpO2 96%   BMI 31.12 kg/m?  ?Wt Readings from Last 3 Encounters:  ?11/16/21 220 lb (99.8 kg)  ?09/07/21 230 lb 3.2 oz (104.4 kg)  ?01/19/21 217 lb (98.4 kg)  ? ? ? ?Health Maintenance Due  ?Topic Date Due  ? Hepatitis C Screening  Never done  ? Zoster Vaccines- Shingrix (1 of 2) Never done  ? COVID-19 Vaccine (4 - Booster for Pfizer series) 07/06/2020  ? ? ?There are no preventive care reminders to display for this patient. ? ?Lab Results  ?Component Value Date  ? TSH 1.92 10/26/2015  ? ?Lab Results  ?Component Value Date  ? WBC 6.0 09/07/2021  ? HGB 14.9 09/07/2021  ? HCT 45.0 09/07/2021  ? MCV 88 09/07/2021  ? PLT 200 09/07/2021  ? ?Lab Results  ?Component Value Date  ? NA 141 09/07/2021  ? K 4.6 09/07/2021  ? CO2 25 09/07/2021  ? GLUCOSE 107 (H) 09/07/2021  ? BUN 17 09/07/2021  ? CREATININE 0.89 09/07/2021  ? BILITOT 0.5 09/07/2021  ? ALKPHOS 73 09/07/2021  ? AST 25 09/07/2021  ? ALT 14 09/07/2021  ? PROT 6.6 09/07/2021  ? ALBUMIN 4.3 09/07/2021  ? CALCIUM 9.0 09/07/2021  ? ANIONGAP 11 10/15/2020  ? EGFR 88 09/07/2021  ? GFR 91.78 07/28/2015  ? ?Lab Results  ?Component Value Date  ? CHOL 201 (H) 09/07/2021  ? ?Lab Results  ?Component Value Date  ? HDL 44 09/07/2021   ? ?Lab Results  ?Component Value Date  ? LDLCALC 132 (H) 09/07/2021  ? ?Lab Results  ?Component Value Date  ? TRIG 138 09/07/2021  ? ?Lab Results  ?Component Value Date  ? CHOLHDL 4.6 09/07/2021  ? ?Lab Results

## 2021-11-16 NOTE — Progress Notes (Signed)
freestyle

## 2021-11-17 LAB — HEPATITIS C ANTIBODY
Hepatitis C Ab: NONREACTIVE
SIGNAL TO CUT-OFF: 0.11 (ref <1.00)

## 2021-11-22 ENCOUNTER — Other Ambulatory Visit: Payer: Self-pay | Admitting: Physician Assistant

## 2021-11-24 ENCOUNTER — Ambulatory Visit: Payer: Medicare Other

## 2021-11-24 ENCOUNTER — Ambulatory Visit (INDEPENDENT_AMBULATORY_CARE_PROVIDER_SITE_OTHER): Payer: Medicare Other

## 2021-11-24 ENCOUNTER — Other Ambulatory Visit: Payer: Medicare Other

## 2021-11-24 VITALS — Ht 70.5 in | Wt 220.0 lb

## 2021-11-24 DIAGNOSIS — Z0000 Encounter for general adult medical examination without abnormal findings: Secondary | ICD-10-CM | POA: Diagnosis not present

## 2021-11-24 NOTE — Progress Notes (Signed)
? ?Subjective:  ? Joe Davis is a 76 y.o. male who presents for Medicare Annual/Subsequent preventive examination. ? ?Review of Systems    ?Virtual Visit via Telephone Note ? ?I connected with  Joe Davis on 11/24/21 at  2:45 PM EDT by telephone and verified that I am speaking with the correct person using two identifiers. ? ?Location: ?Patient: Home ?Provider: Office ?Persons participating in the virtual visit: patient/Nurse Health Advisor ?  ?I discussed the limitations, risks, security and privacy concerns of performing an evaluation and management service by telephone and the availability of in person appointments. The patient expressed understanding and agreed to proceed. ? ?Interactive audio and video telecommunications were attempted between this nurse and patient, however failed, due to patient having technical difficulties OR patient did not have access to video capability.  We continued and completed visit with audio only. ? ?Some vital signs may be absent or patient reported.  ? ?Criselda Peaches, LPN  ?Cardiac Risk Factors include: advanced age (>85mn, >>62women);male gender ? ?   ?Objective:  ?  ?Today's Vitals  ? 11/24/21 1449  ?Weight: 220 lb (99.8 kg)  ?Height: 5' 10.5" (1.791 m)  ? ?Body mass index is 31.12 kg/m?. ? ? ?  11/24/2021  ?  3:04 PM 01/05/2021  ?  5:58 AM 11/18/2020  ?  1:53 PM 10/06/2020  ?  5:52 AM 09/18/2019  ?  7:27 PM 11/23/2016  ?  8:28 PM 08/10/2015  ?  9:07 PM  ?Advanced Directives  ?Does Patient Have a Medical Advance Directive? Yes Yes Yes Yes Yes Yes No  ?Type of AParamedicof [place]Living will [place] MountainLiving will Healthcare Power of AGlen JeanLiving will H[place]Living will HSt. DavidLiving will   ?Does patient want to make changes to medical advance directive? No - Patient declined No - Patient declined   No - Patient declined No - Patient declined   ?Copy  of HQ[place]in Chart? No - copy requested No - copy requested No - copy requested No - copy requested No - copy requested, Physician notified No - copy requested   ?Would patient like information on creating a medical advance directive?       No - patient declined information  ? ? ?Current Medications (verified) ?Outpatient Encounter Medications as of 11/24/2021  ?Medication Sig  ? acetaminophen (TYLENOL) 500 MG tablet Take 1,000 mg by mouth every 8 (eight) hours as needed for mild pain or headache.  ? amoxicillin (AMOXIL) 500 MG tablet Take 4 tabs (2G) by mouth 1 hour before dental procedure  ? Colchicine (MITIGARE) 0.6 MG CAPS TAKE 1 CAPSULE BY MOUTH DAILY  ? diclofenac Sodium (VOLTAREN) 1 % GEL Apply 2 g topically every 30 (thirty) days.  ? metoprolol succinate (TOPROL-XL) 25 MG 24 hr tablet Take 1 tablet (25 mg total) by mouth daily.  ? rivaroxaban (XARELTO) 20 MG TABS tablet TAKE 1 TABLET EVERY DAY WITH SUPPER  ? rosuvastatin (CRESTOR) 10 MG tablet TAKE 1 TABLET(10 MG) BY MOUTH DAILY  ? tadalafil (CIALIS) 5 MG tablet Take 5 mg by mouth daily as needed for erectile dysfunction.  ? ?No facility-administered encounter medications on file as of 11/24/2021.  ? ? ?Allergies (verified) ?Patient has no known allergies.  ? ?History: ?Past Medical History:  ?Diagnosis Date  ? Arthritis   ? "knees, hips" (10/10/2014)  ? Basal cell carcinoma   ? BPH (benign prostatic  hyperplasia)   ? Bradycardia vasovagal response 10 yrs ago  ? asymptomatic  ? Diastolic dysfunction   ? Per echo in 2008  ? DVT (deep venous thrombosis) ([place]) 1979  ? RLE  ? Erectile dysfunction   ? History of gout   ? History of nuclear stress test   ? ETT-Myoview 3/17: Hypertensive blood pressure response to exercise, normal perfusion, EF 60%, low risk study  ? Hypercholesterolemia   ? ? statin intolerant. May tolerate Lipitor  ? Normal nuclear stress test May 2011  ? Obesity   ? Pulmonary embolism ([place]) 1979  ? Syncope and collapse 8-10 yrs  ago  ? ?Past Surgical History:  ?Procedure Laterality Date  ? CARDIOVASCULAR STRESS TEST  May 2011  ? EF 73%; No ischemia  ? COLONOSCOPY  2013  ? IVC FILTER INSERTION N/A 10/06/2020  ? Procedure: IVC FILTER INSERTION;  Surgeon: Serafina Mitchell, MD;  Location: [facility];  Service: Cardiovascular;  Laterality: N/A;  w/ IVC Venogram  ? IVC FILTER REMOVAL N/A 01/05/2021  ? Procedure: IVC FILTER REMOVAL;  Surgeon: Serafina Mitchell, MD;  Location: [facility];  Service: Cardiovascular;  Laterality: N/A;  ? JOINT REPLACEMENT    ? KNEE ARTHROSCOPY Right 10/03/2012  ? Procedure: RIGHT KNEE ARTHROSCOPY WITH DEBRIDEMENT;  Surgeon: [name], MD;  Location: [facility];  Service: Orthopedics;  Laterality: Right;  WITH DEBRIDEMENT  ? MOHS SURGERY  6 yrs   ? nose  ? TOTAL HIP ARTHROPLASTY Right 10/06/2014  ? ?Family History  ?Adopted: Yes  ?Family history unknown: Yes  ? ?Social History  ? ?Socioeconomic History  ? Marital status: Divorced  ?  Spouse name: Not on file  ? Number of children: Not on file  ? Years of education: Not on file  ? Highest education level: Not on file  ?Occupational History  ? Not on file  ?Tobacco Use  ? Smoking status: Former  ?  Packs/day: 1.00  ?  Years: 10.00  ?  Pack years: 10.00  ?  Types: Cigarettes  ?  Quit date: 08/15/1977  ?  Years since quitting: 44.3  ? Smokeless tobacco: Never  ?Vaping Use  ? Vaping Use: Never used  ?Substance and Sexual Activity  ? Alcohol use: Yes  ?  Alcohol/week: 7.0 standard drinks  ?  Types: 7 Shots of liquor per week  ? Drug use: No  ? Sexual activity: Yes  ?Other Topics Concern  ? Not on file  ?Social History Narrative  ? Not on file  ? ?Social Determinants of Health  ? ?Financial Resource Strain: Low Risk   ? Difficulty of Paying Living Expenses: Not hard at all  ?Food Insecurity: No Food Insecurity  ? Worried About Charity fundraiser in the Last Year: Never true  ? Ran Out of Food in the Last Year: Never true  ?Transportation Needs: No Transportation  Needs  ? Lack of Transportation (Medical): No  ? Lack of Transportation (Non-Medical): No  ?Physical Activity: Sufficiently Active  ? Days of Exercise per Week: 5 days  ? Minutes of Exercise per Session: 60 min  ?Stress: No Stress Concern Present  ? Feeling of Stress : Not at all  ?Social Connections: Moderately Isolated  ? Frequency of Communication with Friends and Family: More than three times a week  ? Frequency of Social Gatherings with Friends and Family: More than three times a week  ? Attends Religious Services: Never  ? Active Member of  Clubs or Organizations: No  ? Attends Archivist Meetings: Never  ? Marital Status: Married  ? ? ? ?Clinical Intake: ?How often do you need to have someone help you when you read instructions, pamphlets, or other written materials from your doctor or pharmacy?: 1 - Never ?Diabetic?  No ? ?Interpreter Needed?: NoActivities of Daily Living ? ?  11/24/2021  ?  2:56 PM  ?In your present state of health, do you have any difficulty performing the following activities:  ?Hearing? 0  ?Vision? 0  ?Difficulty concentrating or making decisions? 0  ?Walking or climbing stairs? 0  ?Dressing or bathing? 0  ?Doing errands, shopping? 0  ?Preparing Food and eating ? N  ?Using the Toilet? N  ?In the past six months, have you accidently leaked urine? N  ?Do you have problems with loss of bowel control? N  ?Managing your Medications? N  ?Managing your Finances? N  ?Housekeeping or managing your Housekeeping? N  ? ? ?Patient Care Team: ?[name], MD as PCP - General (Family Medicine) ?Josue Hector, MD as PCP - Cardiology (Cardiology) ? ?Indicate any recent Medical Services you may have received from other than [facility] providers in the past year (date may be approximate). ? ?   ?Assessment:  ? This is a routine wellness examination for Lauri. ? ?Hearing/Vision screen ?Hearing Screening - Comments:: No hearing difficulty ?Vision Screening - Comments:: Wears glasses. Followed by  [facility] ? ?Dietary issues and exercise activities discussed: ?Exercise limited by: None identified ? ? Goals Addressed   ? ?  ?  ?  ?  ?  ? This Visit's Progress  ?   Patient Stated (pt-stated)

## 2021-11-24 NOTE — Patient Instructions (Addendum)
?[name] , ?Thank you for taking time to come for your Medicare Wellness Visit. I appreciate your ongoing commitment to your health goals. Please review the following plan we discussed and let me know if I can assist you in the future.  ? ?These are the goals we discussed: ? Goals   ? ?   Patient Stated (pt-stated)   ?   Lose weight. ?  ? ?  ?  ?This is a list of the screening recommended for you and due dates:  ?Health Maintenance  ?Topic Date Due  ? Zoster (Shingles) Vaccine (1 of 2) Never done  ? COVID-19 Vaccine (4 - Booster for [place] series) 07/06/2020  ? Flu Shot  03/15/2022  ? Tetanus Vaccine  01/21/2025  ? Pneumonia Vaccine  Completed  ? Hepatitis C Screening: USPSTF Recommendation to screen - Ages 27-79 yo.  Completed  ? HPV Vaccine  Aged Out  ? Colon Cancer Screening  Discontinued  ? ?Advanced directives: Yes ? ?Conditions/risks identified: None ? ?Next appointment: Follow up in one year for your annual wellness visit.  ? ?Preventive Care 21 Years and Older, Male ?Preventive care refers to lifestyle choices and visits with your health care provider that can promote health and wellness. ?What does preventive care include? ?A yearly physical exam. This is also called an annual well check. ?Dental exams once or twice a year. ?Routine eye exams. Ask your health care provider how often you should have your eyes checked. ?Personal lifestyle choices, including: ?Daily care of your teeth and gums. ?Regular physical activity. ?Eating a healthy diet. ?Avoiding tobacco and drug use. ?Limiting alcohol use. ?Practicing safe sex. ?Taking low doses of aspirin every day. ?Taking vitamin and mineral supplements as recommended by your health care provider. ?What happens during an annual well check? ?The services and screenings done by your health care provider during your annual well check will depend on your age, overall health, lifestyle risk factors, and family history of disease. ?Counseling  ?Your health care  provider may ask you questions about your: ?Alcohol use. ?Tobacco use. ?Drug use. ?Emotional well-being. ?Home and relationship well-being. ?Sexual activity. ?Eating habits. ?History of falls. ?Memory and ability to understand (cognition). ?Work and work Statistician. ?Screening  ?You may have the following tests or measurements: ?Height, weight, and BMI. ?Blood pressure. ?Lipid and cholesterol levels. These may be checked every 5 years, or more frequently if you are over 28 years old. ?Skin check. ?Lung cancer screening. You may have this screening every year starting at age 65 if you have a 30-pack-year history of smoking and currently smoke or have quit within the past 15 years. ?Fecal occult blood test (FOBT) of the stool. You may have this test every year starting at age 46. ?Flexible sigmoidoscopy or colonoscopy. You may have a sigmoidoscopy every 5 years or a colonoscopy every 10 years starting at age 16. ?Prostate cancer screening. Recommendations will vary depending on your family history and other risks. ?Hepatitis C blood test. ?Hepatitis B blood test. ?Sexually transmitted disease (STD) testing. ?Diabetes screening. This is done by checking your blood sugar (glucose) after you have not eaten for a while (fasting). You may have this done every 1-3 years. ?Abdominal aortic aneurysm (AAA) screening. You may need this if you are a current or former smoker. ?Osteoporosis. You may be screened starting at age 85 if you are at high risk. ?Talk with your health care provider about your test results, treatment options, and if necessary, the need for more  tests. ?Vaccines  ?Your health care provider may recommend certain vaccines, such as: ?Influenza vaccine. This is recommended every year. ?Tetanus, diphtheria, and acellular pertussis (Tdap, Td) vaccine. You may need a Td booster every 10 years. ?Zoster vaccine. You may need this after age 31. ?Pneumococcal 13-valent conjugate (PCV13) vaccine. One dose is  recommended after age 58. ?Pneumococcal polysaccharide (PPSV23) vaccine. One dose is recommended after age 75. ?Talk to your health care provider about which screenings and vaccines you need and how often you need them. ?This information is not intended to replace advice given to you by your health care provider. Make sure you discuss any questions you have with your health care provider. ?Document Released: 08/28/2015 Document Revised: 04/20/2016 Document Reviewed: 06/02/2015 ?Elsevier Interactive Patient Education ? 2017 [place]. ? ?Fall Prevention in the Home ?Falls can cause injuries. They can happen to people of all ages. There are many things you can do to make your home safe and to help prevent falls. ?What can I do on the outside of my home? ?Regularly fix the edges of walkways and driveways and fix any cracks. ?Remove anything that might make you trip as you walk through a door, such as a raised step or threshold. ?Trim any bushes or trees on the path to your home. ?Use bright outdoor lighting. ?Clear any walking paths of anything that might make someone trip, such as rocks or tools. ?Regularly check to see if handrails are loose or broken. Make sure that both sides of any steps have handrails. ?Any raised decks and porches should have guardrails on the edges. ?Have any leaves, snow, or ice cleared regularly. ?Use sand or salt on walking paths during winter. ?Clean up any spills in your garage right away. This includes oil or grease spills. ?What can I do in the bathroom? ?Use night lights. ?Install grab bars by the toilet and in the tub and shower. Do not use towel bars as grab bars. ?Use non-skid mats or decals in the tub or shower. ?If you need to sit down in the shower, use a plastic, non-slip stool. ?Keep the floor dry. Clean up any water that spills on the floor as soon as it happens. ?Remove soap buildup in the tub or shower regularly. ?Attach bath mats securely with double-sided non-slip rug  tape. ?Do not have throw rugs and other things on the floor that can make you trip. ?What can I do in the bedroom? ?Use night lights. ?Make sure that you have a light by your bed that is easy to reach. ?Do not use any sheets or blankets that are too big for your bed. They should not hang down onto the floor. ?Have a firm chair that has side arms. You can use this for support while you get dressed. ?Do not have throw rugs and other things on the floor that can make you trip. ?What can I do in the kitchen? ?Clean up any spills right away. ?Avoid walking on wet floors. ?Keep items that you use a lot in easy-to-reach places. ?If you need to reach something above you, use a strong step stool that has a grab bar. ?Keep electrical cords out of the way. ?Do not use floor polish or wax that makes floors slippery. If you must use wax, use non-skid floor wax. ?Do not have throw rugs and other things on the floor that can make you trip. ?What can I do with my stairs? ?Do not leave any items on the stairs. ?Make sure  that there are handrails on both sides of the stairs and use them. Fix handrails that are broken or loose. Make sure that handrails are as long as the stairways. ?Check any carpeting to make sure that it is firmly attached to the stairs. Fix any carpet that is loose or worn. ?Avoid having throw rugs at the top or bottom of the stairs. If you do have throw rugs, attach them to the floor with carpet tape. ?Make sure that you have a light switch at the top of the stairs and the bottom of the stairs. If you do not have them, ask someone to add them for you. ?What else can I do to help prevent falls? ?Wear shoes that: ?Do not have high heels. ?Have rubber bottoms. ?Are comfortable and fit you well. ?Are closed at the toe. Do not wear sandals. ?If you use a stepladder: ?Make sure that it is fully opened. Do not climb a closed stepladder. ?Make sure that both sides of the stepladder are locked into place. ?Ask someone to  hold it for you, if possible. ?Clearly mark and make sure that you can see: ?Any grab bars or handrails. ?First and last steps. ?Where the edge of each step is. ?Use tools that help you move around (mobility aids) if

## 2021-11-30 NOTE — Progress Notes (Signed)
Carelink Summary Report / Loop Recorder 

## 2021-12-07 ENCOUNTER — Ambulatory Visit (INDEPENDENT_AMBULATORY_CARE_PROVIDER_SITE_OTHER): Payer: Medicare Other | Admitting: Internal Medicine

## 2021-12-07 ENCOUNTER — Encounter: Payer: Self-pay | Admitting: Internal Medicine

## 2021-12-07 VITALS — BP 126/74 | HR 56 | Ht 70.5 in | Wt 220.0 lb

## 2021-12-07 DIAGNOSIS — R0609 Other forms of dyspnea: Secondary | ICD-10-CM | POA: Diagnosis not present

## 2021-12-07 DIAGNOSIS — I2510 Atherosclerotic heart disease of native coronary artery without angina pectoris: Secondary | ICD-10-CM

## 2021-12-07 DIAGNOSIS — R55 Syncope and collapse: Secondary | ICD-10-CM | POA: Diagnosis not present

## 2021-12-07 NOTE — Patient Instructions (Addendum)
Medication Instructions:  ?Your physician recommends that you continue on your current medications as directed. Please refer to the Current Medication list given to you today. ? ?Labwork: ?None ordered. ? ?Testing/Procedures: ?You will have a lexiscan myoview. ? ?Follow-Up: ?Your physician wants you to follow-up based on results of your stress test. ? ? ?Any Other Special Instructions Will Be Listed Below (If Applicable). ? ?If you need a refill on your cardiac medications before your next appointment, please call your pharmacy.  ? ?Important Information About Sugar ? ? ? ? ? ? ? ?

## 2021-12-07 NOTE — Progress Notes (Signed)
? ? ? ? ?HPI ?Mr .Onley returns today for followup. He is a pleasant 76 yo man with a h/o syncope and atrial fib. He describes an episode of near syncope when he was using the bathroom where his wife found him diaphoretic and layed him down. He has not had symptoms with his atrial fib. He admits to being sedentary. In the past he has had several falls and his wife is concerned about falls on systemic anti-coagulation. ? ?No Known Allergies ? ? ?Current Outpatient Medications  ?Medication Sig Dispense Refill  ? acetaminophen (TYLENOL) 500 MG tablet Take 1,000 mg by mouth every 8 (eight) hours as needed for mild pain or headache.    ? amoxicillin (AMOXIL) 500 MG tablet Take 4 tabs (2G) by mouth 1 hour before dental procedure    ? Colchicine (MITIGARE) 0.6 MG CAPS TAKE 1 CAPSULE BY MOUTH DAILY 30 capsule 2  ? diclofenac Sodium (VOLTAREN) 1 % GEL Apply 2 g topically every 30 (thirty) days.    ? metoprolol succinate (TOPROL-XL) 25 MG 24 hr tablet Take 1 tablet (25 mg total) by mouth daily. 90 tablet 3  ? rivaroxaban (XARELTO) 20 MG TABS tablet TAKE 1 TABLET EVERY DAY WITH SUPPER 90 tablet 3  ? rosuvastatin (CRESTOR) 10 MG tablet TAKE 1 TABLET(10 MG) BY MOUTH DAILY 30 tablet 11  ? tadalafil (CIALIS) 5 MG tablet Take 5 mg by mouth daily as needed for erectile dysfunction.  10  ? ?No current facility-administered medications for this visit.  ? ? ? ?Past Medical History:  ?Diagnosis Date  ? Arthritis   ? "knees, hips" (10/10/2014)  ? Basal cell carcinoma   ? BPH (benign prostatic hyperplasia)   ? Bradycardia vasovagal response 10 yrs ago  ? asymptomatic  ? Diastolic dysfunction   ? Per echo in 2008  ? DVT (deep venous thrombosis) ([place]) 1979  ? RLE  ? Erectile dysfunction   ? History of gout   ? History of nuclear stress test   ? ETT-Myoview 3/17: Hypertensive blood pressure response to exercise, normal perfusion, EF 60%, low risk study  ? Hypercholesterolemia   ? ? statin intolerant. May tolerate Lipitor  ? Normal nuclear  stress test May 2011  ? Obesity   ? Pulmonary embolism ([place]) 1979  ? Syncope and collapse 8-10 yrs ago  ? ? ?ROS: ? ? All systems reviewed and negative except as noted in the HPI. ? ? ?Past Surgical History:  ?Procedure Laterality Date  ? CARDIOVASCULAR STRESS TEST  May 2011  ? EF 73%; No ischemia  ? COLONOSCOPY  2013  ? IVC FILTER INSERTION N/A 10/06/2020  ? Procedure: IVC FILTER INSERTION;  Surgeon: Serafina Mitchell, MD;  Location: [facility];  Service: Cardiovascular;  Laterality: N/A;  w/ IVC Venogram  ? IVC FILTER REMOVAL N/A 01/05/2021  ? Procedure: IVC FILTER REMOVAL;  Surgeon: Serafina Mitchell, MD;  Location: [place];  Service: Cardiovascular;  Laterality: N/A;  ? JOINT REPLACEMENT    ? KNEE ARTHROSCOPY Right 10/03/2012  ? Procedure: RIGHT KNEE ARTHROSCOPY WITH DEBRIDEMENT;  Surgeon: [name], MD;  Location: [facility];  Service: Orthopedics;  Laterality: Right;  WITH DEBRIDEMENT  ? MOHS SURGERY  6 yrs   ? nose  ? TOTAL HIP ARTHROPLASTY Right 10/06/2014  ? ? ? ?Family History  ?Adopted: Yes  ?Family history unknown: Yes  ? ? ? ?Social History  ? ?Socioeconomic History  ? Marital status: Divorced  ?  Spouse  name: Not on file  ? Number of children: Not on file  ? Years of education: Not on file  ? Highest education level: Not on file  ?Occupational History  ? Not on file  ?Tobacco Use  ? Smoking status: Former  ?  Packs/day: 1.00  ?  Years: 10.00  ?  Pack years: 10.00  ?  Types: Cigarettes  ?  Quit date: 08/15/1977  ?  Years since quitting: 44.3  ? Smokeless tobacco: Never  ?Vaping Use  ? Vaping Use: Never used  ?Substance and Sexual Activity  ? Alcohol use: Yes  ?  Alcohol/week: 7.0 standard drinks  ?  Types: 7 Shots of liquor per week  ? Drug use: No  ? Sexual activity: Yes  ?Other Topics Concern  ? Not on file  ?Social History Narrative  ? Not on file  ? ?Social Determinants of Health  ? ?Financial Resource Strain: Low Risk   ? Difficulty of Paying Living Expenses: Not hard at all  ?Food  Insecurity: No Food Insecurity  ? Worried About Charity fundraiser in the Last Year: Never true  ? Ran Out of Food in the Last Year: Never true  ?Transportation Needs: No Transportation Needs  ? Lack of Transportation (Medical): No  ? Lack of Transportation (Non-Medical): No  ?Physical Activity: Sufficiently Active  ? Days of Exercise per Week: 5 days  ? Minutes of Exercise per Session: 60 min  ?Stress: No Stress Concern Present  ? Feeling of Stress : Not at all  ?Social Connections: Moderately Isolated  ? Frequency of Communication with Friends and Family: More than three times a week  ? Frequency of Social Gatherings with Friends and Family: More than three times a week  ? Attends Religious Services: Never  ? Active Member of Clubs or Organizations: No  ? Attends Archivist Meetings: Never  ? Marital Status: Married  ?Intimate Partner Violence: Not At Risk  ? Fear of Current or Ex-Partner: No  ? Emotionally Abused: No  ? Physically Abused: No  ? Sexually Abused: No  ? ? ? ?BP 126/74   Pulse (!) 56   Ht 5' 10.5" (1.791 m)   Wt 220 lb (99.8 kg)   SpO2 97%   BMI 31.12 kg/m?  ? ?Physical Exam: ? ?obese appearing NAD ?HEENT: Unremarkable ?Neck:  No JVD, no thyromegally ?Lymphatics:  No adenopathy ?Back:  No CVA tenderness ?Lungs:  Clear with no wheezes ?HEART:  Regular rate rhythm, no murmurs, no rubs, no clicks ?Abd:  soft, positive bowel sounds, no organomegally, no rebound, no guarding ?Ext:  2 plus pulses, no edema, no cyanosis, no clubbing ?Skin:  No rashes no nodules ?Neuro:  CN II through XII intact, motor grossly intact ? ?DEVICE  ?Normal device function.  See PaceArt for details.  ? ?Assess/Plan:  ?Atrial fib - he is mostly maintaining NSR. Continue Xarelto for now. Because of his potential for falls, I will refer him to consider Watchman insertion as he is not a great long term candidate for systemi anti-coagulation. 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 I  explained the importance of lying down when he feels a spell about to occur. He always has some warning.  ?Dyspnea - unclear of the etiology but he notes sob with exertion. We will obtain a lexiscan myoview. His wife notes that his dyspnea with exertion is worse.  ? ? ?Carleene Overlie Ryker Pherigo,MD ?

## 2021-12-13 ENCOUNTER — Ambulatory Visit (HOSPITAL_COMMUNITY): Payer: Medicare Other | Attending: Cardiology

## 2021-12-13 DIAGNOSIS — R0609 Other forms of dyspnea: Secondary | ICD-10-CM | POA: Diagnosis not present

## 2021-12-13 LAB — MYOCARDIAL PERFUSION IMAGING
Base ST Depression (mm): 0 mm
LV dias vol: 122 mL (ref 62–150)
LV sys vol: 49 mL
Nuc Stress EF: 60 %
Peak HR: 75 {beats}/min
Rest HR: 50 {beats}/min
Rest Nuclear Isotope Dose: 10.8 mCi
SDS: 1
SRS: 2
SSS: 3
ST Depression (mm): 0 mm
Stress Nuclear Isotope Dose: 31.1 mCi
TID: 0.98

## 2021-12-13 MED ORDER — TECHNETIUM TC 99M TETROFOSMIN IV KIT
10.8000 | PACK | Freq: Once | INTRAVENOUS | Status: AC | PRN
  Administered 2021-12-13: 10.8 via INTRAVENOUS
  Filled 2021-12-13: qty 11

## 2021-12-13 MED ORDER — REGADENOSON 0.4 MG/5ML IV SOLN
0.4000 mg | Freq: Once | INTRAVENOUS | Status: AC
  Administered 2021-12-13: 0.4 mg via INTRAVENOUS

## 2021-12-13 MED ORDER — TECHNETIUM TC 99M TETROFOSMIN IV KIT
31.1000 | PACK | Freq: Once | INTRAVENOUS | Status: AC | PRN
  Administered 2021-12-13: 31.1 via INTRAVENOUS
  Filled 2021-12-13: qty 32

## 2021-12-14 ENCOUNTER — Telehealth: Payer: Self-pay | Admitting: Emergency Medicine

## 2021-12-15 NOTE — Telephone Encounter (Signed)
Left message for patient's wife to call back.  

## 2021-12-16 LAB — CUP PACEART REMOTE DEVICE CHECK
Date Time Interrogation Session: 20230503230806
Implantable Pulse Generator Implant Date: 20220607

## 2021-12-20 ENCOUNTER — Ambulatory Visit (INDEPENDENT_AMBULATORY_CARE_PROVIDER_SITE_OTHER): Payer: Medicare Other

## 2021-12-20 DIAGNOSIS — R55 Syncope and collapse: Secondary | ICD-10-CM | POA: Diagnosis not present

## 2021-12-27 ENCOUNTER — Other Ambulatory Visit: Payer: Self-pay | Admitting: Gastroenterology

## 2021-12-27 ENCOUNTER — Other Ambulatory Visit: Payer: Self-pay | Admitting: Physician Assistant

## 2021-12-27 DIAGNOSIS — R109 Unspecified abdominal pain: Secondary | ICD-10-CM

## 2021-12-27 DIAGNOSIS — K219 Gastro-esophageal reflux disease without esophagitis: Secondary | ICD-10-CM | POA: Diagnosis not present

## 2021-12-27 DIAGNOSIS — Z1211 Encounter for screening for malignant neoplasm of colon: Secondary | ICD-10-CM | POA: Diagnosis not present

## 2021-12-27 DIAGNOSIS — I480 Paroxysmal atrial fibrillation: Secondary | ICD-10-CM | POA: Diagnosis not present

## 2021-12-27 LAB — HM COLONOSCOPY

## 2021-12-28 ENCOUNTER — Ambulatory Visit
Admission: RE | Admit: 2021-12-28 | Discharge: 2021-12-28 | Disposition: A | Discharge status: Home / Self Care | Payer: Medicare Other | Source: Ambulatory Visit | Attending: Family Medicine | Admitting: Family Medicine

## 2021-12-28 DIAGNOSIS — R911 Solitary pulmonary nodule: Secondary | ICD-10-CM | POA: Diagnosis not present

## 2021-12-28 DIAGNOSIS — R918 Other nonspecific abnormal finding of lung field: Secondary | ICD-10-CM | POA: Diagnosis not present

## 2021-12-30 NOTE — Telephone Encounter (Signed)
Called and spoke with pt's spouse Arville Go who stated that pt had CT chest 5/16 and also was able to see that the abdomen was also captured during that CT.  While speaking with Arville Go, stated to her that pt was overdue for an appt so have scheduled an appt for pt with RB to also have CT results discussed during that OV. Nothing further needed.

## 2021-12-31 ENCOUNTER — Encounter: Payer: Self-pay | Admitting: Emergency Medicine

## 2021-12-31 ENCOUNTER — Ambulatory Visit (INDEPENDENT_AMBULATORY_CARE_PROVIDER_SITE_OTHER): Payer: Medicare Other | Admitting: Emergency Medicine

## 2021-12-31 ENCOUNTER — Other Ambulatory Visit: Payer: Medicare Other

## 2021-12-31 DIAGNOSIS — R918 Other nonspecific abnormal finding of lung field: Secondary | ICD-10-CM | POA: Diagnosis not present

## 2021-12-31 DIAGNOSIS — I2510 Atherosclerotic heart disease of native coronary artery without angina pectoris: Secondary | ICD-10-CM

## 2021-12-31 DIAGNOSIS — R0609 Other forms of dyspnea: Secondary | ICD-10-CM | POA: Diagnosis not present

## 2021-12-31 DIAGNOSIS — R0600 Dyspnea, unspecified: Secondary | ICD-10-CM | POA: Insufficient documentation

## 2021-12-31 NOTE — Assessment & Plan Note (Addendum)
He notes shortness of breath with exertion, questions whether this may be some deconditioning because he has not been going to the [facility] or exercising.  He wants to get back to this.  Minimal tobacco history.  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  Encouraged him to follow-up with [place] if his dyspnea does not improve when he upped his exercise.  May benefit from PFT, other work-up, possibly for [facility]

## 2021-12-31 NOTE — Assessment & Plan Note (Signed)
Reviewed his CT scan from 12/28/2021.  His pulmonary nodules have resolved.  No indication for repeat scanning.  Good news.  Discussed with him in detail.

## 2021-12-31 NOTE — Patient Instructions (Signed)
We reviewed your CT scan of the chest today.  Your pulmonary nodules have resolved.  This is good news.  We do not need to do any more serial imaging. We talked today about your shortness of breath.  Agree with getting back to your exercise routine, building up your stamina.  If you continue to have short windedness then please follow-up with Dr. Lamonte Sakai so we can discuss any appropriate evaluation and testing.

## 2021-12-31 NOTE — Progress Notes (Signed)
Subjective:    Patient ID: Joe Davis, male    DOB: 1945/08/27, 76 y.o.   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  He has had syncope felt to be vasovagal. He underwent cardiac CT 01/03/2020 which I have reviewed, shows pulmonary nodular disease that includes a 4 mm peripheral right lower lobe nodule, 6 mm posterior medial right lower lobe nodule (new from 2013), subtle 4 mm left upper lobe nodule.  Underwent subsequent Myoview that was reassuring.    He does have exertional SOB - hasn't been able to swim at the [facility] since COVID, feels that he has lost some conditioning. No cough, no CP. He has gained some wt - working on losing it.    ROV 12/31/21 --76 year old gentleman gentleman with a former minimal tobacco history, history of VTE, hypertension with diastolic dysfunction, OSA not on CPAP.  I saw him in 2021 for pulmonary nodules noted on CT scan of the chest.  We decided to follow with serial imaging.  A CT chest from 06/22/2021 showed some scar in the right middle lobe, new subpleural nodular consolidation in the anterior lateral right lower lobe.  The nodular density previously been a fied on CT was resolved.  He returns now after a repeat scan done 12/28/2021 as below.  He has exertional SOB, notes that he has not been as active, feels that he is deconditioned.   CT chest 12/28/2021 reviewed by me shows subpleural right middle lobe scar full resolution of subpleural anterior lateral right lower lobe nodular lesion.  There are no suspicious pulmonary nodules noted   Review of Systems As per HPI  Past Medical History:  Diagnosis Date   Arthritis    "knees, hips" (10/10/2014)   Basal cell carcinoma    BPH (benign prostatic hyperplasia)    Bradycardia vasovagal response 10 yrs ago   asymptomatic   Diastolic dysfunction    Per echo in 2008    DVT (deep venous thrombosis) ([place]) 1979   RLE   Erectile dysfunction    History of gout    History of nuclear stress test    ETT-Myoview 3/17: Hypertensive blood pressure response to exercise, normal perfusion, EF 60%, low risk study   Hypercholesterolemia    ? statin intolerant. May tolerate Lipitor   Normal nuclear stress test May 2011   Obesity    Pulmonary embolism ([place]) 1979   Syncope and collapse 8-10 yrs ago     Family History  Adopted: Yes  Family history unknown: Yes     Social History   Socioeconomic History   Marital status: Divorced    Spouse name: Not on file   Number of children: Not on file   Years of education: Not on file   Highest education level: Not on file  Occupational History   Not on file  Tobacco Use   Smoking status: Former    Packs/day: 1.00    Years: 10.00    Pack years: 10.00    Types: Cigarettes    Quit date: 08/15/1977    Years since quitting: 44.4   Smokeless tobacco: Never  Vaping Use   Vaping Use: Never used  Substance and Sexual Activity   Alcohol use: Yes    Alcohol/week: 7.0 standard drinks    Types: 7 Shots of liquor per week   Drug use: No  Sexual activity: Yes  Other Topics Concern   Not on file  Social History Narrative   Not on file   Social Determinants of Health   Financial Resource Strain: Low Risk    Difficulty of Paying Living Expenses: Not hard at all  Food Insecurity: No Food Insecurity   Worried About [place] in the Last Year: Never true   [place] in the Last Year: Never true  Transportation Needs: No Transportation Needs   Lack of Transportation (Medical): No   Lack of Transportation (Non-Medical): No  Physical Activity: Sufficiently Active   Days of Exercise per Week: 5 days   Minutes of Exercise per Session: 60 min  Stress: No Stress Concern Present   Feeling of Stress : Not at all  Social Connections: Moderately Isolated   Frequency of Communication with Friends and Family: More  than three times a week   Frequency of Social Gatherings with Friends and Family: More than three times a week   Attends Religious Services: Never   Marine scientist or Organizations: No   Attends Music therapist: Never   Marital Status: Married  Human resources officer Violence: Not At Risk   Fear of Current or Ex-Partner: No   Emotionally Abused: No   Physically Abused: No   Sexually Abused: No    [place] native 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  No known asbestos exposure Was in the [place], [place], no agent orange   No Known Allergies   Outpatient Medications Prior to Visit  Medication Sig Dispense Refill   acetaminophen (TYLENOL) 500 MG tablet Take 1,000 mg by mouth every 8 (eight) hours as needed for mild pain or headache.     amoxicillin (AMOXIL) 500 MG tablet Take 4 tabs (2G) by mouth 1 hour before dental procedure     Colchicine (MITIGARE) 0.6 MG CAPS TAKE 1 CAPSULE BY MOUTH DAILY 30 capsule 2   diclofenac Sodium (VOLTAREN) 1 % GEL Apply 2 g topically every 30 (thirty) days.     metoprolol succinate (TOPROL-XL) 25 MG 24 hr tablet Take 1 tablet (25 mg total) by mouth daily. 90 tablet 3   rivaroxaban (XARELTO) 20 MG TABS tablet TAKE 1 TABLET EVERY DAY WITH SUPPER 90 tablet 3   rosuvastatin (CRESTOR) 10 MG tablet TAKE 1 TABLET(10 MG) BY MOUTH DAILY 30 tablet 11   tadalafil (CIALIS) 5 MG tablet Take 5 mg by mouth daily as needed for erectile dysfunction.  10   No facility-administered medications prior to visit.        Objective:   Physical Exam Vitals:   12/31/21 0907  BP: 130/78  Pulse: (!) 49  Temp: 98.3 F (36.8 C)  TempSrc: Oral  SpO2: 98%  Weight: 235 lb (106.6 kg)  Height: 5' 10.5" (1.791 m)    Gen: Pleasant, elderly gentleman, in no distress,  normal affect  ENT: No lesions,  mouth clear,  oropharynx clear, no postnasal drip  Neck: No JVD, no stridor  Lungs: No use of  accessory muscles, no crackles or wheezing on normal respiration, no wheeze on forced expiration  Cardiovascular: RRR, heart sounds normal, no murmur or gallops, no peripheral edema  Musculoskeletal: No deformities, no cyanosis or clubbing  Neuro: alert, awake, non focal, a bit slow to respond.   Skin: Warm, no lesions or rash       Assessment & Plan:  Pulmonary nodules/lesions, multiple  Reviewed his CT scan from 12/28/2021.  His pulmonary nodules have resolved.  No indication for repeat scanning.  Good news.  Discussed with him in detail.  Dyspnea He notes shortness of breath with exertion, questions whether this may be some deconditioning because he has not been going to the [facility] or exercising.  He wants to get back to this.  Minimal tobacco history.  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  Encouraged him to follow-up with [place] if his dyspnea does not improve when he upped his exercise.  May benefit from PFT, other work-up, possibly for [facility]  Baltazar Apo, MD, PhD 12/31/2021, 9:29 AM [facility] [phone] or if no answer [phone]

## 2022-01-05 ENCOUNTER — Telehealth: Payer: Self-pay

## 2022-01-07 NOTE — Progress Notes (Signed)
Carelink Summary Report / Loop Recorder 

## 2022-01-11 ENCOUNTER — Encounter: Payer: Self-pay | Admitting: Family Medicine

## 2022-01-11 NOTE — Telephone Encounter (Signed)
Returned call to wife.  She states she will discuss with husband and call back.

## 2022-01-11 NOTE — Telephone Encounter (Signed)
Patient is returning call.  °

## 2022-01-11 NOTE — Telephone Encounter (Signed)
Outreach made to Pt's wife.  Her understanding from last OV was that Dr. Lovena Le recommends a pacemaker for Pt.  Advised wife would discuss with Dr. Lovena Le upon his return.  Additionally, wife will discuss with Pt if he is interested in a referral to consider watchman implant.

## 2022-01-12 ENCOUNTER — Encounter: Payer: Self-pay | Admitting: Internal Medicine

## 2022-01-13 NOTE — Telephone Encounter (Signed)
Attempted to contact Pt at both numbers listed.  Sent mychart message advising if Pt would like a referral to consider Watchman to send a message.  Await further needs.

## 2022-01-20 DIAGNOSIS — M1712 Unilateral primary osteoarthritis, left knee: Secondary | ICD-10-CM | POA: Diagnosis not present

## 2022-01-20 DIAGNOSIS — Z96641 Presence of right artificial hip joint: Secondary | ICD-10-CM | POA: Diagnosis not present

## 2022-01-20 DIAGNOSIS — Z09 Encounter for follow-up examination after completed treatment for conditions other than malignant neoplasm: Secondary | ICD-10-CM | POA: Diagnosis not present

## 2022-01-20 DIAGNOSIS — M1711 Unilateral primary osteoarthritis, right knee: Secondary | ICD-10-CM | POA: Diagnosis not present

## 2022-01-20 DIAGNOSIS — Z96642 Presence of left artificial hip joint: Secondary | ICD-10-CM | POA: Diagnosis not present

## 2022-01-21 ENCOUNTER — Other Ambulatory Visit: Payer: Self-pay | Admitting: Family Medicine

## 2022-01-24 ENCOUNTER — Ambulatory Visit (INDEPENDENT_AMBULATORY_CARE_PROVIDER_SITE_OTHER): Payer: Medicare Other

## 2022-01-24 DIAGNOSIS — R55 Syncope and collapse: Secondary | ICD-10-CM

## 2022-01-26 ENCOUNTER — Telehealth: Payer: Self-pay | Admitting: Family Medicine

## 2022-01-26 DIAGNOSIS — R233 Spontaneous ecchymoses: Secondary | ICD-10-CM

## 2022-01-26 LAB — CUP PACEART REMOTE DEVICE CHECK
Date Time Interrogation Session: 20230605231016
Implantable Pulse Generator Implant Date: 20220607

## 2022-01-26 NOTE — Telephone Encounter (Signed)
Referral placed and pt aware   

## 2022-01-26 NOTE — Telephone Encounter (Signed)
Pt call and stated he want a referral to go to [facility] and want a call back I offer him a appt but didn't want it .

## 2022-01-26 NOTE — Telephone Encounter (Signed)
I spoke with the Joe Davis and he requested dermatology referral due to a spot he has been having on his face thinks he should be seen for this. Joe Davis would like to be referred to Dr. Royanne Foots with [facility].

## 2022-01-28 ENCOUNTER — Telehealth: Payer: Self-pay | Admitting: *Deleted

## 2022-01-28 NOTE — Telephone Encounter (Signed)
error 

## 2022-01-28 NOTE — Telephone Encounter (Signed)
   Pre-operative Risk Assessment    Patient Name: Joe Davis  DOB: 1946/05/04 MRN: [id number]      Request for Surgical Clearance    Procedure:   COLONOSCOPY  Date of Surgery:  Clearance 05/18/22                                 Surgeon:  DR. Paulita Fujita Surgeon's Group or Practice Name:  [facility] Phone number:  [phone] Fax number:  [phone]   Type of Clearance Requested:   - Medical  - Pharmacy:  Hold Rivaroxaban (Xarelto) x 3 DAYS PRIOR   Type of Anesthesia:   PROPOFOL   Additional requests/questions:    [name]   01/28/2022, 1:15 PM

## 2022-01-31 NOTE — Telephone Encounter (Signed)
Patient with diagnosis of afib and DVT on Xarelto for anticoagulation.    Procedure: colonoscopy Date of procedure: 05/18/22  CHA2DS2-VASc Score = 3  This indicates a 3.2% annual risk of stroke. The patient's score is based upon: CHF History: 0 HTN History: 0 Diabetes History: 0 Stroke History: 0 Vascular Disease History: 1 Age Score: 2 Gender Score: 0  DVT noted 03/2020, also with prior hx of DVT and PE in 1979. In 08/2020 VVS recommended IVC filter before anticoag was stopped for major ortho procedure with additional rec to limit time off anticoag as much as possible. IVC was inserted 09/2020 and removed 12/2020.  CrCl 18m/min using adjusted body weight Platelet count 200K  Per office protocol, patient can hold Xarelto for 1 day prior to procedure. He should resume as soon as safely possible after due to hx of recurrent VTEs.

## 2022-01-31 NOTE — Telephone Encounter (Signed)
   Primary Cardiologist: Jenkins Rouge, MD  Chart reviewed as part of pre-operative protocol coverage. Given past medical history and time since last visit, based on ACC/AHA guidelines, Joe Davis would be at acceptable risk for the planned procedure without further cardiovascular testing.   Patient with diagnosis of afib and DVT on Xarelto for anticoagulation.     Procedure: colonoscopy Date of procedure: 05/18/22   CHA2DS2-VASc Score = 3  This indicates a 3.2% annual risk of stroke. The patient's score is based upon: CHF History: 0 HTN History: 0 Diabetes History: 0 Stroke History: 0 Vascular Disease History: 1 Age Score: 2 Gender Score: 0   DVT noted 03/2020, also with prior hx of DVT and PE in 1979. In 08/2020 VVS recommended IVC filter before anticoag was stopped for major ortho procedure with additional rec to limit time off anticoag as much as possible. IVC was inserted 09/2020 and removed 12/2020.   CrCl 29m/min using adjusted body weight Platelet count 200K   Per office protocol, patient can hold Xarelto for 1 day prior to procedure. He should resume as soon as safely possible after due to hx of recurrent VTEs.  I will route this recommendation to the requesting party via Epic fax function and remove from pre-op pool.  Please call with questions.  JJossie Ng Aleiah Mohammed NP-C    01/31/2022, 1:48 PM [facility] [address] Office [phone] ([phone]

## 2022-02-01 ENCOUNTER — Telehealth: Payer: Self-pay | Admitting: Family Medicine

## 2022-02-01 NOTE — Telephone Encounter (Signed)
Pt informed that referral has been placed and to contact us if he has not heard anything in the next week

## 2022-02-01 NOTE — Telephone Encounter (Signed)
Patient is following up on referral to [facility].  He call them and they said they had not received his referral yet.  He is requesting a call back.

## 2022-02-09 NOTE — Progress Notes (Signed)
Carelink Summary Report / Loop Recorder 

## 2022-02-24 LAB — CUP PACEART REMOTE DEVICE CHECK
Date Time Interrogation Session: 20230708230825
Implantable Pulse Generator Implant Date: 20220607

## 2022-02-28 ENCOUNTER — Ambulatory Visit (INDEPENDENT_AMBULATORY_CARE_PROVIDER_SITE_OTHER): Payer: Medicare Other

## 2022-02-28 DIAGNOSIS — R55 Syncope and collapse: Secondary | ICD-10-CM | POA: Diagnosis not present

## 2022-03-22 ENCOUNTER — Other Ambulatory Visit: Payer: Self-pay | Admitting: Family Medicine

## 2022-03-22 DIAGNOSIS — H401332 Pigmentary glaucoma, bilateral, moderate stage: Secondary | ICD-10-CM | POA: Diagnosis not present

## 2022-03-31 NOTE — Progress Notes (Signed)
Carelink Summary Report / Loop Recorder 

## 2022-04-04 ENCOUNTER — Ambulatory Visit (INDEPENDENT_AMBULATORY_CARE_PROVIDER_SITE_OTHER): Payer: Medicare Other

## 2022-04-04 DIAGNOSIS — R55 Syncope and collapse: Secondary | ICD-10-CM

## 2022-04-05 LAB — CUP PACEART REMOTE DEVICE CHECK
Date Time Interrogation Session: 20230820230919
Implantable Pulse Generator Implant Date: 20220607

## 2022-04-14 DIAGNOSIS — D1801 Hemangioma of skin and subcutaneous tissue: Secondary | ICD-10-CM | POA: Diagnosis not present

## 2022-04-14 DIAGNOSIS — L578 Other skin changes due to chronic exposure to nonionizing radiation: Secondary | ICD-10-CM | POA: Diagnosis not present

## 2022-04-14 DIAGNOSIS — L570 Actinic keratosis: Secondary | ICD-10-CM | POA: Diagnosis not present

## 2022-04-14 DIAGNOSIS — D225 Melanocytic nevi of trunk: Secondary | ICD-10-CM | POA: Diagnosis not present

## 2022-04-14 DIAGNOSIS — D2260 Melanocytic nevi of unspecified upper limb, including shoulder: Secondary | ICD-10-CM | POA: Diagnosis not present

## 2022-04-14 DIAGNOSIS — D2270 Melanocytic nevi of unspecified lower limb, including hip: Secondary | ICD-10-CM | POA: Diagnosis not present

## 2022-04-14 DIAGNOSIS — L821 Other seborrheic keratosis: Secondary | ICD-10-CM | POA: Diagnosis not present

## 2022-04-14 DIAGNOSIS — D485 Neoplasm of uncertain behavior of skin: Secondary | ICD-10-CM | POA: Diagnosis not present

## 2022-04-14 DIAGNOSIS — L814 Other melanin hyperpigmentation: Secondary | ICD-10-CM | POA: Diagnosis not present

## 2022-04-14 DIAGNOSIS — Z1283 Encounter for screening for malignant neoplasm of skin: Secondary | ICD-10-CM | POA: Diagnosis not present

## 2022-04-14 DIAGNOSIS — Z85828 Personal history of other malignant neoplasm of skin: Secondary | ICD-10-CM | POA: Diagnosis not present

## 2022-05-02 NOTE — Progress Notes (Signed)
Carelink Summary Report / Loop Recorder 

## 2022-05-09 ENCOUNTER — Ambulatory Visit (INDEPENDENT_AMBULATORY_CARE_PROVIDER_SITE_OTHER): Payer: Medicare Other

## 2022-05-09 DIAGNOSIS — R55 Syncope and collapse: Secondary | ICD-10-CM

## 2022-05-10 LAB — CUP PACEART REMOTE DEVICE CHECK
Date Time Interrogation Session: 20230922230140
Implantable Pulse Generator Implant Date: 20220607

## 2022-05-11 ENCOUNTER — Encounter (HOSPITAL_COMMUNITY): Payer: Self-pay | Admitting: Gastroenterology

## 2022-05-11 NOTE — Progress Notes (Addendum)
Chart sent to [place], [place] for review due to history of DVT, CAD, PE, a fib, tachycardia, pulmonary nodules, ICD

## 2022-05-17 DIAGNOSIS — H0102B Squamous blepharitis left eye, upper and lower eyelids: Secondary | ICD-10-CM | POA: Diagnosis not present

## 2022-05-17 DIAGNOSIS — H00021 Hordeolum internum right upper eyelid: Secondary | ICD-10-CM | POA: Diagnosis not present

## 2022-05-17 NOTE — Anesthesia Preprocedure Evaluation (Signed)
Anesthesia Evaluation  Patient identified by MRN, date of birth, ID band Patient awake    Reviewed: Allergy & Precautions, H&P , NPO status , Patient's Chart, lab work & pertinent test results  Airway Mallampati: III  TM Distance: >3 FB Neck ROM: Full    Dental no notable dental hx. (+) Teeth Intact, Dental Advisory Given   Pulmonary neg pulmonary ROS, former smoker,    Pulmonary exam normal breath sounds clear to auscultation       Cardiovascular Exercise Tolerance: Good + CAD  + dysrhythmias Atrial Fibrillation  Rhythm:Regular Rate:Normal     Neuro/Psych negative neurological ROS  negative psych ROS   GI/Hepatic negative GI ROS, Neg liver ROS,   Endo/Other  negative endocrine ROS  Renal/GU negative Renal ROS  negative genitourinary   Musculoskeletal  (+) Arthritis , Osteoarthritis,    Abdominal   Peds  Hematology negative hematology ROS (+)   Anesthesia Other Findings   Reproductive/Obstetrics negative OB ROS                            Anesthesia Physical Anesthesia Plan  ASA: 3  Anesthesia Plan: MAC   Post-op Pain Management: Minimal or no pain anticipated   Induction: Intravenous  PONV Risk Score and Plan: 1 and Propofol infusion  Airway Management Planned: Natural Airway and Simple Face Mask  Additional Equipment:   Intra-op Plan:   Post-operative Plan:   Informed Consent: I have reviewed the patients History and Physical, chart, labs and discussed the procedure including the risks, benefits and alternatives for the proposed anesthesia with the patient or authorized representative who has indicated his/her understanding and acceptance.     Dental advisory given  Plan Discussed with: CRNA  Anesthesia Plan Comments:        Anesthesia Quick Evaluation

## 2022-05-18 ENCOUNTER — Encounter (HOSPITAL_COMMUNITY)
Admission: RE | Disposition: A | Discharge status: Home / Self Care | Payer: Self-pay | Source: Home / Self Care | Attending: Gastroenterology

## 2022-05-18 ENCOUNTER — Ambulatory Visit (HOSPITAL_COMMUNITY)
Admission: RE | Admit: 2022-05-18 | Discharge: 2022-05-18 | Disposition: A | Discharge status: Home / Self Care | Payer: Medicare Other | Attending: Gastroenterology | Admitting: Gastroenterology

## 2022-05-18 ENCOUNTER — Ambulatory Visit (HOSPITAL_COMMUNITY): Payer: Medicare Other | Admitting: Physician Assistant

## 2022-05-18 ENCOUNTER — Ambulatory Visit (HOSPITAL_BASED_OUTPATIENT_CLINIC_OR_DEPARTMENT_OTHER): Payer: Medicare Other | Admitting: Physician Assistant

## 2022-05-18 DIAGNOSIS — K648 Other hemorrhoids: Secondary | ICD-10-CM | POA: Insufficient documentation

## 2022-05-18 DIAGNOSIS — K6289 Other specified diseases of anus and rectum: Secondary | ICD-10-CM

## 2022-05-18 DIAGNOSIS — I10 Essential (primary) hypertension: Secondary | ICD-10-CM | POA: Diagnosis not present

## 2022-05-18 DIAGNOSIS — K5730 Diverticulosis of large intestine without perforation or abscess without bleeding: Secondary | ICD-10-CM | POA: Insufficient documentation

## 2022-05-18 DIAGNOSIS — Z1211 Encounter for screening for malignant neoplasm of colon: Secondary | ICD-10-CM | POA: Insufficient documentation

## 2022-05-18 DIAGNOSIS — K635 Polyp of colon: Secondary | ICD-10-CM

## 2022-05-18 DIAGNOSIS — Z7901 Long term (current) use of anticoagulants: Secondary | ICD-10-CM | POA: Diagnosis not present

## 2022-05-18 DIAGNOSIS — D122 Benign neoplasm of ascending colon: Secondary | ICD-10-CM | POA: Insufficient documentation

## 2022-05-18 DIAGNOSIS — D120 Benign neoplasm of cecum: Secondary | ICD-10-CM | POA: Diagnosis not present

## 2022-05-18 DIAGNOSIS — D124 Benign neoplasm of descending colon: Secondary | ICD-10-CM | POA: Insufficient documentation

## 2022-05-18 DIAGNOSIS — M1990 Unspecified osteoarthritis, unspecified site: Secondary | ICD-10-CM | POA: Diagnosis not present

## 2022-05-18 DIAGNOSIS — I4891 Unspecified atrial fibrillation: Secondary | ICD-10-CM | POA: Insufficient documentation

## 2022-05-18 DIAGNOSIS — I2510 Atherosclerotic heart disease of native coronary artery without angina pectoris: Secondary | ICD-10-CM | POA: Diagnosis not present

## 2022-05-18 DIAGNOSIS — Z87891 Personal history of nicotine dependence: Secondary | ICD-10-CM | POA: Diagnosis not present

## 2022-05-18 DIAGNOSIS — D126 Benign neoplasm of colon, unspecified: Secondary | ICD-10-CM | POA: Diagnosis not present

## 2022-05-18 DIAGNOSIS — D123 Benign neoplasm of transverse colon: Secondary | ICD-10-CM | POA: Diagnosis not present

## 2022-05-18 HISTORY — DX: Other complications of anesthesia, initial encounter: T88.59XA

## 2022-05-18 HISTORY — PX: POLYPECTOMY: SHX5525

## 2022-05-18 HISTORY — PX: COLONOSCOPY WITH PROPOFOL: SHX5780

## 2022-05-18 HISTORY — DX: Atherosclerotic heart disease of native coronary artery without angina pectoris: I25.10

## 2022-05-18 SURGERY — COLONOSCOPY WITH PROPOFOL
Anesthesia: Monitor Anesthesia Care

## 2022-05-18 MED ORDER — ONDANSETRON HCL 4 MG/2ML IJ SOLN
INTRAMUSCULAR | Status: DC | PRN
  Administered 2022-05-18: 4 mg via INTRAVENOUS

## 2022-05-18 MED ORDER — LACTATED RINGERS IV SOLN: INTRAVENOUS | Status: DC | PRN

## 2022-05-18 MED ORDER — RIVAROXABAN 20 MG PO TABS: ORAL_TABLET | ORAL | 3 refills | Status: DC

## 2022-05-18 MED ORDER — PROPOFOL 1000 MG/100ML IV EMUL
INTRAVENOUS | Status: AC
  Filled 2022-05-18: qty 100

## 2022-05-18 MED ORDER — PHENYLEPHRINE 80 MCG/ML (10ML) SYRINGE FOR IV PUSH (FOR BLOOD PRESSURE SUPPORT)
PREFILLED_SYRINGE | INTRAVENOUS | Status: DC | PRN
  Administered 2022-05-18: 80 ug via INTRAVENOUS

## 2022-05-18 MED ORDER — GLYCOPYRROLATE PF 0.2 MG/ML IJ SOSY
PREFILLED_SYRINGE | INTRAMUSCULAR | Status: DC | PRN
  Administered 2022-05-18: .2 mg via INTRAVENOUS

## 2022-05-18 MED ORDER — PROPOFOL 500 MG/50ML IV EMUL
INTRAVENOUS | Status: DC | PRN
  Administered 2022-05-18: 125 ug/kg/min via INTRAVENOUS

## 2022-05-18 MED ORDER — PROPOFOL 500 MG/50ML IV EMUL
INTRAVENOUS | Status: AC
  Filled 2022-05-18: qty 50

## 2022-05-18 MED ORDER — LIDOCAINE 2% (20 MG/ML) 5 ML SYRINGE
INTRAMUSCULAR | Status: DC | PRN
  Administered 2022-05-18: 40 mg via INTRAVENOUS

## 2022-05-18 MED ORDER — PROPOFOL 10 MG/ML IV BOLUS
INTRAVENOUS | Status: DC | PRN
  Administered 2022-05-18: 20 mg via INTRAVENOUS

## 2022-05-18 MED ORDER — SODIUM CHLORIDE 0.9 % IV SOLN: INTRAVENOUS | Status: DC

## 2022-05-18 SURGICAL SUPPLY — 22 items

## 2022-05-18 NOTE — Anesthesia Postprocedure Evaluation (Signed)
Anesthesia Post Note  Patient: Joe Davis  Procedure(s) Performed: COLONOSCOPY WITH PROPOFOL POLYPECTOMY     Patient location during evaluation: Endoscopy Anesthesia Type: MAC Level of consciousness: awake and alert Pain management: pain level controlled Vital Signs Assessment: post-procedure vital signs reviewed and stable Respiratory status: spontaneous breathing, nonlabored ventilation and respiratory function stable Cardiovascular status: stable and blood pressure returned to baseline Postop Assessment: no apparent nausea or vomiting Anesthetic complications: no   No notable events documented.  Last Vitals:  Vitals:   05/18/22 0922 05/18/22 0930  BP:  (!) 84/58  Pulse:  62  Resp:  15  Temp: (!) 36.1 C   SpO2:  97%    Last Pain:  Vitals:   05/18/22 0940  TempSrc:   PainSc: 0-No pain                 Natashia Roseman,W. EDMOND

## 2022-05-18 NOTE — Anesthesia Procedure Notes (Signed)
Procedure Name: MAC Date/Time: 05/18/2022 8:44 AM  Performed by: Maxwell Caul, CRNAPre-anesthesia Checklist: Patient identified, Emergency Drugs available, Suction available and Patient being monitored Oxygen Delivery Method: Simple face mask

## 2022-05-18 NOTE — Op Note (Signed)
[facility] Patient Name: Joe Davis Procedure Date: 05/18/2022 MRN: [id number] Attending MD: Arta Silence , MD Date of Birth: 1946-05-27 CSN: [id number] Age: 76 Admit Type: Outpatient Procedure:                Colonoscopy Indications:              Screening for colorectal malignant neoplasm, Last                            colonoscopy: 2012 Providers:                Arta Silence, MD, Jeanella Cara, RN,                            Terique Kawabata Dalton, Technician Referring MD:              Medicines:                Monitored Anesthesia Care Complications:            No immediate complications. Estimated Blood Loss:     Estimated blood loss: none. Procedure:                Pre-Anesthesia Assessment:                           - Prior to the procedure, a History and Physical                            was performed, and patient medications and                            allergies were reviewed. The patient's tolerance of                            previous anesthesia was also reviewed. The risks                            and benefits of the procedure and the sedation                            options and risks were discussed with the patient.                            All questions were answered, and informed consent                            was obtained. Prior Anticoagulants: The patient has                            taken Xarelto (rivaroxaban), last dose was 2 days                            prior to procedure. ASA Grade Assessment: III - A                            patient with  severe systemic disease. After                            reviewing the risks and benefits, the patient was                            deemed in satisfactory condition to undergo the                            procedure.                           After obtaining informed consent, the colonoscope                            was passed under direct vision. Throughout the                             procedure, the patient's blood pressure, pulse, and                            oxygen saturations were monitored continuously. The                            PCF-HQ190L ([id number]) Olympus colonoscope was                            introduced through the anus and advanced to the the                            terminal ileum, with identification of the                            appendiceal orifice and IC valve. The colonoscopy                            was performed without difficulty. The patient                            tolerated the procedure well. The quality of the                            bowel preparation was good. Scope In: 8:55:09 AM Scope Out: 9:17:20 AM Scope Withdrawal Time: 0 hours 19 minutes 54 seconds  Total Procedure Duration: 0 hours 22 minutes 11 seconds  Findings:      Hemorrhoids were found on perianal exam.      Internal hemorrhoids were found during retroflexion. The hemorrhoids       were moderate.      Anal papilla(e) were hypertrophied.      A few medium-mouthed diverticula were found in the sigmoid colon,       descending colon and transverse colon.      Two sessile polyps were found in the transverse colon and ileocecal       valve. The polyps were 2 to 3 mm in size. These polyps were  removed with       a cold biopsy forceps. Resection and retrieval were complete.      A few sessile polyps were found in the transverse colon and ascending       colon. The polyps were 5 to 8 mm in size. These polyps were removed with       a hot snare. Resection and retrieval were complete.      A 7 mm polyp was found in the descending colon. The polyp was sessile.       The polyp was removed with a hot snare. Resection and retrieval were       complete.      Colon otherwise normal; no other polyps, masses, vascular ectasias, or       inflammatory changes were seen. Impression:               - Hemorrhoids found on perianal exam.                           -  Internal hemorrhoids.                           - Anal papilla(e) were hypertrophied.                           - Diverticulosis in the sigmoid colon, in the                            descending colon and in the transverse colon.                           - Two 2 to 3 mm polyps in the transverse colon and                            at the ileocecal valve, removed with a cold biopsy                            forceps. Resected and retrieved.                           - A few 5 to 8 mm polyps in the transverse colon                            and in the ascending colon, removed with a hot                            snare. Resected and retrieved.                           - One 7 mm polyp in the descending colon, removed                            with a hot snare. Resected and retrieved.                           - The examination was otherwise normal.  Moderate Sedation:      None Recommendation:           - Patient has a contact number available for                            emergencies. The signs and symptoms of potential                            delayed complications were discussed with the                            patient. Return to normal activities tomorrow.                            Written discharge instructions were provided to the                            patient.                           - Discharge patient to home (via wheelchair).                           - Resume previous diet today.                           - Continue present medications.                           - Resume Xarelto (rivaroxaban) at prior dose in 2                            days.                           - Await pathology results.                           - Repeat colonoscopy date to be determined after                            pending pathology results are reviewed for                            surveillance based on pathology results.                           - Return to GI office PRN.                            - Return to referring physician as previously                            scheduled. Procedure Code(s):        --- Professional ---  45385, Colonoscopy, flexible; with removal of                            tumor(s), polyp(s), or other lesion(s) by snare                            technique                           45380, 55, Colonoscopy, flexible; with biopsy,                            single or multiple Diagnosis Code(s):        --- Professional ---                           K63.5, Polyp of colon                           Z12.11, Encounter for screening for malignant                            neoplasm of colon                           K64.8, Other hemorrhoids                           K62.89, Other specified diseases of anus and rectum                           K57.30, Diverticulosis of large intestine without                            perforation or abscess without bleeding CPT copyright 2019 American Medical Association. All rights reserved. The codes documented in this report are preliminary and upon coder review may  be revised to meet current compliance requirements. Arta Silence, MD 05/18/2022 9:37:01 AM This report has been signed electronically. Number of Addenda: 0

## 2022-05-18 NOTE — H&P (Signed)
[facility] H/P Note  Chief Complaint: colon cancer screening  HPI: Joe Davis is an 76 y.o. male.  Here for colon cancer screening.  History DVT with filter (since removed); on rivaroxaban, last dose two days ago.  Last colonoscopy 2012 in [place] (normal per report; no records).  He has unknown family history (adopted).  No abdominal pain, change in bowel habits, blood in stool, unintentional weight loss.  Past Medical History:  Diagnosis Date   Arthritis    "knees, hips" (10/10/2014)   Basal cell carcinoma    BPH (benign prostatic hyperplasia)    Bradycardia vasovagal response 10 yrs ago   asymptomatic   Complication of anesthesia    bradycardia with spinal   Coronary artery disease    Diastolic dysfunction    Per echo in 2008   DVT (deep venous thrombosis) ([place]) 08/15/1977   RLE   Erectile dysfunction    History of gout    History of nuclear stress test    ETT-Myoview 3/17: Hypertensive blood pressure response to exercise, normal perfusion, EF 60%, low risk study   Hypercholesterolemia    ? statin intolerant. May tolerate Lipitor   Normal nuclear stress test 12/13/2009   Obesity    Pulmonary embolism ([place]) 08/15/1977   Syncope and collapse 8-10 yrs ago    Past Surgical History:  Procedure Laterality Date   CARDIOVASCULAR STRESS TEST  12/13/2009   EF 73%; No ischemia   COLONOSCOPY  08/16/2011   IVC FILTER INSERTION N/A 10/06/2020   Procedure: IVC FILTER INSERTION;  Surgeon: Serafina Mitchell, MD;  Location: [facility];  Service: Cardiovascular;  Laterality: N/A;  w/ IVC Venogram   IVC FILTER REMOVAL N/A 01/05/2021   Procedure: IVC FILTER REMOVAL;  Surgeon: Serafina Mitchell, MD;  Location: [facility];  Service: Cardiovascular;  Laterality: N/A;   JOINT REPLACEMENT     KNEE ARTHROSCOPY Right 10/03/2012   Procedure: RIGHT KNEE ARTHROSCOPY WITH DEBRIDEMENT;  Surgeon: [name], MD;  Location: [facility];  Service: Orthopedics;  Laterality:  Right;  WITH DEBRIDEMENT   MOHS SURGERY  6 yrs    nose   SKIN CANCER EXCISION     left upper arm, biopsy   TOTAL HIP ARTHROPLASTY Right 10/06/2014    Medications Prior to Admission  Medication Sig Dispense Refill   acetaminophen (TYLENOL) 500 MG tablet Take 1,000 mg by mouth every 8 (eight) hours as needed for mild pain or headache.     diclofenac Sodium (VOLTAREN) 1 % GEL Apply 2 g topically every 30 (thirty) days.     metoprolol succinate (TOPROL-XL) 25 MG 24 hr tablet Take 1 tablet (25 mg total) by mouth daily. (Patient taking differently: Take 25 mg by mouth every evening.) 90 tablet 3   rivaroxaban (XARELTO) 20 MG TABS tablet TAKE 1 TABLET EVERY DAY WITH SUPPER 90 tablet 3   rosuvastatin (CRESTOR) 10 MG tablet TAKE 1 TABLET(10 MG) BY MOUTH DAILY (Patient taking differently: Take 10 mg by mouth every evening.) 30 tablet 11   Colchicine 0.6 MG CAPS TAKE 1 CAPSULE BY MOUTH DAILY (Patient taking differently: Take 0.6 mg by mouth daily as needed (gout flares).) 30 capsule 2   tadalafil (CIALIS) 5 MG tablet Take 5 mg by mouth daily as needed for erectile dysfunction.  10    Allergies: No Known Allergies  Family History  Adopted: Yes  Family history unknown: Yes    Social History:  reports that he quit smoking about 44  years ago. His smoking use included cigarettes. He has a 10.00 pack-year smoking history. He has never used smokeless tobacco. He reports current alcohol use of about 7.0 standard drinks of alcohol per week. He reports that he does not use drugs.   ROS: As per HPI, all others negative  Blood pressure (!) 143/51, pulse (!) 50, temperature (!) 97.5 F (36.4 C), temperature source Tympanic, resp. rate 15, height 5' 10.5" (1.791 m), weight 98.4 kg. General appearance: NAD HEENT:  [place]/AT, anicteric NECK:  Supple CV:  Regular ABD:  Soft, non-tender NEURO:  Non-focal, A/O, no encephalopathy  No results found.  Assessment/Plan   Colon cancer screening. Colonoscopy for  evaluation. Risks (bleeding, infection, bowel perforation that could require surgery, sedation-related changes in cardiopulmonary systems), benefits (identification and possible treatment of source of symptoms, exclusion of certain causes of symptoms), and alternatives (watchful waiting, radiographic imaging studies, empiric medical treatment) of colonoscopy were explained to patient/family in detail and patient wishes to proceed.  Chronic anticoagulation, rivaroxaban, on hold.  Landry Dyke 05/18/2022, 8:35 AM

## 2022-05-18 NOTE — Discharge Instructions (Signed)
Colonoscopy ° °Post procedure instructions: ° °Read the instructions outlined below and refer to this sheet in the next few weeks. These discharge instructions provide you with general information on caring for yourself after you leave the hospital. Your doctor may also give you specific instructions. While your treatment has been planned according to the most current medical practices available, unavoidable complications occasionally occur. If you have any problems or questions after discharge, call Dr. Lucyann Romano at [facility] ([phone]). ° °HOME CARE INSTRUCTIONS ° °ACTIVITY: °· You may resume your regular activity, but move at a slower pace for the next 24 hours.  °· Take frequent rest periods for the next 24 hours.  °· Walking will help get rid of the air and reduce the bloated feeling in your belly (abdomen).  °· No driving for 24 hours (because of the medicine (anesthesia) used during the test).  °· You may shower.  °· Do not sign any important legal documents or operate any machinery for 24 hours (because of the anesthesia used during the test).  °NUTRITION: °· Drink plenty of fluids.  °· You may resume your normal diet as instructed by your doctor.  °· Begin with a light meal and progress to your normal diet. Heavy or fried foods are harder to digest and may make you feel sick to your stomach (nauseated).  °· Avoid alcoholic beverages for 24 hours or as instructed.  °MEDICATIONS: °· You may resume your normal medications unless your doctor tells you otherwise.  °WHAT TO EXPECT TODAY: °· Some feelings of bloating in the abdomen.  °· Passage of more gas than usual.  °· Spotting of blood in your stool or on the toilet paper.  °IF YOU HAD POLYPS REMOVED DURING THE COLONOSCOPY: °· No aspirin products for 7 days or as instructed.  °· No alcohol for 7 days or as instructed.  °· Eat a soft diet for the next 24 hours.  ° °FINDING OUT THE RESULTS OF YOUR TEST ° °Not all test results are available during your  visit. If your test results are not back during the visit, make an appointment with your caregiver to find out the results. Do not assume everything is normal if you have not heard from your caregiver or the medical facility. It is important for you to follow up on all of your test results.  ° ° ° °SEEK IMMEDIATE MEDICAL CARE IF: ° °· You have more than a spotting of blood in your stool.  °· Your belly is swollen (abdominal distention).  °· You are nauseated or vomiting.  °· You have a fever.  °· You have abdominal pain or discomfort that is severe or gets worse throughout the day.  ° ° °Document Released: 03/15/2004 Document Revised: 04/13/2011 Document Reviewed: 03/13/2008 °ExitCare® Patient Information ©2012 [employer]. ° °

## 2022-05-18 NOTE — Transfer of Care (Signed)
Immediate Anesthesia Transfer of Care Note  Patient: Joe Davis  Procedure(s) Performed: COLONOSCOPY WITH PROPOFOL POLYPECTOMY  Patient Location: Endoscopy Unit  Anesthesia Type:MAC  Level of Consciousness: awake, alert  and oriented  Airway & Oxygen Therapy: Patient Spontanous Breathing and Patient connected to face mask oxygen  Post-op Assessment: Report given to RN and Post -op Vital signs reviewed and stable  Post vital signs: Reviewed and stable  Last Vitals:  Vitals Value Taken Time  BP 80/46 05/18/22 0922  Temp 36.1 C 05/18/22 0922  Pulse 67 05/18/22 0924  Resp 22 05/18/22 0924  SpO2 100 % 05/18/22 0924  Vitals shown include unvalidated device data.  Last Pain:  Vitals:   05/18/22 0922  TempSrc: Axillary      Patients Stated Pain Goal: 0 (14/43/15 4008)  Complications: No notable events documented.

## 2022-05-19 LAB — SURGICAL PATHOLOGY

## 2022-05-19 IMAGING — CT CT CHEST W/O CM
1 series · 15 of 34 positions shown, 19 images · non-contrast
Comparison: Cardiac CT dated 01/03/2020

CLINICAL DATA: Follow-up pulmonary nodule

EXAM:
CT CHEST WITHOUT CONTRAST
TECHNIQUE: Multidetector CT imaging of the chest was performed following the
standard protocol without IV contrast.

[Series 2: chest w/(date) · axial · 0.73mm/px · z∈[-324,-48]mm · 15 of 163 slices shown, 19 images]
[im 13/163  mediastinal]
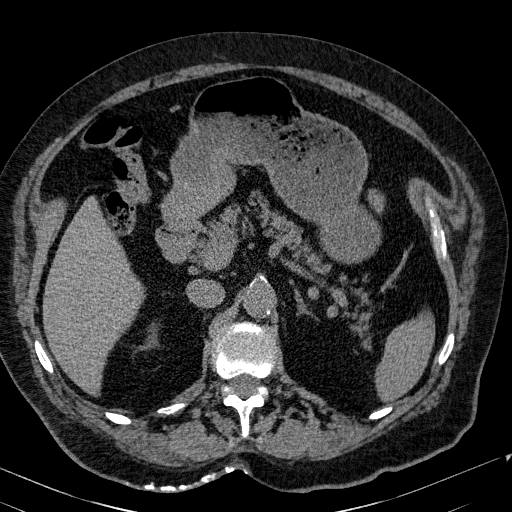
[im 13/163  lung]
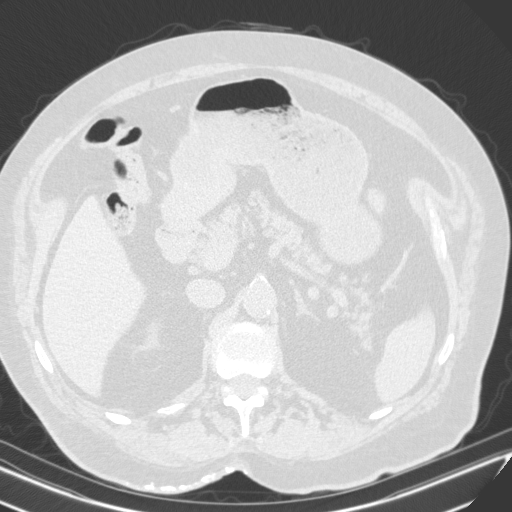
[im 25/163  lung]
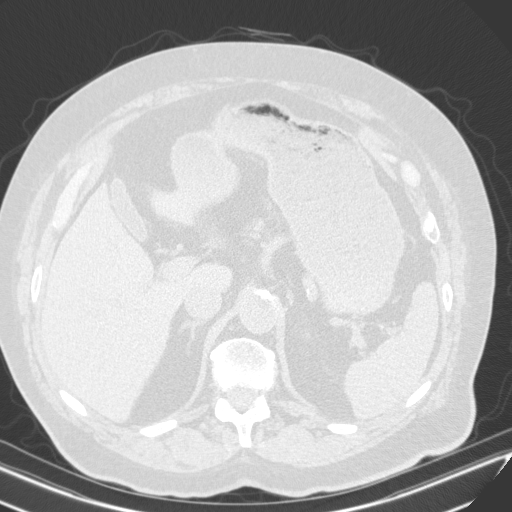
[im 33/163  lung]
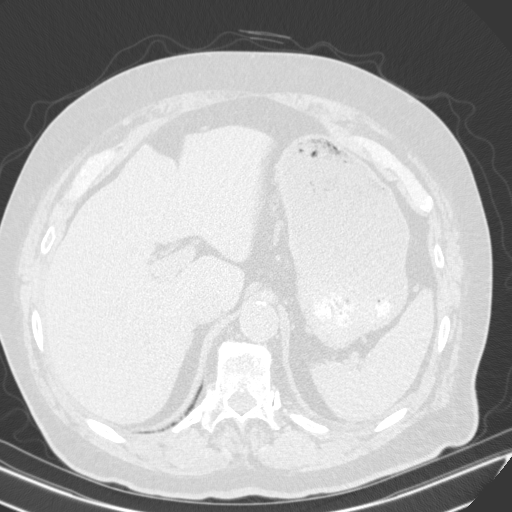
[im 43/163  lung]
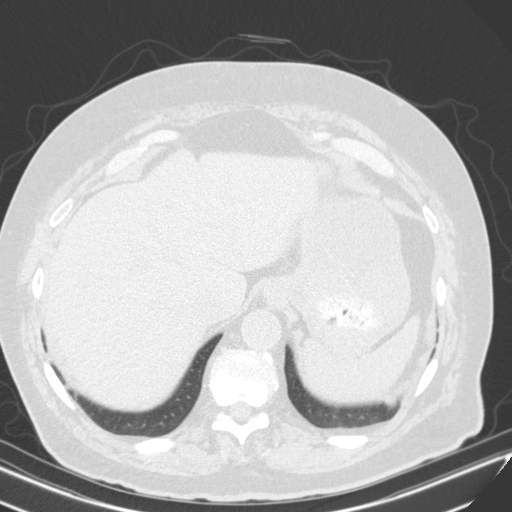
[im 55/163  mediastinal]
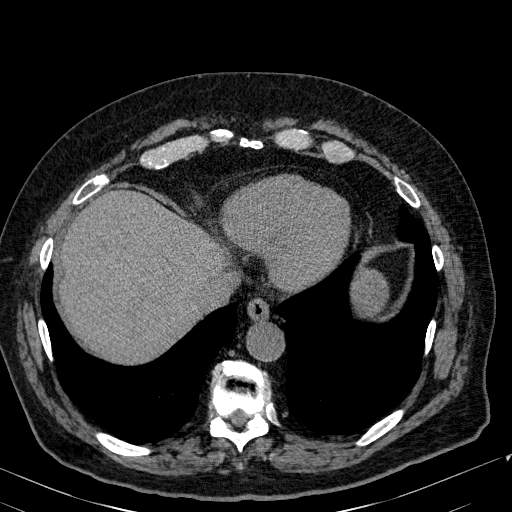
[im 55/163  lung]
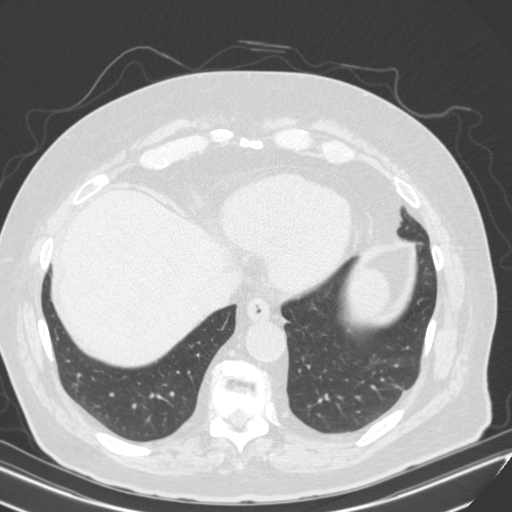
[im 65/163  lung]
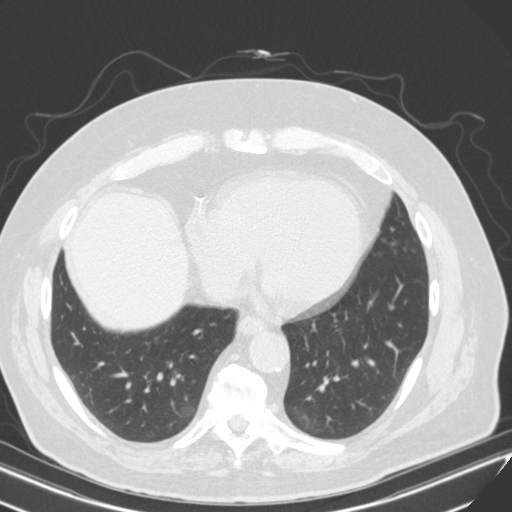
[im 73/163  lung]
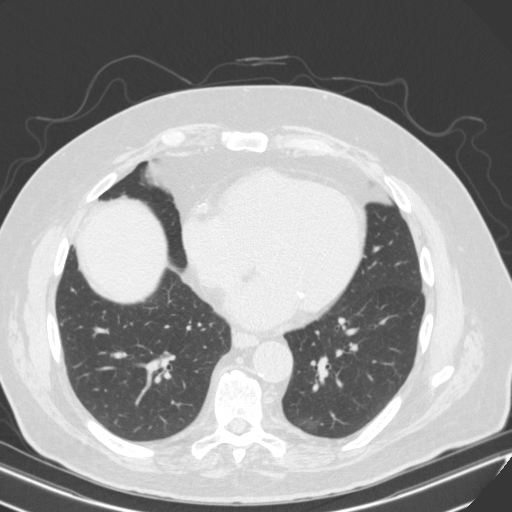
[im 85/163  lung]
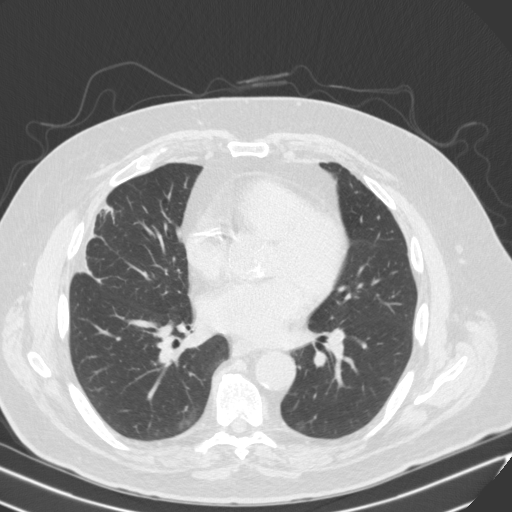
[im 91/163  mediastinal]
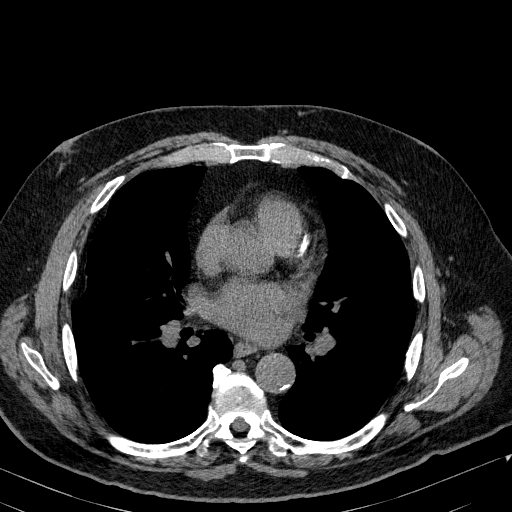
[im 91/163  lung]
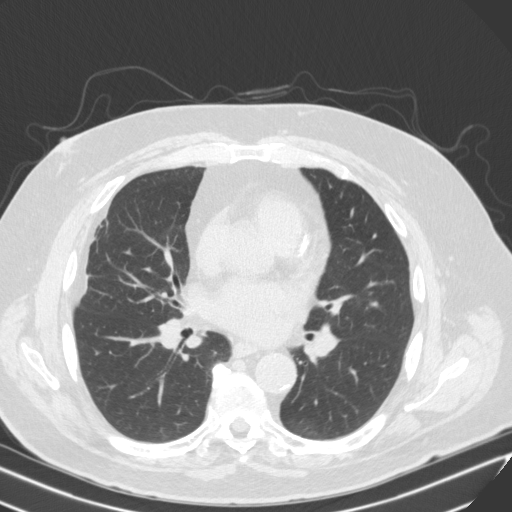
[im 98/163  lung]
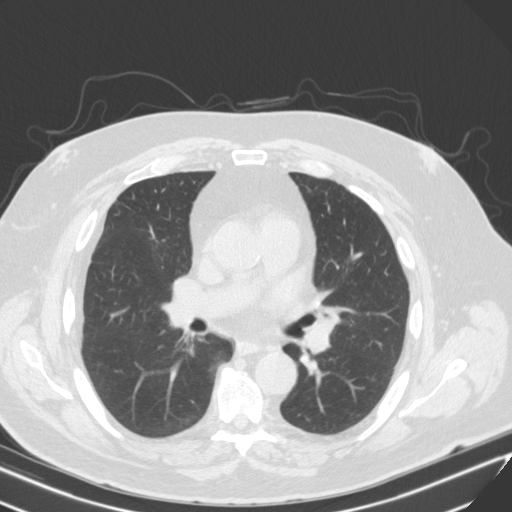
[im 109/163  lung]
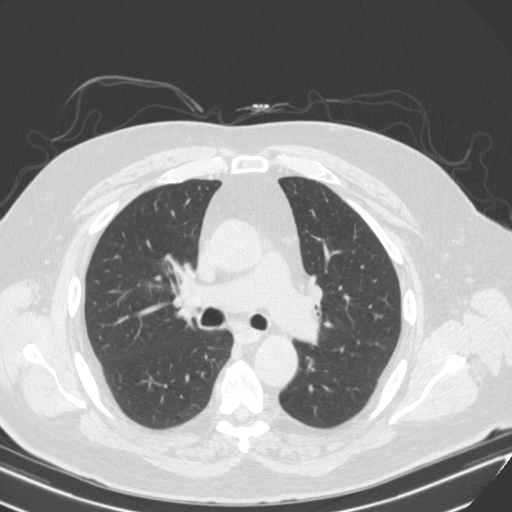
[im 121/163  lung]
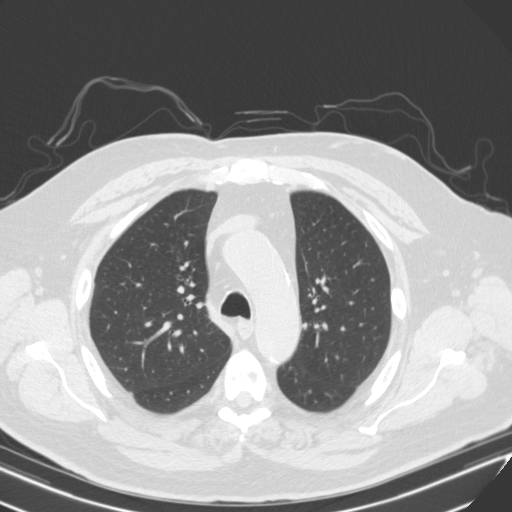
[im 130/163  mediastinal]
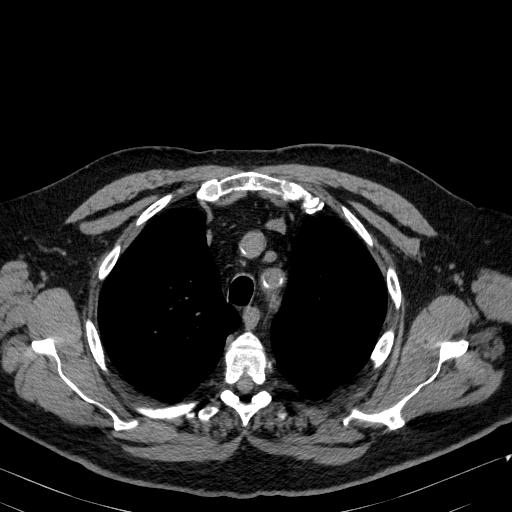
[im 130/163  lung]
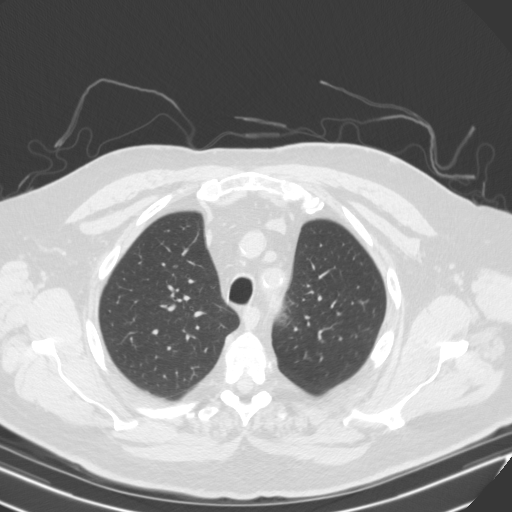
[im 139/163  lung]
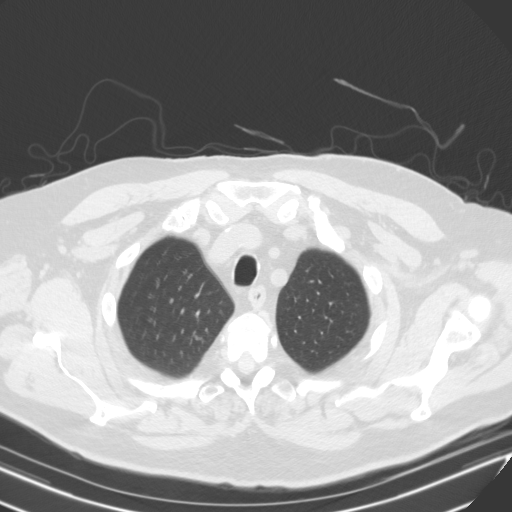
[im 151/163  lung]
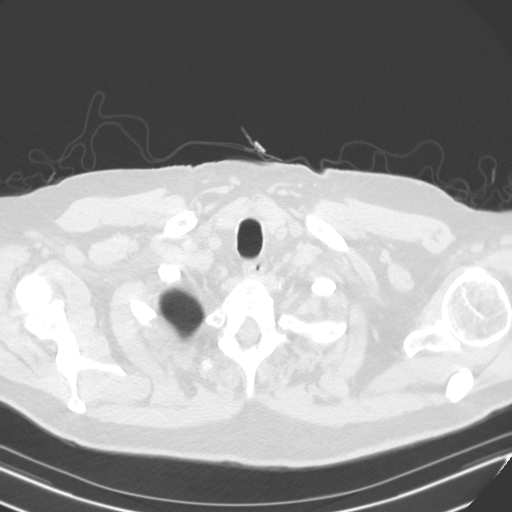

[15 of 34 positions shown; findings below may reference images not displayed]

FINDINGS: Cardiovascular: The heart is normal in size. No pericardial
effusion.

No evidence of thoracic aortic aneurysm. Atherosclerotic
calcifications of the aortic arch.

Three vessel coronary atherosclerosis.

Mediastinum/Nodes: No suspicious mediastinal lymphadenopathy.

Visualized thyroid is unremarkable.

Lungs/Pleura: Mild compressive atelectasis in the medial right lower
lobe.

Evaluation of the lung parenchyma is constrained by respiratory
motion. Within that constraint, there are no suspicious pulmonary
nodules.

Faint subpleural ground-glass opacities in the medial lower lungs
bilaterally (series 3/image 82), likely post
infectious/inflammatory.

Subpleural fat with overlying scarring in the lateral right middle
lobe (series 2/image 39).

No pleural effusion or pneumothorax.

Upper Abdomen: Visualized upper abdomen is grossly unremarkable,
noting vascular calcifications.

Musculoskeletal: Degenerative changes of the visualized
thoracolumbar spine.
IMPRESSION: No evidence of acute cardiopulmonary disease.

Aortic Atherosclerosis ([id number]).

## 2022-05-23 ENCOUNTER — Encounter (HOSPITAL_COMMUNITY): Payer: Self-pay | Admitting: Gastroenterology

## 2022-05-24 NOTE — Progress Notes (Signed)
Carelink Summary Report / Loop Recorder 

## 2022-05-25 DIAGNOSIS — H0102B Squamous blepharitis left eye, upper and lower eyelids: Secondary | ICD-10-CM | POA: Diagnosis not present

## 2022-05-25 DIAGNOSIS — H00021 Hordeolum internum right upper eyelid: Secondary | ICD-10-CM | POA: Diagnosis not present

## 2022-06-13 ENCOUNTER — Ambulatory Visit: Payer: Medicare Other | Attending: Internal Medicine

## 2022-06-13 DIAGNOSIS — R55 Syncope and collapse: Secondary | ICD-10-CM

## 2022-06-14 LAB — CUP PACEART REMOTE DEVICE CHECK
Date Time Interrogation Session: 20231025231113
Implantable Pulse Generator Implant Date: 20220607

## 2022-06-26 ENCOUNTER — Other Ambulatory Visit: Payer: Self-pay | Admitting: Physician Assistant

## 2022-06-27 NOTE — Telephone Encounter (Signed)
Prescription refill request for Xarelto received.  Indication:afib Last office visit:4/23 Weight:98.4 kg Age:76 Scr:0.8 CrCl:105.92  ml/min  Prescription refilled

## 2022-07-15 ENCOUNTER — Encounter: Payer: Self-pay | Admitting: Family Medicine

## 2022-07-15 ENCOUNTER — Telehealth (INDEPENDENT_AMBULATORY_CARE_PROVIDER_SITE_OTHER): Payer: Medicare Other | Admitting: Family Medicine

## 2022-07-15 VITALS — Temp 101.0°F | Ht 70.5 in | Wt 217.0 lb

## 2022-07-15 DIAGNOSIS — J101 Influenza due to other identified influenza virus with other respiratory manifestations: Secondary | ICD-10-CM | POA: Diagnosis not present

## 2022-07-15 DIAGNOSIS — R059 Cough, unspecified: Secondary | ICD-10-CM

## 2022-07-15 DIAGNOSIS — U071 COVID-19: Secondary | ICD-10-CM | POA: Diagnosis not present

## 2022-07-15 LAB — POCT INFLUENZA A/B: Influenza A, POC: POSITIVE — AB

## 2022-07-15 LAB — POC COVID19 BINAXNOW: SARS Coronavirus 2 Ag: POSITIVE — AB

## 2022-07-15 MED ORDER — ALBUTEROL SULFATE HFA 108 (90 BASE) MCG/ACT IN AERS: 2.0000 | INHALATION_SPRAY | Freq: Four times a day (QID) | RESPIRATORY_TRACT | 0 refills | Status: DC | PRN

## 2022-07-15 MED ORDER — MOLNUPIRAVIR EUA 200MG CAPSULE: 4.0000 | ORAL_CAPSULE | Freq: Two times a day (BID) | ORAL | 0 refills | Status: AC

## 2022-07-15 MED ORDER — OSELTAMIVIR PHOSPHATE 75 MG PO CAPS: 75.0000 mg | ORAL_CAPSULE | Freq: Two times a day (BID) | ORAL | 0 refills | Status: DC

## 2022-07-15 MED ORDER — PREDNISONE 20 MG PO TABS: ORAL_TABLET | ORAL | 0 refills | Status: DC

## 2022-07-15 NOTE — Progress Notes (Signed)
Patient ID: Joe Davis, male   DOB: Sep 08, 1945, 76 y.o.   MRN: [id number]   Virtual Visit via Telephone Note  I connected with Joe Davis on 07/15/22 at 10:00 AM EST by telephone and verified that I am speaking with the correct person using two identifiers.   I discussed the limitations, risks, security and privacy concerns of performing an evaluation and management service by telephone and the availability of in person appointments. I also discussed with the patient that there may be a patient responsible charge related to this service. The patient expressed understanding and agreed to proceed.  Location patient: home Location provider: work or home office Participants present for the call: patient, provider Patient did not have a visit in the prior 7 days to address this/these issue(s).   History of Present Illness: Joe Davis has febrile illness.  Onset late Tuesday.  He developed chills, body aches, cough, wheezing.  No dyspnea.  Denies any nausea, vomiting, or diarrhea.  No known sick contacts.  He is having some wheezing.  Symptoms overall relatively stable since onset.  Past medical history significant for history of DVT, CAD, atrial fibrillation.  Takes Xarelto.  Past Medical History:  Diagnosis Date   Arthritis    "knees, hips" (10/10/2014)   Basal cell carcinoma    BPH (benign prostatic hyperplasia)    Bradycardia vasovagal response 10 yrs ago   asymptomatic   Complication of anesthesia    bradycardia with spinal   Coronary artery disease    Diastolic dysfunction    Per echo in 2008   DVT (deep venous thrombosis) ([place]) 08/15/1977   RLE   Erectile dysfunction    History of gout    History of nuclear stress test    ETT-Myoview 3/17: Hypertensive blood pressure response to exercise, normal perfusion, EF 60%, low risk study   Hypercholesterolemia    ? statin intolerant. May tolerate Lipitor   Normal nuclear stress test 12/13/2009   Obesity    Pulmonary embolism  ([place]) 08/15/1977   Syncope and collapse 8-10 yrs ago   Past Surgical History:  Procedure Laterality Date   CARDIOVASCULAR STRESS TEST  12/13/2009   EF 73%; No ischemia   COLONOSCOPY  08/16/2011   COLONOSCOPY WITH PROPOFOL N/A 05/18/2022   Procedure: COLONOSCOPY WITH PROPOFOL;  Surgeon: Arta Silence, MD;  Location: [facility];  Service: Gastroenterology;  Laterality: N/A;   IVC FILTER INSERTION N/A 10/06/2020   Procedure: IVC FILTER INSERTION;  Surgeon: Serafina Mitchell, MD;  Location: [facility];  Service: Cardiovascular;  Laterality: N/A;  w/ IVC Venogram   IVC FILTER REMOVAL N/A 01/05/2021   Procedure: IVC FILTER REMOVAL;  Surgeon: Serafina Mitchell, MD;  Location: [facility];  Service: Cardiovascular;  Laterality: N/A;   JOINT REPLACEMENT     KNEE ARTHROSCOPY Right 10/03/2012   Procedure: RIGHT KNEE ARTHROSCOPY WITH DEBRIDEMENT;  Surgeon: [name], MD;  Location: [facility];  Service: Orthopedics;  Laterality: Right;  WITH DEBRIDEMENT   MOHS SURGERY  6 yrs    nose   POLYPECTOMY  05/18/2022   Procedure: POLYPECTOMY;  Surgeon: Arta Silence, MD;  Location: [facility];  Service: Gastroenterology;;   SKIN CANCER EXCISION     left upper arm, biopsy   TOTAL HIP ARTHROPLASTY Right 10/06/2014    reports that he quit smoking about 44 years ago. His smoking use included cigarettes. He has a 10.00 pack-year smoking history. He has never used smokeless tobacco. He reports current alcohol  use of about 7.0 standard drinks of alcohol per week. He reports that he does not use drugs. He was adopted. Family history is unknown by patient. No Known Allergies    Observations/Objective: Patient sounds cheerful and well on the phone. I do not appreciate any SOB. Speech and thought processing are grossly intact. Patient reported vitals:  Assessment and Plan:  Patient tested positive for both influenza (A) and COVID  -He does describe some reactive airway issues with  wheezing.  No respiratory distress.  Refill albuterol MDI 2 puffs every 6 hours as needed.  Prednisone 20 mg take 2 tablets by mouth once daily for 5 days -Start molnupiravir 4 capsules by mouth twice daily for 5 days -Even though he is slightly past 48 hours given his age we elected to go ahead and cover with Tamiflu 75 mg twice daily for 5 days -Plenty fluids and rest -Follow-up promptly for any dyspnea or worsening symptoms  Follow Up Instructions:    [id number] 5-10 [id number] 11-20 [id number] 21-30 I did not refer this patient for an OV in the next 24 hours for this/these issue(s).  I discussed the assessment and treatment plan with the patient. The patient was provided an opportunity to ask questions and all were answered. The patient agreed with the plan and demonstrated an understanding of the instructions.   The patient was advised to call back or seek an in-person evaluation if the symptoms worsen or if the condition fails to improve as anticipated.  I provided 23 minutes of non-face-to-face time during this encounter.   Carolann Littler, MD

## 2022-07-16 NOTE — Progress Notes (Signed)
Carelink Summary Report / Loop Recorder 

## 2022-07-18 ENCOUNTER — Ambulatory Visit (INDEPENDENT_AMBULATORY_CARE_PROVIDER_SITE_OTHER): Payer: Medicare Other

## 2022-07-18 DIAGNOSIS — R55 Syncope and collapse: Secondary | ICD-10-CM | POA: Diagnosis not present

## 2022-07-18 LAB — CUP PACEART REMOTE DEVICE CHECK
Date Time Interrogation Session: 20231203232105
Implantable Pulse Generator Implant Date: 20220607

## 2022-07-19 ENCOUNTER — Telehealth: Payer: Self-pay | Admitting: Cardiovascular Disease

## 2022-07-19 NOTE — Telephone Encounter (Signed)
The patient has Covid. The patient wife states he has been in A-fib for three days now. I let Sonia Baller review his transmission and speak with the wife.

## 2022-07-19 NOTE — Telephone Encounter (Signed)
Called patient's wife to see if there was anything else she needed. She stated PCP started her husband on antiviral medications and patient is doing fine right now. Will forward to [name] just so he is aware.

## 2022-07-19 NOTE — Telephone Encounter (Signed)
Spoke with Pt's wife.  Confirmed Pt is in atrial fibrillation onset 07/18/2022 at 7:36 am.  Known h/o of PAF on Xarelto.  Wife thanked Marine scientist for confirming.

## 2022-07-19 NOTE — Telephone Encounter (Signed)
  Patient c/o Palpitations:  High priority if patient c/o lightheadedness, shortness of breath, or chest pain  How long have you had palpitations/irregular HR/ Afib? Are you having the symptoms now? No   Are you currently experiencing lightheadedness, SOB or CP? Felt only 1 episode of lightheadedness   Do you have a history of afib (atrial fibrillation) or irregular heart rhythm? Yes   Have you checked your BP or HR? (document readings if available): 80-90s HR   Are you experiencing any other symptoms?   Pt's wife calling, she said, pt been in and out of afib for the last 3 days they need recommendations from [name], since pt has covid and flu right now they are in quarantine and can't go anywhere

## 2022-07-20 ENCOUNTER — Ambulatory Visit (INDEPENDENT_AMBULATORY_CARE_PROVIDER_SITE_OTHER): Payer: Medicare Other

## 2022-07-20 ENCOUNTER — Ambulatory Visit: Payer: Medicare Other | Admitting: Family Medicine

## 2022-07-20 DIAGNOSIS — R059 Cough, unspecified: Secondary | ICD-10-CM

## 2022-07-20 LAB — POCT INFLUENZA A/B
Influenza A, POC: NEGATIVE
Influenza B, POC: NEGATIVE

## 2022-07-20 LAB — POC COVID19 BINAXNOW: SARS Coronavirus 2 Ag: NEGATIVE

## 2022-07-27 ENCOUNTER — Telehealth: Payer: Self-pay

## 2022-07-27 DIAGNOSIS — M542 Cervicalgia: Secondary | ICD-10-CM | POA: Diagnosis not present

## 2022-07-27 NOTE — Telephone Encounter (Signed)
Following alert received from [employer] received for ILR alert summary report received. Battery status OK. Normal device function. No new symptom, tachy, brady, or pause episodes. Persistent AF/AFL, overall controlled rates, burden 99.8%, Xarelto.   Note increased AF burden.

## 2022-07-29 NOTE — Telephone Encounter (Signed)
Ask patient how he is feeling. If no change, the f/u in 3 months. If feels worse/bad, then refer to atrial fib clinic

## 2022-08-01 NOTE — Telephone Encounter (Signed)
Spoke with patient, reviewed his increase in AF Burden.   He is feeling well today; but does report having the flu since Thanksgiving which may have contributed to the up-tick.   Patient would like referral to the afib clinic.  Will forward to Rushie Goltz, RN to contact wife at the home number and get him set up for further follow up.

## 2022-08-01 NOTE — Telephone Encounter (Signed)
Forwarding to [facility] for follow up.

## 2022-08-08 ENCOUNTER — Other Ambulatory Visit: Payer: Self-pay | Admitting: Family Medicine

## 2022-08-16 ENCOUNTER — Other Ambulatory Visit: Payer: Self-pay | Admitting: Physician Assistant

## 2022-08-17 ENCOUNTER — Ambulatory Visit (INDEPENDENT_AMBULATORY_CARE_PROVIDER_SITE_OTHER): Payer: Medicare Other | Admitting: *Deleted

## 2022-08-17 DIAGNOSIS — Z23 Encounter for immunization: Secondary | ICD-10-CM

## 2022-08-22 ENCOUNTER — Ambulatory Visit (INDEPENDENT_AMBULATORY_CARE_PROVIDER_SITE_OTHER): Payer: Medicare Other

## 2022-08-22 DIAGNOSIS — R55 Syncope and collapse: Secondary | ICD-10-CM

## 2022-08-23 ENCOUNTER — Ambulatory Visit (HOSPITAL_COMMUNITY): Payer: Medicare Other | Admitting: Physician Assistant

## 2022-08-23 LAB — CUP PACEART REMOTE DEVICE CHECK
Date Time Interrogation Session: 20240107231430
Implantable Pulse Generator Implant Date: 20220607

## 2022-08-25 NOTE — Progress Notes (Signed)
Carelink Summary Report / Loop Recorder 

## 2022-08-29 ENCOUNTER — Ambulatory Visit (HOSPITAL_COMMUNITY)
Admission: RE | Admit: 2022-08-29 | Discharge: 2022-08-29 | Disposition: A | Discharge status: Home / Self Care | Payer: Medicare Other | Source: Ambulatory Visit | Attending: Nurse Practitioner | Admitting: Nurse Practitioner

## 2022-08-29 VITALS — BP 156/92 | HR 88 | Ht 70.5 in | Wt 230.2 lb

## 2022-08-29 DIAGNOSIS — I4819 Other persistent atrial fibrillation: Secondary | ICD-10-CM

## 2022-08-29 DIAGNOSIS — I10 Essential (primary) hypertension: Secondary | ICD-10-CM | POA: Insufficient documentation

## 2022-08-29 DIAGNOSIS — Z7901 Long term (current) use of anticoagulants: Secondary | ICD-10-CM | POA: Insufficient documentation

## 2022-08-29 DIAGNOSIS — I82409 Acute embolism and thrombosis of unspecified deep veins of unspecified lower extremity: Secondary | ICD-10-CM | POA: Diagnosis not present

## 2022-08-29 DIAGNOSIS — I2510 Atherosclerotic heart disease of native coronary artery without angina pectoris: Secondary | ICD-10-CM | POA: Diagnosis not present

## 2022-08-29 DIAGNOSIS — I480 Paroxysmal atrial fibrillation: Secondary | ICD-10-CM

## 2022-08-29 LAB — BASIC METABOLIC PANEL
Anion gap: 9 (ref 5–15)
BUN: 18 mg/dL (ref 8–23)
CO2: 20 mmol/L — ABNORMAL LOW (ref 22–32)
Calcium: 8.6 mg/dL — ABNORMAL LOW (ref 8.9–10.3)
Chloride: 108 mmol/L (ref 98–111)
Creatinine, Ser: 0.85 mg/dL (ref 0.61–1.24)
GFR, Estimated: >60 mL/min (ref >60)
Glucose, Bld: 110 mg/dL — ABNORMAL HIGH (ref 70–99)
Potassium: 4.1 mmol/L (ref 3.5–5.1)
Sodium: 137 mmol/L (ref 135–145)

## 2022-08-29 LAB — CBC
HCT: 45.5 % (ref 39.0–52.0)
Hemoglobin: 15 g/dL (ref 13.0–17.0)
MCH: 29.3 pg (ref 26.0–34.0)
MCHC: 33 g/dL (ref 30.0–36.0)
MCV: 88.9 fL (ref 80.0–100.0)
Platelets: 210 10*3/uL (ref 150–400)
RBC: 5.12 MIL/uL (ref 4.22–5.81)
RDW: 14.3 % (ref 11.5–15.5)
WBC: 6.3 10*3/uL (ref 4.0–10.5)
nRBC: 0 % (ref 0.0–0.2)

## 2022-08-29 NOTE — H&P (View-Only) (Signed)
Primary Care Physician: [name], MD Referring Physician: 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 EKG today shows afib rate controlled. Appears to be persistent since 07/19/22. He is tolerating well, with minimal symptoms. He is on xarelto for a prior DVT/PE as well as CHA2DS2VASc score of at least 5, no missed doses.  He has a h/o of prior syncope for which he received a loop back in 2021. HIs wife, a prior anesthetist, has the understanding it was possibly from  post afib termination pauses. She is concerned re pt undergoing a cardioversion. However, Dr. [name] note said his symptoms were fairly typcial for neurally mediated syncope. After exploring risk vrs benefit, pt and wife want to pursue DCCV, as pt has noted some mild dyspnea and fatigue. He has been fairly well rate controlled.   Today, he denies symptoms of palpitations, chest pain, shortness of breath, orthopnea, PND, lower extremity edema, dizziness, presyncope, syncope, or neurologic sequela. The patient is tolerating medications without difficulties and is otherwise without complaint today.   Past Medical History:  Diagnosis Date   Arthritis    "knees, hips" (10/10/2014)   Basal cell carcinoma    BPH (benign prostatic hyperplasia)    Bradycardia vasovagal response 10 yrs ago   asymptomatic   Complication of anesthesia    bradycardia with spinal   Coronary artery disease    Diastolic dysfunction    Per echo in 2008   DVT (deep venous thrombosis) ([place]) 08/15/1977   RLE   Erectile dysfunction    History of gout    History of nuclear stress test    ETT-Myoview 3/17: Hypertensive blood pressure response to exercise, normal perfusion, EF 60%, low risk study   Hypercholesterolemia    ? statin intolerant. May tolerate Lipitor   Normal  nuclear stress test 12/13/2009   Obesity    Pulmonary embolism ([place]) 08/15/1977   Syncope and collapse 8-10 yrs ago   Past Surgical History:  Procedure Laterality Date   CARDIOVASCULAR STRESS TEST  12/13/2009   EF 73%; No ischemia   COLONOSCOPY  08/16/2011   COLONOSCOPY WITH PROPOFOL N/A 05/18/2022   Procedure: COLONOSCOPY WITH PROPOFOL;  Surgeon: Arta Silence, MD;  [facility]: [facility];  Service: Gastroenterology;  Laterality: N/A;   IVC FILTER INSERTION N/A 10/06/2020   Procedure: IVC FILTER INSERTION;  Surgeon: Serafina Mitchell, MD;  Location: [place] [facility];  Service: Cardiovascular;  Laterality: N/A;  w/ IVC Venogram   IVC FILTER REMOVAL N/A 01/05/2021   Procedure: IVC FILTER REMOVAL;  Surgeon: Serafina Mitchell, MD;  Location: [place] [facility];  Service: Cardiovascular;  Laterality: N/A;   JOINT REPLACEMENT     KNEE ARTHROSCOPY Right 10/03/2012   Procedure: RIGHT KNEE ARTHROSCOPY WITH DEBRIDEMENT;  Surgeon: [name], MD;  Location: [facility];  Service: Orthopedics;  Laterality: Right;  WITH DEBRIDEMENT   MOHS SURGERY  6 yrs    nose   POLYPECTOMY  05/18/2022   Procedure: POLYPECTOMY;  Surgeon: Arta Silence, MD;  [facility];  Service: Gastroenterology;;   SKIN CANCER EXCISION     left upper arm, biopsy   TOTAL HIP ARTHROPLASTY Right 10/06/2014    Current Outpatient Medications  Medication Sig Dispense Refill   acetaminophen (TYLENOL)  500 MG tablet Take 1,000 mg by mouth every 8 (eight) hours as needed for mild pain or headache.     albuterol (VENTOLIN HFA) 108 (90 Base) MCG/ACT inhaler INHALE 2 PUFFS INTO THE LUNGS EVERY 6 HOURS AS NEEDED FOR WHEEZING OR SHORTNESS OF BREATH 18 g 0   Colchicine 0.6 MG CAPS TAKE 1 CAPSULE BY MOUTH DAILY (Patient taking differently: Take 0.6 mg by mouth as needed (gout flares).) 30 capsule 2   diclofenac Sodium (VOLTAREN) 1 % GEL Apply 2 g topically every 30 (thirty) days.     metoprolol succinate (TOPROL-XL) 25 MG 24 hr  tablet Take 1 tablet (25 mg total) by mouth every evening. 90 tablet 0   rivaroxaban (XARELTO) 20 MG TABS tablet TAKE 1 TABLET EVERY DAY WITH SUPPER 90 tablet 5   rosuvastatin (CRESTOR) 10 MG tablet TAKE 1 TABLET(10 MG) BY MOUTH DAILY (Patient taking differently: Take 10 mg by mouth every evening.) 30 tablet 11   tadalafil (CIALIS) 5 MG tablet Take 5 mg by mouth daily as needed for erectile dysfunction.  10   No current facility-administered medications for this encounter.    No Known Allergies  Social History   Socioeconomic History   Marital status: Married    Spouse name: Not on file   Number of children: Not on file   Years of education: Not on file   Highest education level: Not on file  Occupational History   Not on file  Tobacco Use   Smoking status: Former    Packs/day: 1.00    Years: 10.00    Total pack years: 10.00    Types: Cigarettes    Quit date: 08/15/1977    Years since quitting: 45.0   Smokeless tobacco: Never  Vaping Use   Vaping Use: Never used  Substance and Sexual Activity   Alcohol use: Yes    Alcohol/week: 7.0 standard drinks of alcohol    Types: 7 Shots of liquor per week   Drug use: No   Sexual activity: Yes  Other Topics Concern   Not on file  Social History Narrative   Not on file   Social Determinants of Health   Financial Resource Strain: Low Risk  (11/24/2021)   Overall Financial Resource Strain (CARDIA)    Difficulty of Paying Living Expenses: Not hard at all  Food Insecurity: No Food Insecurity (11/24/2021)   Hunger Vital Sign    Worried About Running Out of Food in the Last Year: Never true    Ran Out of Food in the Last Year: Never true  Transportation Needs: No Transportation Needs (11/24/2021)   PRAPARE - Hydrologist (Medical): No    Lack of Transportation (Non-Medical): No  Physical Activity: Sufficiently Active (11/24/2021)   Exercise Vital Sign    Days of Exercise per Week: 5 days    Minutes of  Exercise per Session: 60 min  Stress: No Stress Concern Present (11/24/2021)   [place]    Feeling of Stress : Not at all  Social Connections: Moderately Isolated (11/24/2021)   Social Connection and Isolation Panel [NHANES]    Frequency of Communication with Friends and Family: More than three times a week    Frequency of Social Gatherings with Friends and Family: More than three times a week    Attends Religious Services: Never    Marine scientist or Organizations: No    Attends Archivist Meetings:  Never    Marital Status: Married  Human resources officer Violence: Not At Risk (11/24/2021)   Humiliation, Afraid, Rape, and Kick questionnaire    Fear of Current or Ex-Partner: No    Emotionally Abused: No    Physically Abused: No    Sexually Abused: No    Family History  Adopted: Yes  Family history unknown: Yes    ROS- All systems are reviewed and negative except as per the HPI above  Physical Exam: Vitals:   08/29/22 0903  Weight: 104.4 kg  Height: 5' 10.5" (1.791 m)   Wt Readings from Last 3 Encounters:  08/29/22 104.4 kg  07/15/22 98.4 kg  05/18/22 98.4 kg    Labs: Lab Results  Component Value Date   NA 141 09/07/2021   K 4.6 09/07/2021   CL 105 09/07/2021   CO2 25 09/07/2021   GLUCOSE 107 (H) 09/07/2021   BUN 17 09/07/2021   CREATININE 0.89 09/07/2021   CALCIUM 9.0 09/07/2021   Lab Results  Component Value Date   INR 1.11 10/10/2014   Lab Results  Component Value Date   CHOL 133 11/16/2021   HDL 44.90 11/16/2021   LDLCALC 75 11/16/2021   TRIG 66.0 11/16/2021     GEN- The patient is well appearing, alert and oriented x 3 today.   Head- normocephalic, atraumatic Eyes-  Sclera clear, conjunctiva pink Ears- hearing intact Oropharynx- clear Neck- supple, no JVP Lymph- no cervical lymphadenopathy Lungs- Clear to ausculation bilaterally, normal work of breathing Heart-  irregular rate and rhythm, no murmurs, rubs or gallops, PMI not laterally displaced GI- soft, NT, ND, + BS Extremities- no clubbing, cyanosis, or edema MS- no significant deformity or atrophy Skin- no rash or lesion Psych- euthymic mood, full affect Neuro- strength and sensation are intact  EKG-Vent. rate 88 BPM PR interval * ms QRS duration 110 ms QT/QTcB 352/425 ms P-R-T axes * -54 42 Atrial fibrillation Left anterior fascicular block Minimal voltage criteria for LVH, may be normal variant ( R in aVL ) Abnormal ECG When compared with ECG of 15-Oct-2020 09:06, PREVIOUS ECG IS PRESENT    Assessment and Plan:  1. Persistent Afib  Persistent since 07/19/22. He had the flu shortly before this which may have been the trigger. He has tolerated fairly well but pt has noted he has been more short of breath and some fatigue  We discussed cardioversion, risk vrs benefit and they want to proceed Wife  was under the impression that pt had syncope in the past due to post conversion pauses, but Dr. Lovena Le in his note indicated neurally mediated syncope. He will continue metoprolol succinate 25 mg qd  2. CHA2DS2VASc score of at least 5 No missed doses of xarelto 20 mg daily and reminded no to miss any doses with pending cardioversion   DCCV scheduled for 1/22 at 11 am Cbc/bmet today   08/31/22- 1:17 pm- Chart reviewed by Dr. Lovena Le and he agrees to proceed with cardioversion.   Geroge Baseman Sharine Cadle, [facility] [address] [place], [place] [address] [phone]

## 2022-08-29 NOTE — Progress Notes (Addendum)
Primary Care Physician: [name], MD Referring Physician: 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 EKG today shows afib rate controlled. Appears to be persistent since 07/19/22. He is tolerating well, with minimal symptoms. He is on xarelto for a prior DVT/PE as well as CHA2DS2VASc score of at least 5, no missed doses.  He has a h/o of prior syncope for which he received a loop back in 2021. HIs wife, a prior anesthetist, has the understanding it was possibly from  post afib termination pauses. She is concerned re pt undergoing a cardioversion. However, Dr. [name] note said his symptoms were fairly typcial for neurally mediated syncope. After exploring risk vrs benefit, pt and wife want to pursue DCCV, as pt has noted some mild dyspnea and fatigue. He has been fairly well rate controlled.   Today, he denies symptoms of palpitations, chest pain, shortness of breath, orthopnea, PND, lower extremity edema, dizziness, presyncope, syncope, or neurologic sequela. The patient is tolerating medications without difficulties and is otherwise without complaint today.   Past Medical History:  Diagnosis Date   Arthritis    "knees, hips" (10/10/2014)   Basal cell carcinoma    BPH (benign prostatic hyperplasia)    Bradycardia vasovagal response 10 yrs ago   asymptomatic   Complication of anesthesia    bradycardia with spinal   Coronary artery disease    Diastolic dysfunction    Per echo in 2008   DVT (deep venous thrombosis) ([place]) 08/15/1977   RLE   Erectile dysfunction    History of gout    History of nuclear stress test    ETT-Myoview 3/17: Hypertensive blood pressure response to exercise, normal perfusion, EF 60%, low risk study   Hypercholesterolemia    ? statin intolerant. May tolerate Lipitor   Normal  nuclear stress test 12/13/2009   Obesity    Pulmonary embolism ([place]) 08/15/1977   Syncope and collapse 8-10 yrs ago   Past Surgical History:  Procedure Laterality Date   CARDIOVASCULAR STRESS TEST  12/13/2009   EF 73%; No ischemia   COLONOSCOPY  08/16/2011   COLONOSCOPY WITH PROPOFOL N/A 05/18/2022   Procedure: COLONOSCOPY WITH PROPOFOL;  Surgeon: Arta Silence, MD;  [facility];  Service: Gastroenterology;  Laterality: N/A;   IVC FILTER INSERTION N/A 10/06/2020   Procedure: IVC FILTER INSERTION;  Surgeon: Serafina Mitchell, MD;  Location: [place] [facility];  Service: Cardiovascular;  Laterality: N/A;  w/ IVC Venogram   IVC FILTER REMOVAL N/A 01/05/2021   Procedure: IVC FILTER REMOVAL;  Surgeon: Serafina Mitchell, MD;  Location: [facility];  Service: Cardiovascular;  Laterality: N/A;   JOINT REPLACEMENT     KNEE ARTHROSCOPY Right 10/03/2012   Procedure: RIGHT KNEE ARTHROSCOPY WITH DEBRIDEMENT;  Surgeon: [name], MD;  [facility];  Service: Orthopedics;  Laterality: Right;  WITH DEBRIDEMENT   MOHS SURGERY  6 yrs    nose   POLYPECTOMY  05/18/2022   Procedure: POLYPECTOMY;  Surgeon: Arta Silence, MD;  [facility];  Service: Gastroenterology;;   SKIN CANCER EXCISION     left upper arm, biopsy   TOTAL HIP ARTHROPLASTY Right 10/06/2014    Current Outpatient Medications  Medication Sig Dispense Refill   acetaminophen (TYLENOL)  500 MG tablet Take 1,000 mg by mouth every 8 (eight) hours as needed for mild pain or headache.     albuterol (VENTOLIN HFA) 108 (90 Base) MCG/ACT inhaler INHALE 2 PUFFS INTO THE LUNGS EVERY 6 HOURS AS NEEDED FOR WHEEZING OR SHORTNESS OF BREATH 18 g 0   Colchicine 0.6 MG CAPS TAKE 1 CAPSULE BY MOUTH DAILY (Patient taking differently: Take 0.6 mg by mouth as needed (gout flares).) 30 capsule 2   diclofenac Sodium (VOLTAREN) 1 % GEL Apply 2 g topically every 30 (thirty) days.     metoprolol succinate (TOPROL-XL) 25 MG 24 hr  tablet Take 1 tablet (25 mg total) by mouth every evening. 90 tablet 0   rivaroxaban (XARELTO) 20 MG TABS tablet TAKE 1 TABLET EVERY DAY WITH SUPPER 90 tablet 5   rosuvastatin (CRESTOR) 10 MG tablet TAKE 1 TABLET(10 MG) BY MOUTH DAILY (Patient taking differently: Take 10 mg by mouth every evening.) 30 tablet 11   tadalafil (CIALIS) 5 MG tablet Take 5 mg by mouth daily as needed for erectile dysfunction.  10   No current facility-administered medications for this encounter.    No Known Allergies  Social History   Socioeconomic History   Marital status: Married    Spouse name: Not on file   Number of children: Not on file   Years of education: Not on file   Highest education level: Not on file  Occupational History   Not on file  Tobacco Use   Smoking status: Former    Packs/day: 1.00    Years: 10.00    Total pack years: 10.00    Types: Cigarettes    Quit date: 08/15/1977    Years since quitting: 45.0   Smokeless tobacco: Never  Vaping Use   Vaping Use: Never used  Substance and Sexual Activity   Alcohol use: Yes    Alcohol/week: 7.0 standard drinks of alcohol    Types: 7 Shots of liquor per week   Drug use: No   Sexual activity: Yes  Other Topics Concern   Not on file  Social History Narrative   Not on file   Social Determinants of Health   Financial Resource Strain: Low Risk  (11/24/2021)   Overall Financial Resource Strain (CARDIA)    Difficulty of Paying Living Expenses: Not hard at all  Food Insecurity: No Food Insecurity (11/24/2021)   Hunger Vital Sign    Worried About Running Out of Food in the Last Year: Never true    Ran Out of Food in the Last Year: Never true  Transportation Needs: No Transportation Needs (11/24/2021)   PRAPARE - Hydrologist (Medical): No    Lack of Transportation (Non-Medical): No  Physical Activity: Sufficiently Active (11/24/2021)   Exercise Vital Sign    Days of Exercise per Week: 5 days    Minutes of  Exercise per Session: 60 min  Stress: No Stress Concern Present (11/24/2021)   [place]    Feeling of Stress : Not at all  Social Connections: Moderately Isolated (11/24/2021)   Social Connection and Isolation Panel [NHANES]    Frequency of Communication with Friends and Family: More than three times a week    Frequency of Social Gatherings with Friends and Family: More than three times a week    Attends Religious Services: Never    Marine scientist or Organizations: No    Attends Archivist Meetings:  Never    Marital Status: Married  Human resources officer Violence: Not At Risk (11/24/2021)   Humiliation, Afraid, Rape, and Kick questionnaire    Fear of Current or Ex-Partner: No    Emotionally Abused: No    Physically Abused: No    Sexually Abused: No    Family History  Adopted: Yes  Family history unknown: Yes    ROS- All systems are reviewed and negative except as per the HPI above  Physical Exam: Vitals:   08/29/22 0903  Weight: 104.4 kg  Height: 5' 10.5" (1.791 m)   Wt Readings from Last 3 Encounters:  08/29/22 104.4 kg  07/15/22 98.4 kg  05/18/22 98.4 kg    Labs: Lab Results  Component Value Date   NA 141 09/07/2021   K 4.6 09/07/2021   CL 105 09/07/2021   CO2 25 09/07/2021   GLUCOSE 107 (H) 09/07/2021   BUN 17 09/07/2021   CREATININE 0.89 09/07/2021   CALCIUM 9.0 09/07/2021   Lab Results  Component Value Date   INR 1.11 10/10/2014   Lab Results  Component Value Date   CHOL 133 11/16/2021   HDL 44.90 11/16/2021   LDLCALC 75 11/16/2021   TRIG 66.0 11/16/2021     GEN- The patient is well appearing, alert and oriented x 3 today.   Head- normocephalic, atraumatic Eyes-  Sclera clear, conjunctiva pink Ears- hearing intact Oropharynx- clear Neck- supple, no JVP Lymph- no cervical lymphadenopathy Lungs- Clear to ausculation bilaterally, normal work of breathing Heart-  irregular rate and rhythm, no murmurs, rubs or gallops, PMI not laterally displaced GI- soft, NT, ND, + BS Extremities- no clubbing, cyanosis, or edema MS- no significant deformity or atrophy Skin- no rash or lesion Psych- euthymic mood, full affect Neuro- strength and sensation are intact  EKG-Vent. rate 88 BPM PR interval * ms QRS duration 110 ms QT/QTcB 352/425 ms P-R-T axes * -54 42 Atrial fibrillation Left anterior fascicular block Minimal voltage criteria for LVH, may be normal variant ( R in aVL ) Abnormal ECG When compared with ECG of 15-Oct-2020 09:06, PREVIOUS ECG IS PRESENT    Assessment and Plan:  1. Persistent Afib  Persistent since 07/19/22. He had the flu shortly before this which may have been the trigger. He has tolerated fairly well but pt has noted he has been more short of breath and some fatigue  We discussed cardioversion, risk vrs benefit and they want to proceed Wife  was under the impression that pt had syncope in the past due to post conversion pauses, but Dr. Lovena Le in his note indicated neurally mediated syncope. He will continue metoprolol succinate 25 mg qd  2. CHA2DS2VASc score of at least 5 No missed doses of xarelto 20 mg daily and reminded no to miss any doses with pending cardioversion   DCCV scheduled for 1/22 at 11 am Cbc/bmet today   08/31/22- 1:17 pm- Chart reviewed by Dr. Lovena Le and he agrees to proceed with cardioversion.   Geroge Baseman Litzi Binning, [facility] [address] [place], [place] [address] [phone]

## 2022-08-29 NOTE — Patient Instructions (Signed)
Cardioversion scheduled for: Monday, January 22nd   - Arrive at the [facility] and go to admitting at [facility] not eat or drink anything after midnight the night prior to your procedure.   - Take all your morning medication (except diabetic medications) with a sip of water prior to arrival.  - You will not be able to drive home after your procedure.    - Do NOT miss any doses of your blood thinner - if you should miss a dose please notify our office immediately.   - If you feel as if you go back into normal rhythm prior to scheduled cardioversion, please notify our office immediately.   If your procedure is canceled in the cardioversion suite you will be charged a cancellation fee.

## 2022-08-31 ENCOUNTER — Encounter (HOSPITAL_COMMUNITY): Payer: Self-pay | Admitting: Nurse Practitioner

## 2022-08-31 NOTE — Addendum Note (Signed)
Encounter addended by: Sherran Needs, NP on: 08/31/2022 1:18 PM  Actions taken: Clinical Note Signed, Flowsheet accepted, Medication List reviewed, Problem List reviewed, Allergies reviewed

## 2022-09-05 ENCOUNTER — Encounter (HOSPITAL_COMMUNITY): Payer: Self-pay | Admitting: Cardiology

## 2022-09-05 ENCOUNTER — Ambulatory Visit (HOSPITAL_BASED_OUTPATIENT_CLINIC_OR_DEPARTMENT_OTHER): Payer: Medicare Other | Admitting: Anesthesiology

## 2022-09-05 ENCOUNTER — Ambulatory Visit (HOSPITAL_COMMUNITY): Payer: Medicare Other | Admitting: Anesthesiology

## 2022-09-05 ENCOUNTER — Other Ambulatory Visit: Payer: Self-pay

## 2022-09-05 ENCOUNTER — Encounter (HOSPITAL_COMMUNITY)
Admission: RE | Disposition: A | Discharge status: Home / Self Care | Payer: Self-pay | Source: Home / Self Care | Attending: Cardiology

## 2022-09-05 ENCOUNTER — Ambulatory Visit (HOSPITAL_COMMUNITY)
Admission: RE | Admit: 2022-09-05 | Discharge: 2022-09-05 | Disposition: A | Discharge status: Home / Self Care | Payer: Medicare Other | Attending: Cardiology | Admitting: Cardiology

## 2022-09-05 DIAGNOSIS — I2510 Atherosclerotic heart disease of native coronary artery without angina pectoris: Secondary | ICD-10-CM | POA: Diagnosis not present

## 2022-09-05 DIAGNOSIS — I2699 Other pulmonary embolism without acute cor pulmonale: Secondary | ICD-10-CM

## 2022-09-05 DIAGNOSIS — Z86711 Personal history of pulmonary embolism: Secondary | ICD-10-CM | POA: Diagnosis not present

## 2022-09-05 DIAGNOSIS — Z87891 Personal history of nicotine dependence: Secondary | ICD-10-CM | POA: Diagnosis not present

## 2022-09-05 DIAGNOSIS — I4891 Unspecified atrial fibrillation: Secondary | ICD-10-CM | POA: Diagnosis not present

## 2022-09-05 DIAGNOSIS — Z86718 Personal history of other venous thrombosis and embolism: Secondary | ICD-10-CM | POA: Insufficient documentation

## 2022-09-05 DIAGNOSIS — I444 Left anterior fascicular block: Secondary | ICD-10-CM | POA: Diagnosis not present

## 2022-09-05 DIAGNOSIS — Z7901 Long term (current) use of anticoagulants: Secondary | ICD-10-CM | POA: Diagnosis not present

## 2022-09-05 DIAGNOSIS — M1990 Unspecified osteoarthritis, unspecified site: Secondary | ICD-10-CM | POA: Insufficient documentation

## 2022-09-05 DIAGNOSIS — I4819 Other persistent atrial fibrillation: Secondary | ICD-10-CM | POA: Diagnosis not present

## 2022-09-05 DIAGNOSIS — Z79899 Other long term (current) drug therapy: Secondary | ICD-10-CM | POA: Insufficient documentation

## 2022-09-05 DIAGNOSIS — I10 Essential (primary) hypertension: Secondary | ICD-10-CM | POA: Diagnosis not present

## 2022-09-05 HISTORY — PX: CARDIOVERSION: SHX1299

## 2022-09-05 SURGERY — CARDIOVERSION
Anesthesia: General

## 2022-09-05 MED ORDER — PROPOFOL 10 MG/ML IV BOLUS
INTRAVENOUS | Status: DC | PRN
  Administered 2022-09-05: 50 mg via INTRAVENOUS

## 2022-09-05 MED ORDER — SODIUM CHLORIDE 0.9 % IV SOLN: INTRAVENOUS | Status: DC

## 2022-09-05 NOTE — Discharge Instructions (Signed)

## 2022-09-05 NOTE — CV Procedure (Signed)
   Electrical Cardioversion Procedure Note Joe Davis [id number] 12/12/1945  Procedure: Electrical Cardioversion Indications:  Atrial Fibrillation  Time Out: Verified patient identification, verified procedure,medications/allergies/relevent history reviewed, required imaging and test results available.  Performed  Procedure Details  The patient signed informed consent.   The patient was NPO past midnight. Has had therapeutic anticoagulation with Eliquis greater than 3 weeks. The patient denies any interruption of anticoagulation.  Anesthesia was administered by Dr. Lanetta Inch.  Adequate airway was maintained throughout and vital followed per protocol.  He was cardioverted x 1 with 200J of biphasic synchronized energy.  He converted to NSR.  There were no apparent complications.  The patient tolerated the procedure well and had normal neuro status and respiratory status post procedure with vitals stable as recorded elsewhere.     IMPRESSION:  Successful cardioversion of atrial fibrillation to sinus rhythm - no post conversion pauses were noted.   Follow up:  We will arrange follow up with primary cardiologist.  He will continue on current medical therapy.  The patient advised to continue anticoagulation.  Joe Davis 09/05/2022, 11:29 AM

## 2022-09-05 NOTE — Transfer of Care (Signed)
Immediate Anesthesia Transfer of Care Note  Patient: Joe Davis  Procedure(s) Performed: CARDIOVERSION  Patient Location: PACU and Endoscopy Unit  Anesthesia Type:General  Level of Consciousness: drowsy and patient cooperative  Airway & Oxygen Therapy: Patient Spontanous Breathing and Patient connected to nasal cannula oxygen  Post-op Assessment: Report given to RN and Post -op Vital signs reviewed and stable  Post vital signs: Reviewed and stable  Last Vitals:  Vitals Value Taken Time  BP    Temp    Pulse    Resp    SpO2      Last Pain:  Vitals:   09/05/22 1051  TempSrc: Temporal  PainSc: 0-No pain         Complications: No notable events documented.

## 2022-09-05 NOTE — Anesthesia Preprocedure Evaluation (Addendum)
Anesthesia Evaluation  Patient identified by MRN, date of birth, ID band Patient awake    Reviewed: Allergy & Precautions, NPO status , Patient's Chart, lab work & pertinent test results  Airway Mallampati: III  TM Distance: >3 FB Neck ROM: Full    Dental no notable dental hx. (+) Teeth Intact, Dental Advisory Given   Pulmonary former smoker, PE   Pulmonary exam normal breath sounds clear to auscultation       Cardiovascular + CAD and + DVT  Normal cardiovascular exam+ dysrhythmias (xarelto) Atrial Fibrillation  Rhythm:Irregular Rate:Normal  TTE 2021 1. Left ventricular ejection fraction, by estimation, is 55 to 60%. The  left ventricle has normal function. The left ventrical is not well  visualized. There is mildly increased left ventricular hypertrophy. Left  ventricular diastolic parameters are  consistent with Grade I diastolic dysfunction (impaired relaxation).   2. Right ventricular systolic function is normal. The right ventricular  size is normal. There is normal pulmonary artery systolic pressure.   3. No left atrial/left atrial appendage thrombus was detected.   4. The mitral valve is normal in structure and function. no evidence of  mitral valve regurgitation. No evidence of mitral stenosis.   5. The aortic valve was not well visualized. Aortic valve regurgitation  is trivial . Mild aortic valve sclerosis is present, with no evidence of  aortic valve stenosis.   6. The inferior vena cava is normal in size with greater than 50%  respiratory variability, suggesting right atrial pressure of 3 mmHg.   Stress Test 2023   The study is normal. The study is low risk.   ECG rhythm shows sinus bradycardia. ECG demonstrates left anterior fascicular block. There is left ventricular hypertrophy without strain.   No ST deviation was noted. There were no arrhythmias during stress. There were no arrhythmias during recovery.   LV  perfusion is normal. There is no evidence of ischemia. There is no evidence of infarction.   Left ventricular function is normal. Nuclear stress EF: 60 %. The left ventricular ejection fraction is normal (55-65%). End diastolic cavity size is mildly enlarged. End systolic cavity size is normal.   Prior study available for comparison from 01/08/2020.     Neuro/Psych negative neurological ROS  negative psych ROS   GI/Hepatic negative GI ROS, Neg liver ROS,,,  Endo/Other  negative endocrine ROS    Renal/GU negative Renal ROS  negative genitourinary   Musculoskeletal  (+) Arthritis ,    Abdominal   Peds  Hematology negative hematology ROS (+)   Anesthesia Other Findings   Reproductive/Obstetrics                             Anesthesia Physical Anesthesia Plan  ASA: 3  Anesthesia Plan: General   Post-op Pain Management:    Induction: Intravenous  PONV Risk Score and Plan: Propofol infusion and Treatment may vary due to age or medical condition  Airway Management Planned: Natural Airway  Additional Equipment:   Intra-op Plan:   Post-operative Plan:   Informed Consent: I have reviewed the patients History and Physical, chart, labs and discussed the procedure including the risks, benefits and alternatives for the proposed anesthesia with the patient or authorized representative who has indicated his/her understanding and acceptance.     Dental advisory given  Plan Discussed with: CRNA  Anesthesia Plan Comments:        Anesthesia Quick Evaluation

## 2022-09-05 NOTE — Interval H&P Note (Signed)
History and Physical Interval Note:  09/05/2022 10:51 AM  Joe Davis  has presented today for surgery, with the diagnosis of AFIB.  The various methods of treatment have been discussed with the patient and family. After consideration of risks, benefits and other options for treatment, the patient has consented to  Procedure(s): CARDIOVERSION (N/A) as a surgical intervention.  The patient's history has been reviewed, patient examined, no change in status, stable for surgery.  I have reviewed the patient's chart and labs.  Questions were answered to the patient's satisfaction.     Ahilyn Nell

## 2022-09-06 ENCOUNTER — Encounter (HOSPITAL_COMMUNITY): Payer: Self-pay | Admitting: Cardiology

## 2022-09-06 NOTE — Anesthesia Postprocedure Evaluation (Signed)
Anesthesia Post Note  Patient: KAEVION SINCLAIR  Procedure(s) Performed: CARDIOVERSION     Patient location during evaluation: Endoscopy Anesthesia Type: General Level of consciousness: awake and alert Pain management: pain level controlled Vital Signs Assessment: post-procedure vital signs reviewed and stable Respiratory status: spontaneous breathing, nonlabored ventilation, respiratory function stable and patient connected to nasal cannula oxygen Cardiovascular status: blood pressure returned to baseline and stable Postop Assessment: no apparent nausea or vomiting Anesthetic complications: no  No notable events documented.  Last Vitals:  Vitals:   09/05/22 1140 09/05/22 1150  BP: 112/67 124/75  Pulse: 60 (!) 53  Resp: 17 20  Temp:    SpO2: 96% 95%    Last Pain:  Vitals:   09/05/22 1150  TempSrc:   PainSc: 0-No pain                 Buford Gayler L Stevin Bielinski

## 2022-09-09 ENCOUNTER — Other Ambulatory Visit: Payer: Self-pay | Admitting: Family Medicine

## 2022-09-12 ENCOUNTER — Telehealth (HOSPITAL_COMMUNITY): Payer: Self-pay

## 2022-09-12 NOTE — Telephone Encounter (Signed)
Patient's wife called in regarding A-fib. He went back into A-fib on Saturday. This morning HR is 70-85, B/P 139/95. He has no symptoms. Per Adline Peals PA- schedule appointment next week to see Roderic Palau - NP. Appointment scheduled on 2/6 @ 2:00pm to see Roderic Palau- NP. Consulted with patient's wife and she verbalized understanding.

## 2022-09-20 ENCOUNTER — Encounter (HOSPITAL_COMMUNITY): Payer: Self-pay | Admitting: Nurse Practitioner

## 2022-09-20 ENCOUNTER — Ambulatory Visit (HOSPITAL_COMMUNITY)
Admission: RE | Admit: 2022-09-20 | Discharge: 2022-09-20 | Disposition: A | Discharge status: Home / Self Care | Payer: Medicare Other | Source: Ambulatory Visit | Attending: Nurse Practitioner | Admitting: Nurse Practitioner

## 2022-09-20 VITALS — BP 152/100 | HR 97 | Ht 70.5 in | Wt 232.0 lb

## 2022-09-20 DIAGNOSIS — I2510 Atherosclerotic heart disease of native coronary artery without angina pectoris: Secondary | ICD-10-CM | POA: Insufficient documentation

## 2022-09-20 DIAGNOSIS — Z7901 Long term (current) use of anticoagulants: Secondary | ICD-10-CM | POA: Diagnosis not present

## 2022-09-20 DIAGNOSIS — I82409 Acute embolism and thrombosis of unspecified deep veins of unspecified lower extremity: Secondary | ICD-10-CM | POA: Insufficient documentation

## 2022-09-20 DIAGNOSIS — I4819 Other persistent atrial fibrillation: Secondary | ICD-10-CM

## 2022-09-20 DIAGNOSIS — D6869 Other thrombophilia: Secondary | ICD-10-CM | POA: Diagnosis not present

## 2022-09-20 DIAGNOSIS — I480 Paroxysmal atrial fibrillation: Secondary | ICD-10-CM | POA: Diagnosis not present

## 2022-09-20 DIAGNOSIS — I10 Essential (primary) hypertension: Secondary | ICD-10-CM | POA: Diagnosis not present

## 2022-09-20 MED ORDER — METOPROLOL SUCCINATE ER 25 MG PO TB24: ORAL_TABLET | ORAL | 3 refills | Status: DC

## 2022-09-20 NOTE — Patient Instructions (Signed)
Metoprolol '25mg'$  every morning and 12.'5mg'$  every evening

## 2022-09-20 NOTE — Progress Notes (Signed)
Primary Care Physician: [name], MD Referring Physician: 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 EKG today shows afib rate controlled. Appears to be persistent since 07/19/22. He is tolerating well, with minimal symptoms. He is on xarelto for a prior DVT/PE as well as CHA2DS2VASc score of at least 5, no missed doses.  He has a h/o of prior syncope for which he received a loop back in 2021. HIs wife, a prior anesthetist, has the understanding it was possibly from  post afib termination pauses. She is concerned re pt undergoing a cardioversion. However, Dr. [name] note said his symptoms were fairly typcial for neurally mediated syncope. After exploring risk vrs benefit, pt and wife want to pursue DCCV, as pt has noted some mild dyspnea and fatigue. He has been fairly well rate controlled.   F/u in afib clinic, 09/20/22, f/u successful cardioversion. Unfortunately, he had ERAF, with only maintaining  SR for around 5 days. He feels tired in afib.   Today, he denies symptoms of palpitations, chest pain, shortness of breath, orthopnea, PND, lower extremity edema, dizziness, presyncope, syncope, or neurologic sequela. The patient is tolerating medications without difficulties and is otherwise without complaint today.   Past Medical History:  Diagnosis Date   Arthritis    "knees, hips" (10/10/2014)   Basal cell carcinoma    BPH (benign prostatic hyperplasia)    Bradycardia vasovagal response 10 yrs ago   asymptomatic   Complication of anesthesia    bradycardia with spinal   Coronary artery disease    Diastolic dysfunction    Per echo in 2008   DVT (deep venous thrombosis) ([place]) 08/15/1977   RLE   Erectile dysfunction    History of gout    History of nuclear stress test    ETT-Myoview 3/17: Hypertensive  blood pressure response to exercise, normal perfusion, EF 60%, low risk study   Hypercholesterolemia    ? statin intolerant. May tolerate Lipitor   Normal nuclear stress test 12/13/2009   Obesity    Pulmonary embolism ([place]) 08/15/1977   Syncope and collapse 8-10 yrs ago   Past Surgical History:  Procedure Laterality Date   CARDIOVASCULAR STRESS TEST  12/13/2009   EF 73%; No ischemia   CARDIOVERSION N/A 09/05/2022   Procedure: CARDIOVERSION;  Surgeon: Berniece Salines, DO;  Location: [facility];  Service: Cardiovascular;  Laterality: N/A;   COLONOSCOPY  08/16/2011   COLONOSCOPY WITH PROPOFOL N/A 05/18/2022   Procedure: COLONOSCOPY WITH PROPOFOL;  Surgeon: Arta Silence, MD;  Location: [facility];  Service: Gastroenterology;  Laterality: N/A;   IVC FILTER INSERTION N/A 10/06/2020   Procedure: IVC FILTER INSERTION;  Surgeon: Serafina Mitchell, MD;  Location: [facility];  Service: Cardiovascular;  Laterality: N/A;  w/ IVC Venogram   IVC FILTER REMOVAL N/A 01/05/2021   Procedure: IVC FILTER REMOVAL;  Surgeon: Serafina Mitchell, MD;  Location: [facility];  Service: Cardiovascular;  Laterality: N/A;   JOINT REPLACEMENT     KNEE ARTHROSCOPY Right 10/03/2012   Procedure: RIGHT KNEE ARTHROSCOPY WITH DEBRIDEMENT;  Surgeon: [name], MD;  Location: [facility];  Service: Orthopedics;  Laterality: Right;  WITH DEBRIDEMENT   LOOP RECORDER IMPLANT Left    MOHS SURGERY  6 yrs  nose   POLYPECTOMY  05/18/2022   Procedure: POLYPECTOMY;  Surgeon: Arta Silence, MD;  Location: [facility];  Service: Gastroenterology;;   SKIN CANCER EXCISION     left upper arm, biopsy   TOTAL HIP ARTHROPLASTY Right 10/06/2014    Current Outpatient Medications  Medication Sig Dispense Refill   acetaminophen (TYLENOL) 500 MG tablet Take 500 mg by mouth every 8 (eight) hours as needed for mild pain or headache.     albuterol (VENTOLIN HFA) 108 (90 Base) MCG/ACT inhaler INHALE 2 PUFFS INTO THE LUNGS  EVERY 6 HOURS AS NEEDED FOR WHEEZING OR SHORTNESS OF BREATH 18 g 0   Colchicine 0.6 MG CAPS TAKE 1 CAPSULE BY MOUTH DAILY (Patient taking differently: Take 0.6 mg by mouth as needed (gout flares).) 30 capsule 2   rivaroxaban (XARELTO) 20 MG TABS tablet TAKE 1 TABLET EVERY DAY WITH SUPPER 90 tablet 5   rosuvastatin (CRESTOR) 10 MG tablet TAKE 1 TABLET(10 MG) BY MOUTH DAILY (Patient taking differently: Take 10 mg by mouth every evening.) 30 tablet 11   tadalafil (CIALIS) 5 MG tablet Take 5 mg by mouth daily as needed for erectile dysfunction.  10   metoprolol succinate (TOPROL-XL) 25 MG 24 hr tablet Take 1 tablet (25 mg total) by mouth every morning AND 0.5 tablets (12.5 mg total) at bedtime. 180 tablet 3   No current facility-administered medications for this encounter.    No Known Allergies  Social History   Socioeconomic History   Marital status: Married    Spouse name: Not on file   Number of children: Not on file   Years of education: Not on file   Highest education level: Not on file  Occupational History   Not on file  Tobacco Use   Smoking status: Former    Packs/day: 1.00    Years: 10.00    Total pack years: 10.00    Types: Cigarettes    Quit date: 08/15/1977    Years since quitting: 45.1   Smokeless tobacco: Never  Vaping Use   Vaping Use: Never used  Substance and Sexual Activity   Alcohol use: Yes    Alcohol/week: 7.0 standard drinks of alcohol    Types: 7 Shots of liquor per week   Drug use: No   Sexual activity: Yes  Other Topics Concern   Not on file  Social History Narrative   Not on file   Social Determinants of Health   Financial Resource Strain: Low Risk  (11/24/2021)   Overall Financial Resource Strain (CARDIA)    Difficulty of Paying Living Expenses: Not hard at all  Food Insecurity: No Food Insecurity (11/24/2021)   Hunger Vital Sign    Worried About Running Out of Food in the Last Year: Never true    Ran Out of Food in the Last Year: Never true   Transportation Needs: No Transportation Needs (11/24/2021)   PRAPARE - Hydrologist (Medical): No    Lack of Transportation (Non-Medical): No  Physical Activity: Sufficiently Active (11/24/2021)   Exercise Vital Sign    Days of Exercise per Week: 5 days    Minutes of Exercise per Session: 60 min  Stress: No Stress Concern Present (11/24/2021)   [place]    Feeling of Stress : Not at all  Social Connections: Moderately Isolated (11/24/2021)   Social Connection and Isolation Panel [NHANES]    Frequency of Communication with Friends and Family: More  than three times a week    Frequency of Social Gatherings with Friends and Family: More than three times a week    Attends Religious Services: Never    Marine scientist or Organizations: No    Attends Archivist Meetings: Never    Marital Status: Married  Human resources officer Violence: Not At Risk (11/24/2021)   Humiliation, Afraid, Rape, and Kick questionnaire    Fear of Current or Ex-Partner: No    Emotionally Abused: No    Physically Abused: No    Sexually Abused: No    Family History  Adopted: Yes  Family history unknown: Yes    ROS- All systems are reviewed and negative except as per the HPI above  Physical Exam: Vitals:   09/20/22 1354  BP: (!) 152/100  Pulse: 97  Weight: 105.2 kg  Height: 5' 10.5" (1.791 m)   Wt Readings from Last 3 Encounters:  09/20/22 105.2 kg  09/05/22 98.4 kg  08/29/22 104.4 kg    Labs: Lab Results  Component Value Date   NA 137 08/29/2022   K 4.1 08/29/2022   CL 108 08/29/2022   CO2 20 (L) 08/29/2022   GLUCOSE 110 (H) 08/29/2022   BUN 18 08/29/2022   CREATININE 0.85 08/29/2022   CALCIUM 8.6 (L) 08/29/2022   Lab Results  Component Value Date   INR 1.11 10/10/2014   Lab Results  Component Value Date   CHOL 133 11/16/2021   HDL 44.90 11/16/2021   LDLCALC 75 11/16/2021   TRIG  66.0 11/16/2021     GEN- The patient is well appearing, alert and oriented x 3 today.   Head- normocephalic, atraumatic Eyes-  Sclera clear, conjunctiva pink Ears- hearing intact Oropharynx- clear Neck- supple, no JVP Lymph- no cervical lymphadenopathy Lungs- Clear to ausculation bilaterally, normal work of breathing Heart- irregular rate and rhythm, no murmurs, rubs or gallops, PMI not laterally displaced GI- soft, NT, ND, + BS Extremities- no clubbing, cyanosis, or edema MS- no significant deformity or atrophy Skin- no rash or lesion Psych- euthymic mood, full affect Neuro- strength and sensation are intact  EKG- Vent. rate 97 BPM PR interval * ms QRS duration 98 ms QT/QTcB 352/447 ms P-R-T axes * -46 30 Atrial fibrillation Left anterior fascicular block Minimal voltage criteria for LVH, may be normal variant ( R in aVL ) Abnormal ECG When compared with ECG of 05-Sep-2022 11:35,, Afib has replaced sinus rhythm Confirmed by Virgina Jock, Manish ([id number]) on 09/20/2022 2:31:05 PM   Assessment and Plan:  1. Persistent Afib  Became persistent  07/19/22. He had the flu shortly before this which may have been the trigger. He has tolerated fairly well but pt has noted he has been more short of breath and some fatigue  We discussed cardioversion, risk vrs benefit and they want to proceed He had a successful cardioversion 09/05/22 but with ERAF  I discussed options of amiodarone or Tikosyn,  1c agents not discussed with h/o CAD. I also discussed pursuing  ablation, he would like to see EP to discuss  I will change metoprolol succinate 25 mg qd to the am and add 12.5 mg in the pm for better rate control and refer to EP   2. CHA2DS2VASc score of at least 5 No missed doses of xarelto 20 mg daily   To EP to discuss ablation His wife will monitor HR/BP at home with increase of BB   Joe Davis, [facility] [address]  [address] [place], [place]  [address] [phone]

## 2022-09-22 ENCOUNTER — Encounter (HOSPITAL_COMMUNITY): Payer: Self-pay | Admitting: *Deleted

## 2022-09-22 DIAGNOSIS — C44619 Basal cell carcinoma of skin of left upper limb, including shoulder: Secondary | ICD-10-CM | POA: Diagnosis not present

## 2022-09-26 ENCOUNTER — Ambulatory Visit: Payer: Medicare Other

## 2022-09-26 DIAGNOSIS — I480 Paroxysmal atrial fibrillation: Secondary | ICD-10-CM | POA: Diagnosis not present

## 2022-09-27 LAB — CUP PACEART REMOTE DEVICE CHECK
Date Time Interrogation Session: 20240212103719
Implantable Pulse Generator Implant Date: 20220607

## 2022-09-27 NOTE — Progress Notes (Signed)
Carelink Summary Report / Loop Recorder 

## 2022-10-08 DIAGNOSIS — Z4802 Encounter for removal of sutures: Secondary | ICD-10-CM | POA: Diagnosis not present

## 2022-10-12 ENCOUNTER — Ambulatory Visit: Payer: Medicare Other | Attending: Cardiology | Admitting: Cardiology

## 2022-10-12 ENCOUNTER — Encounter: Payer: Self-pay | Admitting: Cardiology

## 2022-10-12 VITALS — BP 132/70 | HR 98 | Ht 70.5 in | Wt 230.4 lb

## 2022-10-12 DIAGNOSIS — I2510 Atherosclerotic heart disease of native coronary artery without angina pectoris: Secondary | ICD-10-CM | POA: Insufficient documentation

## 2022-10-12 DIAGNOSIS — I4819 Other persistent atrial fibrillation: Secondary | ICD-10-CM | POA: Diagnosis not present

## 2022-10-12 NOTE — Patient Instructions (Addendum)
Medication Instructions:  Your physician recommends that you continue on your current medications as directed. Please refer to the Current Medication list given to you today.  *If you need a refill on your cardiac medications before your next appointment, please call your pharmacy*  Lab Work: BMET and CBC on June 17th between 7:15am and 5:00pm at [address], [place], [place]. You DO NOT need to be fasting.  Testing/Procedures: Your physician has requested that you have cardiac CT. Cardiac computed tomography (CT) is a painless test that uses an x-ray machine to take clear, detailed pictures of your heart. For further information please visit [employer]. Please follow instruction sheet as given. We will call you to schedule you CT scan. It will be done about one week prior to you ablation.  Your physician has recommended that you have an ablation. Catheter ablation is a medical procedure used to treat some cardiac arrhythmias (irregular heartbeats). During catheter ablation, a long, thin, flexible tube is put into a blood vessel in your groin (upper thigh), or neck. This tube is called an ablation catheter. It is then guided to your heart through the blood vessel. Radio frequency waves destroy small areas of heart tissue where abnormal heartbeats may cause an arrhythmia to start. Please see the instruction sheet given to you today. You are scheduled for Atrial Fibrillation Ablation on Monday, July 1 with Dr. Lars Mage at 10:30AM. Please arrive at the Main Entrance A at [facility]: [place], [place] [address] at 8:30 AM   Follow-Up: At [facility], you and your health needs are our priority.  As part of our continuing mission to provide you with exceptional heart care, we have created designated Provider Care Teams.  These Care Teams include your primary Cardiologist (physician) and Advanced Practice Providers (APPs -  Physician Assistants and  Nurse Practitioners) who all work together to provide you with the care you need, when you need it.  Your next appointment:   We will call you to arrange follow up

## 2022-10-12 NOTE — Progress Notes (Signed)
Electrophysiology Office Note:    Date:  10/12/2022   ID:  [name], DOB [birth year], MRN [id number]  [facility] Cardiologist:  Joe Rouge, MD  [place] Electrophysiologist:  None   Referring MD: Joe Needs, NP   Chief Complaint: Atrial fibrillation  History of Present Illness:    Joe Davis is a 77 y.o. male who I am seeing today for an opinion regarding atrial fibrillation at the request of Joe Davis.  The patient has a medical history that includes DVT/PE, coronary artery disease, hypertension.  He had a cardioversion on September 05, 2022 and maintained sinus rhythm for about 5 days.  He is symptomatic with fatigue while in atrial fibrillation.  Based on his loop recorder monitor, his atrial fibrillation became persistent July 19, 2022.  Potentially made worse by influenza.  Ablation, amiodarone and Tikosyn were discussed at the appointment with Joe Davis and the patient wished to meet with me today to discuss possible catheter ablation.  He is on Xarelto for stroke prophylaxis.  He is an active man and likes to garden.  When in atrial fibrillation, he is fatigued and has a reduced exercise tolerance.  For the 5 days he was in normal rhythm he felt much better.  He is with his wife today in clinic.        Their past medical, social and family history was reveiwed.   ROS:   Please see the history of present illness.    All other systems reviewed and are negative.  EKGs/Labs/Other Studies Reviewed:    The following studies were reviewed today:  September 26, 2022 loop recorder interrogation personally reviewed      Physical Exam:    VS:  BP 132/70   Pulse 98   Ht 5' 10.5" (1.791 m)   Wt 230 lb 6.4 oz (104.5 kg)   SpO2 97%   BMI 32.59 kg/m     Wt Readings from Last 3 Encounters:  10/12/22 230 lb 6.4 oz (104.5 kg)  09/20/22 232 lb (105.2 kg)  09/05/22 217 lb (98.4 kg)     GEN:  Well nourished, well developed in no acute  distress CARDIAC: Irregularly irregular, no murmurs, rubs, gallops RESPIRATORY:  Clear to auscultation without rales, wheezing or rhonchi       ASSESSMENT AND PLAN:    1. Persistent atrial fibrillation (Joe Davis)   2. Coronary artery disease involving native coronary artery of native heart without angina pectoris     #Persistent atrial fibrillation Symptomatic On Xarelto for stroke prophylaxis  Discussed treatment options today for their AF including antiarrhythmic drug therapy and ablation. Discussed risks, recovery and likelihood of success. Discussed potential need for repeat ablation procedures and antiarrhythmic drugs after an initial ablation. They wish to proceed with scheduling.  Risk, benefits, and alternatives to EP study and radiofrequency ablation for afib were also discussed in detail today. These risks include but are not limited to stroke, bleeding, vascular damage, tamponade, perforation, damage to the esophagus, lungs, and other structures, pulmonary vein stenosis, worsening renal function, and death. The patient understands these risk and wishes to proceed.  We will therefore proceed with catheter ablation at the next available time.  Carto, ICE, anesthesia are requested for the procedure.  Will also obtain CT PV protocol prior to the procedure to exclude LAA thrombus and further evaluate atrial anatomy.  #Coronary artery disease No ischemic symptoms today I have encouraged him to take his statin daily.  If he tolerates daily dosing of  his statin, plan to increase the dose to 20 mg by mouth once daily around the time of his ablation.       Signed, Hilton Cork. Quentin Ore, MD, [facility], [facility] 10/12/2022 4:08 PM    Electrophysiology [facility]

## 2022-10-13 DIAGNOSIS — D2270 Melanocytic nevi of unspecified lower limb, including hip: Secondary | ICD-10-CM | POA: Diagnosis not present

## 2022-10-13 DIAGNOSIS — Z1283 Encounter for screening for malignant neoplasm of skin: Secondary | ICD-10-CM | POA: Diagnosis not present

## 2022-10-13 DIAGNOSIS — L570 Actinic keratosis: Secondary | ICD-10-CM | POA: Diagnosis not present

## 2022-10-13 DIAGNOSIS — D225 Melanocytic nevi of trunk: Secondary | ICD-10-CM | POA: Diagnosis not present

## 2022-10-13 DIAGNOSIS — L258 Unspecified contact dermatitis due to other agents: Secondary | ICD-10-CM | POA: Diagnosis not present

## 2022-10-13 DIAGNOSIS — L821 Other seborrheic keratosis: Secondary | ICD-10-CM | POA: Diagnosis not present

## 2022-10-13 DIAGNOSIS — Z85828 Personal history of other malignant neoplasm of skin: Secondary | ICD-10-CM | POA: Diagnosis not present

## 2022-10-13 DIAGNOSIS — D2260 Melanocytic nevi of unspecified upper limb, including shoulder: Secondary | ICD-10-CM | POA: Diagnosis not present

## 2022-10-13 DIAGNOSIS — L940 Localized scleroderma [morphea]: Secondary | ICD-10-CM | POA: Diagnosis not present

## 2022-10-13 DIAGNOSIS — L578 Other skin changes due to chronic exposure to nonionizing radiation: Secondary | ICD-10-CM | POA: Diagnosis not present

## 2022-10-13 DIAGNOSIS — L814 Other melanin hyperpigmentation: Secondary | ICD-10-CM | POA: Diagnosis not present

## 2022-10-25 ENCOUNTER — Other Ambulatory Visit: Payer: Self-pay | Admitting: Cardiovascular Disease

## 2022-10-25 ENCOUNTER — Other Ambulatory Visit (HOSPITAL_COMMUNITY): Payer: Self-pay | Admitting: *Deleted

## 2022-10-25 MED ORDER — METOPROLOL SUCCINATE ER 25 MG PO TB24: 25.0000 mg | ORAL_TABLET | Freq: Every morning | ORAL | 3 refills | Status: DC

## 2022-10-27 ENCOUNTER — Encounter: Payer: Medicare Other | Admitting: Cardiovascular Disease

## 2022-10-29 DIAGNOSIS — M79672 Pain in left foot: Secondary | ICD-10-CM | POA: Diagnosis not present

## 2022-10-31 ENCOUNTER — Ambulatory Visit (INDEPENDENT_AMBULATORY_CARE_PROVIDER_SITE_OTHER): Payer: Medicare Other

## 2022-10-31 DIAGNOSIS — R55 Syncope and collapse: Secondary | ICD-10-CM

## 2022-11-01 LAB — CUP PACEART REMOTE DEVICE CHECK
Date Time Interrogation Session: 20240318082828
Implantable Pulse Generator Implant Date: 20220607

## 2022-11-02 DIAGNOSIS — R350 Frequency of micturition: Secondary | ICD-10-CM | POA: Diagnosis not present

## 2022-11-02 DIAGNOSIS — N5201 Erectile dysfunction due to arterial insufficiency: Secondary | ICD-10-CM | POA: Diagnosis not present

## 2022-11-02 DIAGNOSIS — N401 Enlarged prostate with lower urinary tract symptoms: Secondary | ICD-10-CM | POA: Diagnosis not present

## 2022-11-09 NOTE — Progress Notes (Signed)
Carelink Summary Report / Loop Recorder 

## 2022-11-17 ENCOUNTER — Telehealth: Payer: Self-pay | Admitting: Family Medicine

## 2022-11-17 NOTE — Telephone Encounter (Signed)
[place] to schedule their annual wellness visit. Appointment made for 11/29/22.  Barkley Boards AWV direct phone # [phone]   Spouse aware appt time has changed 4/16 @ 1040

## 2022-11-22 ENCOUNTER — Other Ambulatory Visit: Payer: Self-pay

## 2022-11-22 ENCOUNTER — Encounter: Payer: Medicare Other | Admitting: Cardiology

## 2022-11-22 ENCOUNTER — Ambulatory Visit: Payer: Medicare Other | Attending: Cardiology

## 2022-11-22 DIAGNOSIS — I4819 Other persistent atrial fibrillation: Secondary | ICD-10-CM

## 2022-11-22 LAB — CBC WITH DIFFERENTIAL/PLATELET
Hemoglobin: 13.8 g/dL (ref 13.0–17.7)
Lymphocytes Absolute: 1.4 10*3/uL (ref 0.7–3.1)
MCH: 28.4 pg (ref 26.6–33.0)
MCHC: 32.9 g/dL (ref 31.5–35.7)
MCV: 86 fL (ref 79–97)
RDW: 14 % (ref 11.6–15.4)
WBC: 6.4 10*3/uL (ref 3.4–10.8)

## 2022-11-22 LAB — BASIC METABOLIC PANEL

## 2022-11-23 ENCOUNTER — Telehealth (HOSPITAL_COMMUNITY): Payer: Self-pay | Admitting: *Deleted

## 2022-11-23 LAB — CBC WITH DIFFERENTIAL/PLATELET
Basophils Absolute: 0.1 10*3/uL (ref 0.0–0.2)
Basos: 1 %
EOS (ABSOLUTE): 0.1 10*3/uL (ref 0.0–0.4)
Eos: 2 %
Hematocrit: 41.9 % (ref 37.5–51.0)
Immature Grans (Abs): 0 10*3/uL (ref 0.0–0.1)
Immature Granulocytes: 0 %
Lymphs: 21 %
Monocytes Absolute: 0.9 10*3/uL (ref 0.1–0.9)
Monocytes: 13 %
Neutrophils Absolute: 4 10*3/uL (ref 1.4–7.0)
Neutrophils: 63 %
Platelets: 191 10*3/uL (ref 150–450)
RBC: 4.86 x10E6/uL (ref 4.14–5.80)

## 2022-11-23 LAB — BASIC METABOLIC PANEL
BUN/Creatinine Ratio: 21 (ref 10–24)
BUN: 17 mg/dL (ref 8–27)
CO2: 20 mmol/L (ref 20–29)
Calcium: 9 mg/dL (ref 8.6–10.2)
Chloride: 109 mmol/L — ABNORMAL HIGH (ref 96–106)
Creatinine, Ser: 0.82 mg/dL (ref 0.76–1.27)
Glucose: 84 mg/dL (ref 70–99)
eGFR: 90 mL/min/{1.73_m2} (ref >59)

## 2022-11-23 NOTE — Telephone Encounter (Signed)
Reaching out to patient to offer assistance regarding upcoming cardiac imaging study; pt verbalizes understanding of appt date/time, parking situation and where to check in, pre-test NPO status and verified current allergies; name and call back number provided for further questions should they arise ? ?Ai Sonnenfeld RN Navigator Cardiac Imaging ? Heart and Vascular ?[phone] office ?[phone] cell ? ?Patient aware to arrive at 1pm. ?

## 2022-11-24 ENCOUNTER — Ambulatory Visit (HOSPITAL_COMMUNITY)
Admission: RE | Admit: 2022-11-24 | Discharge: 2022-11-24 | Disposition: A | Discharge status: Home / Self Care | Payer: Medicare Other | Source: Ambulatory Visit | Attending: Cardiology | Admitting: Cardiology

## 2022-11-24 ENCOUNTER — Telehealth: Payer: Self-pay | Admitting: Family Medicine

## 2022-11-24 ENCOUNTER — Encounter (HOSPITAL_COMMUNITY): Payer: Self-pay

## 2022-11-24 DIAGNOSIS — I4819 Other persistent atrial fibrillation: Secondary | ICD-10-CM | POA: Diagnosis not present

## 2022-11-24 DIAGNOSIS — I2510 Atherosclerotic heart disease of native coronary artery without angina pectoris: Secondary | ICD-10-CM

## 2022-11-24 MED ORDER — IOHEXOL 350 MG/ML SOLN
100.0000 mL | Freq: Once | INTRAVENOUS | Status: AC | PRN
  Administered 2022-11-24: 100 mL via INTRAVENOUS

## 2022-11-24 NOTE — Telephone Encounter (Signed)
Wanted to make provider aware that patient is having a cardiac ablation on 11/29/22

## 2022-11-26 ENCOUNTER — Encounter: Payer: Self-pay | Admitting: Cardiology

## 2022-11-28 ENCOUNTER — Encounter: Payer: Self-pay | Admitting: Emergency Medicine

## 2022-11-28 NOTE — Pre-Procedure Instructions (Signed)
Spoke with patient's wife Chyrl Civatte.  Reviewed the following instructions with her.  Arrival time @ 0515 Nothing to eat after midnight. Don't take any medications in the morning.  Pt takes Xarelto- he hasn't missed any doses, he takes in the evening.

## 2022-11-29 ENCOUNTER — Encounter: Payer: Medicare Other | Admitting: Family Medicine

## 2022-11-29 ENCOUNTER — Ambulatory Visit (HOSPITAL_COMMUNITY)
Admission: RE | Admit: 2022-11-29 | Discharge: 2022-11-29 | Disposition: A | Discharge status: Home / Self Care | Payer: Medicare Other | Attending: Cardiology | Admitting: Cardiology

## 2022-11-29 ENCOUNTER — Other Ambulatory Visit: Payer: Self-pay

## 2022-11-29 ENCOUNTER — Ambulatory Visit (HOSPITAL_BASED_OUTPATIENT_CLINIC_OR_DEPARTMENT_OTHER): Payer: Medicare Other | Admitting: Certified Registered Nurse Anesthetist

## 2022-11-29 ENCOUNTER — Encounter (HOSPITAL_COMMUNITY): Payer: Self-pay | Admitting: Cardiology

## 2022-11-29 ENCOUNTER — Ambulatory Visit (HOSPITAL_COMMUNITY)
Admission: RE | Disposition: A | Discharge status: Home / Self Care | Payer: Self-pay | Source: Home / Self Care | Attending: Cardiology

## 2022-11-29 ENCOUNTER — Ambulatory Visit (HOSPITAL_COMMUNITY): Payer: Medicare Other | Admitting: Certified Registered Nurse Anesthetist

## 2022-11-29 ENCOUNTER — Other Ambulatory Visit (HOSPITAL_COMMUNITY): Payer: Self-pay

## 2022-11-29 DIAGNOSIS — I4891 Unspecified atrial fibrillation: Secondary | ICD-10-CM | POA: Diagnosis not present

## 2022-11-29 DIAGNOSIS — Z86718 Personal history of other venous thrombosis and embolism: Secondary | ICD-10-CM | POA: Insufficient documentation

## 2022-11-29 DIAGNOSIS — Z79899 Other long term (current) drug therapy: Secondary | ICD-10-CM | POA: Insufficient documentation

## 2022-11-29 DIAGNOSIS — I2510 Atherosclerotic heart disease of native coronary artery without angina pectoris: Secondary | ICD-10-CM | POA: Diagnosis not present

## 2022-11-29 DIAGNOSIS — I4819 Other persistent atrial fibrillation: Secondary | ICD-10-CM | POA: Diagnosis not present

## 2022-11-29 DIAGNOSIS — Z87891 Personal history of nicotine dependence: Secondary | ICD-10-CM | POA: Diagnosis not present

## 2022-11-29 DIAGNOSIS — Z7901 Long term (current) use of anticoagulants: Secondary | ICD-10-CM | POA: Diagnosis not present

## 2022-11-29 DIAGNOSIS — M1990 Unspecified osteoarthritis, unspecified site: Secondary | ICD-10-CM

## 2022-11-29 DIAGNOSIS — I10 Essential (primary) hypertension: Secondary | ICD-10-CM | POA: Insufficient documentation

## 2022-11-29 HISTORY — PX: ATRIAL FIBRILLATION ABLATION: EP1191

## 2022-11-29 LAB — POCT ACTIVATED CLOTTING TIME
Activated Clotting Time: 298 seconds
Activated Clotting Time: 342 seconds

## 2022-11-29 SURGERY — ATRIAL FIBRILLATION ABLATION
Anesthesia: General

## 2022-11-29 MED ORDER — ROCURONIUM BROMIDE 10 MG/ML (PF) SYRINGE
PREFILLED_SYRINGE | INTRAVENOUS | Status: DC | PRN
  Administered 2022-11-29: 70 mg via INTRAVENOUS
  Administered 2022-11-29 (×2): 10 mg via INTRAVENOUS

## 2022-11-29 MED ORDER — SODIUM CHLORIDE 0.9 % IV SOLN: INTRAVENOUS | Status: DC

## 2022-11-29 MED ORDER — EPHEDRINE SULFATE-NACL 50-0.9 MG/10ML-% IV SOSY
PREFILLED_SYRINGE | INTRAVENOUS | Status: DC | PRN
  Administered 2022-11-29: 2.5 mg via INTRAVENOUS

## 2022-11-29 MED ORDER — LIDOCAINE HCL (PF) 1 % IJ SOLN
INTRAMUSCULAR | Status: AC
  Filled 2022-11-29: qty 30

## 2022-11-29 MED ORDER — PROPOFOL 500 MG/50ML IV EMUL
INTRAVENOUS | Status: DC | PRN
  Administered 2022-11-29: 125 ug/kg/min via INTRAVENOUS

## 2022-11-29 MED ORDER — FENTANYL CITRATE (PF) 100 MCG/2ML IJ SOLN
INTRAMUSCULAR | Status: AC
  Filled 2022-11-29: qty 2

## 2022-11-29 MED ORDER — HEPARIN SODIUM (PORCINE) 1000 UNIT/ML IJ SOLN
INTRAMUSCULAR | Status: DC | PRN
  Administered 2022-11-29: 1000 [IU] via INTRAVENOUS

## 2022-11-29 MED ORDER — DEXAMETHASONE SODIUM PHOSPHATE 10 MG/ML IJ SOLN
INTRAMUSCULAR | Status: DC | PRN
  Administered 2022-11-29: 4 mg via INTRAVENOUS

## 2022-11-29 MED ORDER — SODIUM CHLORIDE 0.9% FLUSH: 3.0000 mL | Freq: Two times a day (BID) | INTRAVENOUS | Status: DC

## 2022-11-29 MED ORDER — PHENYLEPHRINE HCL-NACL 20-0.9 MG/250ML-% IV SOLN
INTRAVENOUS | Status: DC | PRN
  Administered 2022-11-29: 30 ug/min via INTRAVENOUS

## 2022-11-29 MED ORDER — ONDANSETRON HCL 4 MG/2ML IJ SOLN: 4.0000 mg | Freq: Four times a day (QID) | INTRAMUSCULAR | Status: DC | PRN

## 2022-11-29 MED ORDER — PHENYLEPHRINE 80 MCG/ML (10ML) SYRINGE FOR IV PUSH (FOR BLOOD PRESSURE SUPPORT)
PREFILLED_SYRINGE | INTRAVENOUS | Status: DC | PRN
  Administered 2022-11-29 (×2): 80 ug via INTRAVENOUS
  Administered 2022-11-29: 160 ug via INTRAVENOUS
  Administered 2022-11-29 (×2): 80 ug via INTRAVENOUS

## 2022-11-29 MED ORDER — COLCHICINE 0.6 MG PO TABS
0.6000 mg | ORAL_TABLET | Freq: Two times a day (BID) | ORAL | Status: DC
  Administered 2022-11-29: 0.6 mg via ORAL
  Filled 2022-11-29: qty 1

## 2022-11-29 MED ORDER — ONDANSETRON HCL 4 MG/2ML IJ SOLN
INTRAMUSCULAR | Status: DC | PRN
  Administered 2022-11-29: 4 mg via INTRAVENOUS

## 2022-11-29 MED ORDER — SODIUM CHLORIDE 0.9% FLUSH: 3.0000 mL | INTRAVENOUS | Status: DC | PRN

## 2022-11-29 MED ORDER — PROTAMINE SULFATE 10 MG/ML IV SOLN
INTRAVENOUS | Status: DC | PRN
  Administered 2022-11-29: 35 mg via INTRAVENOUS

## 2022-11-29 MED ORDER — PANTOPRAZOLE SODIUM 40 MG PO TBEC
40.0000 mg | DELAYED_RELEASE_TABLET | Freq: Every day | ORAL | 0 refills | Status: DC
  Filled 2022-11-29: qty 45, 45d supply, fill #0

## 2022-11-29 MED ORDER — ACETAMINOPHEN 325 MG PO TABS: 650.0000 mg | ORAL_TABLET | ORAL | Status: DC | PRN

## 2022-11-29 MED ORDER — SUGAMMADEX SODIUM 200 MG/2ML IV SOLN
INTRAVENOUS | Status: DC | PRN
  Administered 2022-11-29: 100 mg via INTRAVENOUS
  Administered 2022-11-29: 50 mg via INTRAVENOUS
  Administered 2022-11-29: 100 mg via INTRAVENOUS

## 2022-11-29 MED ORDER — PANTOPRAZOLE SODIUM 40 MG PO TBEC
40.0000 mg | DELAYED_RELEASE_TABLET | Freq: Every day | ORAL | Status: DC
  Administered 2022-11-29: 40 mg via ORAL
  Filled 2022-11-29: qty 1

## 2022-11-29 MED ORDER — HEPARIN (PORCINE) IN NACL 1000-0.9 UT/500ML-% IV SOLN
INTRAVENOUS | Status: DC | PRN
  Administered 2022-11-29 (×3): 500 mL

## 2022-11-29 MED ORDER — FENTANYL CITRATE (PF) 100 MCG/2ML IJ SOLN
INTRAMUSCULAR | Status: DC | PRN
  Administered 2022-11-29 (×2): 50 ug via INTRAVENOUS

## 2022-11-29 MED ORDER — MIDAZOLAM HCL 2 MG/2ML IJ SOLN
INTRAMUSCULAR | Status: AC
  Filled 2022-11-29: qty 2

## 2022-11-29 MED ORDER — PROPOFOL 10 MG/ML IV BOLUS
INTRAVENOUS | Status: DC | PRN
  Administered 2022-11-29: 120 mg via INTRAVENOUS

## 2022-11-29 MED ORDER — SODIUM CHLORIDE 0.9 % IV SOLN: 250.0000 mL | INTRAVENOUS | Status: DC | PRN

## 2022-11-29 MED ORDER — LIDOCAINE 2% (20 MG/ML) 5 ML SYRINGE
INTRAMUSCULAR | Status: DC | PRN
  Administered 2022-11-29: 60 mg via INTRAVENOUS

## 2022-11-29 MED ORDER — HEPARIN SODIUM (PORCINE) 1000 UNIT/ML IJ SOLN
INTRAMUSCULAR | Status: DC | PRN
  Administered 2022-11-29: 4000 [IU] via INTRAVENOUS
  Administered 2022-11-29: 16000 [IU] via INTRAVENOUS

## 2022-11-29 SURGICAL SUPPLY — 16 items
CATH ABLAT QDOT MICRO BI TC DF (CATHETERS) IMPLANT
CATH OCTARAY 2.0 F 3-3-3-3-3 (CATHETERS) IMPLANT
CATH S-M CIRCA TEMP PROBE (CATHETERS) IMPLANT
CATH SOUNDSTAR ECO 8FR (CATHETERS) IMPLANT
CATH WEBSTER BI DIR CS D-F CRV (CATHETERS) IMPLANT
CLOSURE PERCLOSE PROSTYLE (VASCULAR PRODUCTS) IMPLANT
COVER SWIFTLINK CONNECTOR (BAG) ×2 IMPLANT
PACK EP LATEX FREE (CUSTOM PROCEDURE TRAY) ×1
PACK EP LF (CUSTOM PROCEDURE TRAY) ×2 IMPLANT
PAD DEFIB RADIO PHYSIO CONN (PAD) ×2 IMPLANT
PATCH CARTO3 (PAD) IMPLANT
SHEATH BAYLIS TRANSSEPTAL 98CM (NEEDLE) IMPLANT
SHEATH CARTO VIZIGO SM CVD (SHEATH) IMPLANT
SHEATH PINNACLE 8F 10CM (SHEATH) IMPLANT
SHEATH PINNACLE 9F 10CM (SHEATH) IMPLANT
TUBING SMART ABLATE COOLFLOW (TUBING) IMPLANT

## 2022-11-29 NOTE — Telephone Encounter (Signed)
Please let the patient know that I reviewed his CT scan of the chest from 11/24/2022.  There is some slight enlargement of a right hilar lymph node (lymph node in the middle of the chest near the airways) now 14 mm, increased in size from 11 mm.  This may not be an important finding but we should probably follow it with repeat imaging.  If he is having shortness of breath would like for him to see me in the office so we can talk about that and also the timing of a repeat scan of his chest to track that lymph node.

## 2022-11-29 NOTE — Telephone Encounter (Signed)
Dr Delton Coombes- pt is asking for your opinion on his cardiac CT done 11/24/22. He says still SOB at times. I advised that we would need to get him scheduled for appt. I just need to know if based on his scan if he needs a nodule clinic slot or not. Thank you!

## 2022-11-29 NOTE — Transfer of Care (Signed)
Immediate Anesthesia Transfer of Care Note  Patient: Joe Davis  Procedure(s) Performed: ATRIAL FIBRILLATION ABLATION  Patient Location: Cath Lab  Anesthesia Type:General  Level of Consciousness: drowsy and patient cooperative  Airway & Oxygen Therapy: Patient Spontanous Breathing and Patient connected to nasal cannula oxygen  Post-op Assessment: Report given to RN and Post -op Vital signs reviewed and stable  Post vital signs: Reviewed and stable  Last Vitals:  Vitals Value Taken Time  BP 99/59   Temp    Pulse 72   Resp 13   SpO2 95%     Last Pain:  Vitals:   11/29/22 0600  PainSc: 0-No pain      Patients Stated Pain Goal: 4 (11/29/22 0559)  Complications: There were no known notable events for this encounter.

## 2022-11-29 NOTE — Discharge Instructions (Addendum)
You have an appointment set up with the Atrial Fibrillation Clinic.  Multiple studies have shown that being followed by a dedicated atrial fibrillation clinic in addition to the standard care you receive from your other physicians improves health. We believe that enrollment in the atrial fibrillation clinic will allow [place] to better care for you.   The phone number to the Atrial Fibrillation Clinic is [phone]. The clinic is staffed Monday through Friday from 8:30am to 5pm.  Directions: The clinic is located in the [facility], 6TH FLOOR Enter the hospital at the MAIN ENTRANCE "A", use [employer] to the 6th floor.  Registration desk to the right of elevators on 6th floor  If you have any trouble locating the clinic, please don't hesitate to call [phone].   Cardiac Ablation, Care After  This sheet gives you information about how to care for yourself after your procedure. Your health care provider may also give you more specific instructions. If you have problems or questions, contact your health care provider. What can I expect after the procedure? After the procedure, it is common to have: Bruising around your puncture site. Tenderness around your puncture site. Skipped heartbeats. If you had an atrial fibrillation ablation, you may have atrial fibrillation during the first several months after your procedure.  Tiredness (fatigue).  Follow these instructions at home: Puncture site care  Follow instructions from your health care provider about how to take care of your puncture site. Make sure you: If present, leave stitches (sutures), skin glue, or adhesive strips in place. These skin closures may need to stay in place for up to 2 weeks. If adhesive strip edges start to loosen and curl up, you may trim the loose edges. Do not remove adhesive strips completely unless your health care provider tells you to do that. If a large square bandage is present, this may be  removed 24 hours after surgery.  Check your puncture site every day for signs of infection. Check for: Redness, swelling, or pain. Fluid or blood. If your puncture site starts to bleed, lie down on your back, apply firm pressure to the area, and contact your health care provider. Warmth. Pus or a bad smell. A pea or small marble sized lump at the site is normal and can take up to three months to resolve.  Driving Do not drive for at least 4 days after your procedure or however long your health care provider recommends. (Do not resume driving if you have previously been instructed not to drive for other health reasons.) Do not drive or use heavy machinery while taking prescription pain medicine. Activity Avoid activities that take a lot of effort for at least 7 days after your procedure. Do not lift anything that is heavier than 5 lb (4.5 kg) for one week.  No sexual activity for 1 week.  Return to your normal activities as told by your health care provider. Ask your health care provider what activities are safe for you. General instructions Take over-the-counter and prescription medicines only as told by your health care provider. Do not use any products that contain nicotine or tobacco, such as cigarettes and e-cigarettes. If you need help quitting, ask your health care provider. You may shower after 24 hours, but Do not take baths, swim, or use a hot tub for 1 week.  Do not drink alcohol for 24 hours after your procedure. Keep all follow-up visits as told by your health care provider. This is important. Contact  a health care provider if: You have redness, mild swelling, or pain around your puncture site. You have fluid or blood coming from your puncture site that stops after applying firm pressure to the area. Your puncture site feels warm to the touch. You have pus or a bad smell coming from your puncture site. You have a fever. You have chest pain or discomfort that spreads to your  neck, jaw, or arm. You have chest pain that is worse with lying on your back or taking a deep breath. You are sweating a lot. You feel nauseous. You have a fast or irregular heartbeat. You have shortness of breath. You are dizzy or light-headed and feel the need to lie down. You have pain or numbness in the arm or leg closest to your puncture site. Get help right away if: Your puncture site suddenly swells. Your puncture site is bleeding and the bleeding does not stop after applying firm pressure to the area. These symptoms may represent a serious problem that is an emergency. Do not wait to see if the symptoms will go away. Get medical help right away. Call your local emergency services (911 in the [place] Do not drive yourself to the hospital. Summary After the procedure, it is normal to have bruising and tenderness at the puncture site in your groin, neck, or forearm. Check your puncture site every day for signs of infection. Get help right away if your puncture site is bleeding and the bleeding does not stop after applying firm pressure to the area. This is a medical emergency. This information is not intended to replace advice given to you by your health care provider. Make sure you discuss any questions you have with your health care provider.

## 2022-11-29 NOTE — Progress Notes (Signed)
Patient and husband was given discharge instructions. Both verbalized understanding. 

## 2022-11-29 NOTE — Anesthesia Preprocedure Evaluation (Signed)
Anesthesia Evaluation  Patient identified by MRN, date of birth, ID band Patient awake    Reviewed: Allergy & Precautions, H&P , NPO status , Patient's Chart, lab work & pertinent test results  History of Anesthesia Complications Negative for: history of anesthetic complications  Airway Mallampati: II  TM Distance: >3 FB Neck ROM: Full    Dental  (+) Teeth Intact, Dental Advisory Given   Pulmonary neg pulmonary ROS, neg sleep apnea, neg COPD, neg recent URI, former smoker   breath sounds clear to auscultation       Cardiovascular + CAD  + dysrhythmias Atrial Fibrillation  Rhythm:Irregular    1. Left ventricular ejection fraction, by estimation, is 55 to 60%. The  left ventricle has normal function. The left ventrical is not well  visualized. There is mildly increased left ventricular hypertrophy. Left  ventricular diastolic parameters are  consistent with Grade I diastolic dysfunction (impaired relaxation).   2. Right ventricular systolic function is normal. The right ventricular  size is normal. There is normal pulmonary artery systolic pressure.   3. No left atrial/left atrial appendage thrombus was detected.   4. The mitral valve is normal in structure and function. no evidence of  mitral valve regurgitation. No evidence of mitral stenosis.   5. The aortic valve was not well visualized. Aortic valve regurgitation  is trivial . Mild aortic valve sclerosis is present, with no evidence of  aortic valve stenosis.   6. The inferior vena cava is normal in size with greater than 50%  respiratory variability, suggesting right atrial pressure of 3 mmHg.     Neuro/Psych negative neurological ROS  negative psych ROS   GI/Hepatic negative GI ROS, Neg liver ROS,,,  Endo/Other  negative endocrine ROS    Renal/GU negative Renal ROSLab Results      Component                Value               Date                      CREATININE                0.82                11/22/2022             negative genitourinary   Musculoskeletal  (+) Arthritis ,    Abdominal   Peds  Hematology  (+) Blood dyscrasia Lab Results      Component                Value               Date                      WBC                      6.4                 11/22/2022                HGB                      13.8                11/22/2022  HCT                      41.9                11/22/2022                MCV                      86                  11/22/2022                PLT                      191                 11/22/2022            xarelto   Anesthesia Other Findings   Reproductive/Obstetrics                              Anesthesia Physical Anesthesia Plan  ASA: 2  Anesthesia Plan: General   Post-op Pain Management: Minimal or no pain anticipated   Induction: Intravenous  PONV Risk Score and Plan: 2 and Ondansetron, Dexamethasone, Propofol infusion and TIVA  Airway Management Planned: Oral ETT  Additional Equipment: None  Intra-op Plan:   Post-operative Plan: Extubation in OR  Informed Consent: I have reviewed the patients History and Physical, chart, labs and discussed the procedure including the risks, benefits and alternatives for the proposed anesthesia with the patient or authorized representative who has indicated his/her understanding and acceptance.     Dental advisory given  Plan Discussed with: CRNA  Anesthesia Plan Comments:          Anesthesia Quick Evaluation

## 2022-11-29 NOTE — Anesthesia Procedure Notes (Signed)
Procedure Name: Intubation Date/Time: 11/29/2022 7:52 AM  Performed by: Audie Pinto, CRNAPre-anesthesia Checklist: Patient identified, Emergency Drugs available, Suction available and Patient being monitored Patient Re-evaluated:Patient Re-evaluated prior to induction Oxygen Delivery Method: Circle system utilized Preoxygenation: Pre-oxygenation with 100% oxygen Induction Type: IV induction Ventilation: Oral airway inserted - appropriate to patient size and Two handed mask ventilation required Laryngoscope Size: Mac and 4 Grade View: Grade II Tube type: Oral Tube size: 7.5 mm Number of attempts: 1 Airway Equipment and Method: Stylet and Oral airway Placement Confirmation: ETT inserted through vocal cords under direct vision, positive ETCO2 and breath sounds checked- equal and bilateral Secured at: 23 cm Tube secured with: Tape Dental Injury: Teeth and Oropharynx as per pre-operative assessment

## 2022-11-29 NOTE — Anesthesia Postprocedure Evaluation (Signed)
Anesthesia Post Note  Patient: Joe Davis  Procedure(s) Performed: ATRIAL FIBRILLATION ABLATION     Patient location during evaluation: PACU Anesthesia Type: General Level of consciousness: awake and patient cooperative Pain management: pain level controlled Vital Signs Assessment: post-procedure vital signs reviewed and stable Respiratory status: spontaneous breathing, nonlabored ventilation, respiratory function stable and patient connected to nasal cannula oxygen Cardiovascular status: blood pressure returned to baseline and stable Postop Assessment: no apparent nausea or vomiting Anesthetic complications: no   There were no known notable events for this encounter.  Last Vitals:  Vitals:   11/29/22 1200 11/29/22 1300  BP: 121/77 119/75  Pulse: 77 (!) 55  Resp: 14 15  Temp:    SpO2: 93% 96%    Last Pain:  Vitals:   11/29/22 1015  TempSrc:   PainSc: 0-No pain                 Gionna Polak

## 2022-11-29 NOTE — H&P (Signed)
Electrophysiology Office Note:     Date:  11/29/2022    ID:  Joe Davis, DOB 04-21-1946, MRN [id number]   [facility] Cardiologist:  Charlton Haws, MD  [facility] Electrophysiologist:  None    Referring MD: Newman Nip, NP    Chief Complaint: Atrial fibrillation   History of Present Illness:     Joe Davis is a 77 y.o. male who I am seeing today for an opinion regarding atrial fibrillation at the request of Joe Davis.  The patient has a medical history that includes DVT/PE, coronary artery disease, hypertension.  He had a cardioversion on September 05, 2022 and maintained sinus rhythm for about 5 days.  He is symptomatic with fatigue while in atrial fibrillation.  Based on his loop recorder monitor, his atrial fibrillation became persistent July 19, 2022.  Potentially made worse by influenza.  Ablation, amiodarone and Tikosyn were discussed at the appointment with Joe Davis and the patient wished to meet with me today to discuss possible catheter ablation.  He is on Xarelto for stroke prophylaxis.   He is an active man and likes to garden.  When in atrial fibrillation, he is fatigued and has a reduced exercise tolerance.  For the 5 days he was in normal rhythm he felt much better.   He is with his wife today in clinic.      Presents for PVI today. Procedure reviewed.      Objective  Their past medical, social and family history was reveiwed.     ROS:   Please see the history of present illness.    All other systems reviewed and are negative.   EKGs/Labs/Other Studies Reviewed:     The following studies were reviewed today:   September 26, 2022 loop recorder interrogation personally reviewed          Physical Exam:     VS:  BP 138/73   Pulse 83   Ht 5' 10.5" (1.791 m)   Wt 230 lb 6.4 oz (104.5 kg)   SpO2 97%   BMI 32.59 kg/m         Wt Readings from Last 3 Encounters:  10/12/22 230 lb 6.4 oz (104.5 kg)  09/20/22 232 lb (105.2 kg)  09/05/22  217 lb (98.4 kg)      GEN:  Well nourished, well developed in no acute distress CARDIAC: Irregularly irregular, no murmurs, rubs, gallops RESPIRATORY:  Clear to auscultation without rales, wheezing or rhonchi          Assessment ASSESSMENT AND PLAN:     1. Persistent atrial fibrillation (HCC)   2. Coronary artery disease involving native coronary artery of native heart without angina pectoris       #Persistent atrial fibrillation Symptomatic On Xarelto for stroke prophylaxis   Discussed treatment options today for their AF including antiarrhythmic drug therapy and ablation. Discussed risks, recovery and likelihood of success. Discussed potential need for repeat ablation procedures and antiarrhythmic drugs after an initial ablation. They wish to proceed with scheduling.   Risk, benefits, and alternatives to EP study and radiofrequency ablation for afib were also discussed in detail today. These risks include but are not limited to stroke, bleeding, vascular damage, tamponade, perforation, damage to the esophagus, lungs, and other structures, pulmonary vein stenosis, worsening renal function, and death. The patient understands these risk and wishes to proceed.  We will therefore proceed with catheter ablation at the next available time.  Carto, ICE, anesthesia are requested for the procedure.  Will also obtain CT PV protocol prior to the procedure to exclude LAA thrombus and further evaluate atrial anatomy.   #Coronary artery disease No ischemic symptoms today I have encouraged him to take his statin daily.  If he tolerates daily dosing of his statin, plan to increase the dose to 20 mg by mouth once daily around the time of his ablation.    Presents for PVI today. Procedure reviewed.           Signed, [name]. Lalla Brothers, MD, [facility], [facility] 11/29/2022 Electrophysiology [facility]

## 2022-11-30 ENCOUNTER — Encounter: Payer: Self-pay | Admitting: Family Medicine

## 2022-11-30 ENCOUNTER — Ambulatory Visit (INDEPENDENT_AMBULATORY_CARE_PROVIDER_SITE_OTHER): Payer: Medicare Other | Admitting: Family Medicine

## 2022-11-30 ENCOUNTER — Encounter: Payer: Self-pay | Admitting: Cardiology

## 2022-11-30 VITALS — BP 122/68 | HR 67 | Temp 98.1°F | Wt 234.0 lb

## 2022-11-30 DIAGNOSIS — I480 Paroxysmal atrial fibrillation: Secondary | ICD-10-CM

## 2022-11-30 DIAGNOSIS — Z8679 Personal history of other diseases of the circulatory system: Secondary | ICD-10-CM | POA: Diagnosis not present

## 2022-11-30 DIAGNOSIS — H538 Other visual disturbances: Secondary | ICD-10-CM | POA: Diagnosis not present

## 2022-11-30 DIAGNOSIS — Z9889 Other specified postprocedural states: Secondary | ICD-10-CM | POA: Diagnosis not present

## 2022-11-30 MED FILL — Lidocaine HCl Local Preservative Free (PF) Inj 1%: INTRAMUSCULAR | Qty: 30 | Status: AC

## 2022-11-30 NOTE — Telephone Encounter (Signed)
Spoke with patient's wife after reviewing her message with Dr Lalla Brothers. Dr Lalla Brothers wants patient to see PCP for evaluation. Patient is scheduled with PCP this afternoon.

## 2022-11-30 NOTE — Telephone Encounter (Signed)
    Latest Ref Rng & Units 11/22/2022   11:26 AM 08/29/2022    9:56 AM 09/07/2021    8:55 AM  CBC  WBC 3.4 - 10.8 x10E3/uL 6.4  6.3  6.0   Hemoglobin 13.0 - 17.7 g/dL 69.6  29.5  28.4   Hematocrit 37.5 - 51.0 % 41.9  45.5  45.0   Platelets 150 - 450 x10E3/uL 191  210  200    Let the patient know that I reviewed his CBC done 11/22/2022.  I do not see anything that would necessarily prompt a referral to hematology.  Hemoglobin of 13.8 would not usually be associated with exertional shortness of breath.  I would recommend that he talk to Dr. Caryl Never about this and see if he believes a referral might be helpful.

## 2022-11-30 NOTE — Progress Notes (Signed)
Established Patient Office Visit   Subjective  Patient ID: Joe Davis, male    DOB: 03-May-1946  Age: 77 y.o. MRN: [id number]  Chief Complaint  Patient presents with   Blurred Vision    Noticed this morning around 9 am. Mostly in rt eye, was wavy, like sea waves, a little in lt eye. Went away after about 12 minutes. No HA, no other pains. Something happened about 2 weeks ago but with  colors and a rainbow, but it also went away   Pt accompanied by his wife.  Patient is a 77 year old male with pmh sig for afib, gout, glaucoma, BPH, pulmonary nodules, history of DVT, on chronic anticoagulation, CAD, SVT, prediabetes who is followed by Dr. Caryl Never and seen for acute concern.  Patient endorses blurring/ocean waves and vision this morning.  Symptoms lasted about 15 minutes then resolved.  Patient notes an episode 2 weeks prior but the waves were in color.  Patient states pt was laying in bed during the first episode but during episode today patient was under increased stress.  Of note patient had a cardiac ablation yesterday.  On Xarelto.  Denies eye pain or redness, LE weakness, UE weakness, facial droop, headache, dizziness, confusion, CP, history of HTN.  Pt's wife contacted [facility] ophthalmology, advised to proceed to nearest ED.  Pt states he was also advised to see a neurologist.  Patient refused to go to ED 2/2 long wait time.    Patient Active Problem List   Diagnosis Date Noted   Dyspnea 12/31/2021   Coronary artery disease involving native coronary artery of native heart without angina pectoris 09/07/2021   Paroxysmal atrial fibrillation 09/07/2021   Supraventricular tachycardia 09/07/2021   Lower leg DVT (deep venous thromboembolism), acute, left 03/18/2020   Pulmonary nodules/lesions, multiple 02/06/2020   Back pain 08/11/2015   Low back pain 08/11/2015   Left-sided low back pain with left-sided sciatica    Prediabetes 07/28/2015   BPH (benign prostatic hyperplasia) 01/22/2015    History of hip surgery 10/05/14 10/11/2014   Diastolic dysfunction 10/11/2014   Syncope and collapse 10/10/2014   Faintness    Gout 10/03/2013   Acute medial meniscal tear 10/03/2012   Palpitation 05/03/2011   Atypical chest pain 05/03/2011   Other hyperlipidemia 01/26/2011   BACK PAIN 01/23/2007   Pain in limb 01/23/2007   SKIN CANCER, HX OF 01/23/2007   Remote history of venous thrombosis and embolism 01/23/2007   Past Surgical History:  Procedure Laterality Date   ATRIAL FIBRILLATION ABLATION N/A 11/29/2022   Procedure: ATRIAL FIBRILLATION ABLATION;  Surgeon: Lanier Prude, MD;  Location: [facility];  Service: Cardiovascular;  Laterality: N/A;   CARDIOVASCULAR STRESS TEST  12/13/2009   EF 73%; No ischemia   CARDIOVERSION N/A 09/05/2022   Procedure: CARDIOVERSION;  Surgeon: Thomasene Ripple, DO;  Location: [facility] ENDOSCOPY;  Service: Cardiovascular;  Laterality: N/A;   COLONOSCOPY  08/16/2011   COLONOSCOPY WITH PROPOFOL N/A 05/18/2022   Procedure: COLONOSCOPY WITH PROPOFOL;  Surgeon: Willis Modena, MD;  Location: [facility] ENDOSCOPY;  Service: Gastroenterology;  Laterality: N/A;   IVC FILTER INSERTION N/A 10/06/2020   Procedure: IVC FILTER INSERTION;  Surgeon: Nada Libman, MD;  Location: [facility] INVAS[facility] CV LAB;  Service: Cardiovascular;  Laterality: N/A;  w/ IVC Venogram   IVC FILTER REMOVAL N/A 01/05/2021   Procedure: IVC FILTER REMOVAL;  Surgeon: Nada Libman, MD;  Location: [facility];  Service: Cardiovascular;  Laterality: N/A;   JOINT REPLACEMENT  KNEE ARTHROSCOPY Right 10/03/2012   Procedure: RIGHT KNEE ARTHROSCOPY WITH DEBRIDEMENT;  Surgeon: Loanne Drilling, MD;  Location: [facility] ORS;  Service: Orthopedics;  Laterality: Right;  WITH DEBRIDEMENT   LOOP RECORDER IMPLANT Left    MOHS SURGERY  6 yrs    nose   POLYPECTOMY  05/18/2022   Procedure: POLYPECTOMY;  Surgeon: Willis Modena, MD;  Location: [facility] ENDOS[facility];  Service: Gastroenterology;;   SKIN CANCER  EXCISION     left upper arm, biopsy   TOTAL HIP ARTHROPLASTY Right 10/06/2014   Social History   Tobacco Use   Smoking status: Former    Packs/day: 1.00    Years: 10.00    Additional pack years: 0.00    Total pack years: 10.00    Types: Cigarettes    Quit date: 08/15/1977    Years since quitting: 45.3   Smokeless tobacco: Never  Vaping Use   Vaping Use: Never used  Substance Use Topics   Alcohol use: Yes    Alcohol/week: 7.0 standard drinks of alcohol    Types: 7 Shots of liquor per week   Drug use: No   Family History  Adopted: Yes  Family history unknown: Yes   Allergies  Allergen Reactions   Tape Other (See Comments)    Sensitivity: use paper tape      ROS Negative unless stated above    Objective:     BP 122/68 (BP Location: Left Arm, Patient Position: Sitting, Cuff Size: Normal)   Pulse 67   Temp 98.1 F (36.7 C) (Oral)   Wt 234 lb (106.1 kg)   SpO2 98%   BMI 33.10 kg/m  BP Readings from Last 3 Encounters:  11/30/22 122/68  11/29/22 119/75  11/24/22 137/83   Wt Readings from Last 3 Encounters:  11/30/22 234 lb (106.1 kg)  11/29/22 250 lb (113.4 kg)  10/12/22 230 lb 6.4 oz (104.5 kg)      Physical Exam Constitutional:      Appearance: Normal appearance.  HENT:     Head: Normocephalic and atraumatic.     Nose: Nose normal.     Mouth/Throat:     Mouth: Mucous membranes are moist.  Eyes:     Extraocular Movements: Extraocular movements intact.     Right eye: No nystagmus.     Left eye: No nystagmus.     Conjunctiva/sclera: Conjunctivae normal.     Pupils: Pupils are equal, round, and reactive to light.     Funduscopic exam:    Right eye: No hemorrhage, exudate, AV nicking or papilledema.        Left eye: No hemorrhage, exudate, AV nicking or papilledema.     Comments: Wearing glasses.  Limited funduscopic exam performed.  Neurological:     Mental Status: He is alert.      No results found for any visits on 11/30/22.    Assessment  & Plan:  Blurred vision, bilateral -     Ambulatory referral to Ophthalmology  S/P ablation of atrial fibrillation  Paroxysmal atrial fibrillation -Controlled s/p ablation 11/29/2022 -Continue current medications including Xarelto -Continue follow-up with cardiology  Discussed limited evaluation capabilities in clinic.  Patient with blurred vision on 2 occasions.  Expressed concern for TIA.  Also consider retinal detachment.  Limited funduscopic exam performed in clinic.  Referral to ophthalmology.  Given strict precautions.  Return if symptoms worsen or fail to improve.   Deeann Saint, MD

## 2022-11-30 NOTE — Patient Instructions (Addendum)
Appointment with [facility] is not until August 2024.  A referral to see an ophthalmologist here in town was placed.  You should expect a phone call about scheduling this appointment.  For continued symptoms proceed to nearest emergency department for evaluation.

## 2022-12-01 ENCOUNTER — Encounter: Payer: Self-pay | Admitting: Cardiology

## 2022-12-01 DIAGNOSIS — I319 Disease of pericardium, unspecified: Secondary | ICD-10-CM | POA: Diagnosis not present

## 2022-12-01 DIAGNOSIS — I4460 Unspecified fascicular block: Secondary | ICD-10-CM | POA: Diagnosis not present

## 2022-12-01 DIAGNOSIS — I2510 Atherosclerotic heart disease of native coronary artery without angina pectoris: Secondary | ICD-10-CM | POA: Diagnosis not present

## 2022-12-01 DIAGNOSIS — Z79899 Other long term (current) drug therapy: Secondary | ICD-10-CM | POA: Diagnosis not present

## 2022-12-01 DIAGNOSIS — Z85828 Personal history of other malignant neoplasm of skin: Secondary | ICD-10-CM | POA: Diagnosis not present

## 2022-12-01 DIAGNOSIS — M109 Gout, unspecified: Secondary | ICD-10-CM | POA: Diagnosis not present

## 2022-12-01 DIAGNOSIS — Z87891 Personal history of nicotine dependence: Secondary | ICD-10-CM | POA: Diagnosis not present

## 2022-12-01 DIAGNOSIS — I444 Left anterior fascicular block: Secondary | ICD-10-CM | POA: Diagnosis not present

## 2022-12-01 DIAGNOSIS — R0602 Shortness of breath: Secondary | ICD-10-CM | POA: Diagnosis not present

## 2022-12-01 DIAGNOSIS — I10 Essential (primary) hypertension: Secondary | ICD-10-CM | POA: Diagnosis not present

## 2022-12-01 DIAGNOSIS — J90 Pleural effusion, not elsewhere classified: Secondary | ICD-10-CM | POA: Diagnosis not present

## 2022-12-01 DIAGNOSIS — I214 Non-ST elevation (NSTEMI) myocardial infarction: Secondary | ICD-10-CM | POA: Diagnosis not present

## 2022-12-01 DIAGNOSIS — E782 Mixed hyperlipidemia: Secondary | ICD-10-CM | POA: Diagnosis not present

## 2022-12-01 DIAGNOSIS — I5A Non-ischemic myocardial injury (non-traumatic): Secondary | ICD-10-CM | POA: Diagnosis not present

## 2022-12-01 DIAGNOSIS — Z86711 Personal history of pulmonary embolism: Secondary | ICD-10-CM | POA: Diagnosis not present

## 2022-12-01 DIAGNOSIS — Z7901 Long term (current) use of anticoagulants: Secondary | ICD-10-CM | POA: Diagnosis not present

## 2022-12-01 DIAGNOSIS — I517 Cardiomegaly: Secondary | ICD-10-CM | POA: Diagnosis not present

## 2022-12-01 DIAGNOSIS — Z96643 Presence of artificial hip joint, bilateral: Secondary | ICD-10-CM | POA: Diagnosis not present

## 2022-12-01 DIAGNOSIS — I358 Other nonrheumatic aortic valve disorders: Secondary | ICD-10-CM | POA: Diagnosis not present

## 2022-12-01 DIAGNOSIS — I459 Conduction disorder, unspecified: Secondary | ICD-10-CM | POA: Diagnosis not present

## 2022-12-01 DIAGNOSIS — I480 Paroxysmal atrial fibrillation: Secondary | ICD-10-CM | POA: Diagnosis not present

## 2022-12-01 DIAGNOSIS — I361 Nonrheumatic tricuspid (valve) insufficiency: Secondary | ICD-10-CM | POA: Diagnosis not present

## 2022-12-01 DIAGNOSIS — E7800 Pure hypercholesterolemia, unspecified: Secondary | ICD-10-CM | POA: Diagnosis not present

## 2022-12-01 DIAGNOSIS — I4819 Other persistent atrial fibrillation: Secondary | ICD-10-CM | POA: Diagnosis not present

## 2022-12-01 DIAGNOSIS — R079 Chest pain, unspecified: Secondary | ICD-10-CM | POA: Diagnosis not present

## 2022-12-01 DIAGNOSIS — Z86718 Personal history of other venous thrombosis and embolism: Secondary | ICD-10-CM | POA: Diagnosis not present

## 2022-12-02 ENCOUNTER — Encounter: Payer: Self-pay | Admitting: Cardiology

## 2022-12-02 DIAGNOSIS — I358 Other nonrheumatic aortic valve disorders: Secondary | ICD-10-CM | POA: Diagnosis not present

## 2022-12-02 DIAGNOSIS — I517 Cardiomegaly: Secondary | ICD-10-CM | POA: Diagnosis not present

## 2022-12-02 DIAGNOSIS — I361 Nonrheumatic tricuspid (valve) insufficiency: Secondary | ICD-10-CM | POA: Diagnosis not present

## 2022-12-05 ENCOUNTER — Ambulatory Visit (INDEPENDENT_AMBULATORY_CARE_PROVIDER_SITE_OTHER): Payer: Medicare Other

## 2022-12-05 ENCOUNTER — Telehealth: Payer: Self-pay

## 2022-12-05 DIAGNOSIS — R55 Syncope and collapse: Secondary | ICD-10-CM | POA: Diagnosis not present

## 2022-12-05 NOTE — Transitions of Care (Post Inpatient/ED Visit) (Signed)
   12/05/2022  Name: Joe Davis MRN: [id number] DOB: 10-07-1945  Today's TOC FU Call Status: Today's TOC FU Call Status:: Successful TOC FU Call Competed TOC FU Call Complete Date: 12/05/22  Transition Care Management Follow-up Telephone Call Date of Discharge: 12/02/22 Discharge Facility: Other ([facility]) Name of Other ([facility]) Discharge Facility: [facility] Type of Discharge: Inpatient Admission Primary Inpatient Discharge Diagnosis:: "chest pain,unspecified" How have you been since you were released from the hospital?: Better (Spoke w/ sposue. She states pt "doing better." Denies any acute issues or concerns at present.Spouse states that chest x-ray in hospital showed "some changes". She plans to discuss further with MDs at upcmoing appts.) Any questions or concerns?: No  Items Reviewed: Did you receive and understand the discharge instructions provided?: Yes Medications obtained and verified?: No (TOC call completed by Cardiology [facility] team prior to RN CM call and meds reviewed) Any new allergies since your discharge?: No Dietary orders reviewed?: Yes Type of Diet Ordered:: low salt/heart healthy Do you have support at home?: Yes People in Home: spouse Name of Support/Comfort Primary Source: [facility] and Equipment/Supplies: Were Home Health Services Ordered?: NA Any new equipment or medical supplies ordered?: NA  Functional Questionnaire: Do you need assistance with bathing/showering or dressing?: No Do you need assistance with meal preparation?: No Do you need assistance with eating?: No Do you have difficulty maintaining continence: No Do you need assistance with getting out of bed/getting out of a chair/moving?: No Do you have difficulty managing or taking your medications?: No  Follow up appointments reviewed: PCP Follow-up appointment confirmed?: No (Spouse prefers to call office at later time to make an appt after cardiology and pulmonology appt  arranged/completed) MD Provider Line Number:[phone] Given: No Specialist Hospital Follow-up appointment confirmed?: Yes Date of Specialist follow-up appointment?: 12/09/22 Follow-Up Specialty Provider:: Dr. Lalla Brothers Do you need transportation to your follow-up appointment?: No Do you understand care options if your condition(s) worsen?: Yes-patient verbalized understanding  SDOH Interventions Today    Flowsheet Row Most Recent Value  SDOH Interventions   Food Insecurity Interventions Intervention Not Indicated  Transportation Interventions Intervention Not Indicated       TOC Interventions Today    Flowsheet Row Most Recent Value  TOC Interventions   TOC Interventions Discussed/Reviewed TOC Interventions Discussed      Interventions Today    Flowsheet Row Most Recent Value  General Interventions   General Interventions Discussed/Reviewed General Interventions Discussed, Doctor Visits  Doctor Visits Discussed/Reviewed Doctor Visits Discussed, PCP, Specialist  PCP/Specialist Visits Compliance with follow-up visit  Education Interventions   Education Provided Provided Education  Provided Verbal Education On When to see the doctor, Medication, Nutrition  Nutrition Interventions   Nutrition Discussed/Reviewed Nutrition Discussed  Pharmacy Interventions   Pharmacy Dicussed/Reviewed Pharmacy Topics Discussed, Medications and their functions        Alessandra Grout [facility] Care Management Care Management Community Coordinator Direct Phone: [phone] Toll Free: [phone] Fax: [phone]

## 2022-12-06 LAB — CUP PACEART REMOTE DEVICE CHECK
Date Time Interrogation Session: 20240419230701
Implantable Pulse Generator Implant Date: 20220607

## 2022-12-09 ENCOUNTER — Encounter: Payer: Medicare Other | Admitting: Physician Assistant

## 2022-12-12 NOTE — Progress Notes (Signed)
Carelink Summary Report / Loop Recorder 

## 2022-12-16 ENCOUNTER — Other Ambulatory Visit: Payer: Self-pay | Admitting: Cardiovascular Disease

## 2022-12-23 ENCOUNTER — Ambulatory Visit: Payer: Medicare Other | Admitting: Physician Assistant

## 2022-12-27 ENCOUNTER — Ambulatory Visit (HOSPITAL_COMMUNITY)
Admission: RE | Admit: 2022-12-27 | Discharge: 2022-12-27 | Disposition: A | Discharge status: Home / Self Care | Payer: Medicare Other | Source: Ambulatory Visit | Attending: Internal Medicine | Admitting: Internal Medicine

## 2022-12-27 ENCOUNTER — Encounter (HOSPITAL_COMMUNITY): Payer: Self-pay | Admitting: Internal Medicine

## 2022-12-27 VITALS — BP 132/84 | HR 66 | Ht 70.5 in | Wt 224.0 lb

## 2022-12-27 DIAGNOSIS — Z7901 Long term (current) use of anticoagulants: Secondary | ICD-10-CM | POA: Insufficient documentation

## 2022-12-27 DIAGNOSIS — I4819 Other persistent atrial fibrillation: Secondary | ICD-10-CM | POA: Diagnosis not present

## 2022-12-27 DIAGNOSIS — D6869 Other thrombophilia: Secondary | ICD-10-CM

## 2022-12-27 DIAGNOSIS — I2510 Atherosclerotic heart disease of native coronary artery without angina pectoris: Secondary | ICD-10-CM | POA: Insufficient documentation

## 2022-12-27 DIAGNOSIS — I10 Essential (primary) hypertension: Secondary | ICD-10-CM | POA: Insufficient documentation

## 2022-12-27 NOTE — Progress Notes (Signed)
Primary Care Physician: Kristian Covey, MD Referring Physician: Dr. Eden Emms  EP: Dr. Ronn Melena is a 77 y.o. male with a h/o DVT with PE, CAD, HTN, that was referred  to the afib clinic for up tick in afib, as notified by the device clinic.Pt has recently had the flu. EKG today shows afib rate controlled. Appears to be persistent since 07/19/22. He is tolerating well, with minimal symptoms. He is on xarelto for a prior DVT/PE as well as CHA2DS2VASc score of at least 5, no missed doses.  He has a h/o of prior syncope for which he received a loop back in 2021. HIs wife, a prior anesthetist, has the understanding it was possibly from  post afib termination pauses. She is concerned re pt undergoing a cardioversion. However, Dr. Lubertha Basque note said his symptoms were fairly typcial for neurally mediated syncope. After exploring risk vrs benefit, pt and wife want to pursue DCCV, as pt has noted some mild dyspnea and fatigue. He has been fairly well rate controlled.   F/u in afib clinic, 09/20/22, f/u successful cardioversion. Unfortunately, he had ERAF, with only maintaining  SR for around 5 days. He feels tired in afib.   F/u in Afib clinic, 12/27/22. He is NSR today. He is s/p Afib ablation by Dr. Lalla Brothers on 11/29/22. He did well immediately after ablation. Unfortunately, he was admitted secondary to exertional chest pain on 4/18 and [facility] and discharged on 4/19. Echo and CT unremarkable for acute infarct or PE, respectively. Troponins were elevated which prompted overnight admission; he was ultimately discharged with findings likely secondary to pericarditis and advised to continue with colchicine. He has not had any additional episodes of chest pain since then. He has a great amount of energy now. No episodes of Afib since ablation. No shortness of breath or trouble swallowing. He has been compliant with anticoagulation.   Today, he denies symptoms of palpitations, chest pain, shortness  of breath, orthopnea, PND, lower extremity edema, dizziness, presyncope, syncope, or neurologic sequela. The patient is tolerating medications without difficulties and is otherwise without complaint today.   Past Medical History:  Diagnosis Date   Arthritis    "knees, hips" (10/10/2014)   Basal cell carcinoma    BPH (benign prostatic hyperplasia)    Bradycardia vasovagal response 10 yrs ago   asymptomatic   Complication of anesthesia    bradycardia with spinal   Coronary artery disease    Diastolic dysfunction    Per echo in 2008   DVT (deep venous thrombosis) (HCC) 08/15/1977   RLE   Erectile dysfunction    History of gout    History of nuclear stress test    ETT-Myoview 3/17: Hypertensive blood pressure response to exercise, normal perfusion, EF 60%, low risk study   Hypercholesterolemia    ? statin intolerant. May tolerate Lipitor   Normal nuclear stress test 12/13/2009   Obesity    Pulmonary embolism (HCC) 08/15/1977   Syncope and collapse 8-10 yrs ago   Past Surgical History:  Procedure Laterality Date   ATRIAL FIBRILLATION ABLATION N/A 11/29/2022   Procedure: ATRIAL FIBRILLATION ABLATION;  Surgeon: Lanier Prude, MD;  Location: [facility] INVASIVE [facility];  Service: Cardiovascular;  Laterality: N/A;   CARDIOVASCULAR STRESS TEST  12/13/2009   EF 73%; No ischemia   CARDIOVERSION N/A 09/05/2022   Procedure: CARDIOVERSION;  Surgeon: Thomasene Ripple, DO;  Location: [facility] ENDOSCOPY;  Service: Cardiovascular;  Laterality: N/A;   COLONOSCOPY  08/16/2011  COLONOSCOPY WITH PROPOFOL N/A 05/18/2022   Procedure: COLONOSCOPY WITH PROPOFOL;  Surgeon: Willis Modena, MD;  Location: [facility] ENDOSCOPY;  Service: Gastroenterology;  Laterality: N/A;   IVC FILTER INSERTION N/A 10/06/2020   Procedure: IVC FILTER INSERTION;  Surgeon: Nada Libman, MD;  Location: [facility] INVAS[facility] CV LAB;  Service: Cardiovascular;  Laterality: N/A;  w/ IVC Venogram   IVC FILTER REMOVAL N/A 01/05/2021   Procedure: IVC FILTER  REMOVAL;  Surgeon: Nada Libman, MD;  Location: [facility];  Service: Cardiovascular;  Laterality: N/A;   JOINT REPLACEMENT     KNEE ARTHROSCOPY Right 10/03/2012   Procedure: RIGHT KNEE ARTHROSCOPY WITH DEBRIDEMENT;  Surgeon: Loanne Drilling, MD;  Location: [facility] ORS;  Service: Orthopedics;  Laterality: Right;  WITH DEBRIDEMENT   LOOP RECORDER IMPLANT Left    MOHS SURGERY  6 yrs    nose   POLYPECTOMY  05/18/2022   Procedure: POLYPECTOMY;  Surgeon: Willis Modena, MD;  Location: [facility] ENDOSCOPY;  Service: Gastroenterology;;   SKIN CANCER EXCISION     left upper arm, biopsy   TOTAL HIP ARTHROPLASTY Right 10/06/2014    Current Outpatient Medications  Medication Sig Dispense Refill   Colchicine 0.6 MG CAPS TAKE 1 CAPSULE BY MOUTH DAILY (Patient taking differently: Take 0.6 mg by mouth daily as needed (Gout).) 30 capsule 2   diclofenac Sodium (VOLTAREN) 1 % GEL Apply 2 g topically once a week.     metoprolol succinate (TOPROL-XL) 25 MG 24 hr tablet Take 1 tablet (25 mg total) by mouth every morning. And 1/2 tablet (12.5mg ) in the evening 180 tablet 3   rivaroxaban (XARELTO) 20 MG TABS tablet TAKE 1 TABLET EVERY DAY WITH SUPPER 90 tablet 5   rosuvastatin (CRESTOR) 10 MG tablet TAKE 1 TABLET(10 MG) BY MOUTH DAILY 30 tablet 0   tadalafil (CIALIS) 5 MG tablet Take 5 mg by mouth daily as needed for erectile dysfunction.  10   No current facility-administered medications for this encounter.    Allergies  Allergen Reactions   Tape Other (See Comments)    Sensitivity: use paper tape    Social History   Socioeconomic History   Marital status: Married    Spouse name: Not on file   Number of children: Not on file   Years of education: Not on file   Highest education level: Not on file  Occupational History   Not on file  Tobacco Use   Smoking status: Former    Packs/day: 1.00    Years: 10.00    Additional pack years: 0.00    Total pack years: 10.00    Types: Cigarettes    Quit  date: 08/15/1977    Years since quitting: 45.3   Smokeless tobacco: Never  Vaping Use   Vaping Use: Never used  Substance and Sexual Activity   Alcohol use: Yes    Alcohol/week: 7.0 standard drinks of alcohol    Types: 7 Shots of liquor per week   Drug use: No   Sexual activity: Yes  Other Topics Concern   Not on file  Social History Narrative   Not on file   Social Determinants of Health   Financial Resource Strain: Low Risk  (11/24/2021)   Overall Financial Resource Strain (CARDIA)    Difficulty of Paying Living Expenses: Not hard at all  Food Insecurity: No Food Insecurity (12/05/2022)   Hunger Vital Sign    Worried About Running Out of Food in the Last Year: Never true  Ran Out of Food in the Last Year: Never true  Transportation Needs: No Transportation Needs (12/05/2022)   PRAPARE - Administrator, Civil Service (Medical): No    Lack of Transportation (Non-Medical): No  Physical Activity: Sufficiently Active (11/24/2021)   Exercise Vital Sign    Days of Exercise per Week: 5 days    Minutes of Exercise per Session: 60 min  Stress: No Stress Concern Present (11/24/2021)   [employer] of Occupational Health - Occupational Stress Questionnaire    Feeling of Stress : Not at all  Social Connections: Moderately Isolated (11/24/2021)   Social Connection and Isolation Panel [NHANES]    Frequency of Communication with Friends and Family: More than three times a week    Frequency of Social Gatherings with Friends and Family: More than three times a week    Attends Religious Services: Never    Database administrator or Organizations: No    Attends Banker Meetings: Never    Marital Status: Married  Catering manager Violence: Not At Risk (11/24/2021)   Humiliation, Afraid, Rape, and Kick questionnaire    Fear of Current or Ex-Partner: No    Emotionally Abused: No    Physically Abused: No    Sexually Abused: No    Family History  Adopted: Yes   Family history unknown: Yes    ROS- All systems are reviewed and negative except as per the HPI above  Physical Exam: Vitals:   12/27/22 1054  BP: 132/84  Pulse: 66  Weight: 101.6 kg  Height: 5' 10.5" (1.791 m)   Wt Readings from Last 3 Encounters:  12/27/22 101.6 kg  11/30/22 106.1 kg  11/29/22 113.4 kg    Labs: Lab Results  Component Value Date   NA 147 (H) 11/22/2022   K 4.7 11/22/2022   CL 109 (H) 11/22/2022   CO2 20 11/22/2022   GLUCOSE 84 11/22/2022   BUN 17 11/22/2022   CREATININE 0.82 11/22/2022   CALCIUM 9.0 11/22/2022   Lab Results  Component Value Date   INR 1.11 10/10/2014   Lab Results  Component Value Date   CHOL 133 11/16/2021   HDL 44.90 11/16/2021   LDLCALC 75 11/16/2021   TRIG 66.0 11/16/2021    GEN- The patient is well appearing, alert and oriented x 3 today.   Head- normocephalic, atraumatic Eyes-  Sclera clear, conjunctiva pink Ears- hearing intact Oropharynx- clear Neck- supple, no JVP Lymph- no cervical lymphadenopathy Lungs- Clear to ausculation bilaterally, normal work of breathing Heart- Regular rate and rhythm, no murmurs, rubs or gallops, PMI not laterally displaced GI- soft, NT, ND, + BS Extremities- no clubbing, cyanosis, or edema MS- no significant deformity or atrophy Skin- no rash or lesion Psych- euthymic mood, full affect Neuro- strength and sensation are intact   EKG- Vent. rate 66 BPM PR interval 182 ms QRS duration 104 ms QT/QTcB 408/427 ms P-R-T axes 55 -43 25 Normal sinus rhythm Left axis deviation Abnormal ECG When compared with ECG of 29-Nov-2022 09:53, PREVIOUS ECG IS PRESENT   Assessment and Plan:  1. Persistent Afib  Became persistent  07/19/22.  S/p Afib ablation on 11/29/22 by Dr. Lalla Brothers.  He is in SR today.   He is doing well today and reports no recent episodes of Afib since ablation. No additional episodes of chest pain since [facility] admission 4/18-19 for same.   Continue current  medication regimen without change.   2. CHA2DS2VASc score of at least 5 No  missed doses of xarelto 20 mg daily   F/u as scheduled with Dr. Keitha Butte, PA-C Afib Clinic [facility] [address] [place], [place] [address] [phone]

## 2022-12-28 ENCOUNTER — Encounter: Payer: Self-pay | Admitting: Emergency Medicine

## 2022-12-28 ENCOUNTER — Ambulatory Visit (INDEPENDENT_AMBULATORY_CARE_PROVIDER_SITE_OTHER): Payer: Medicare Other | Admitting: Emergency Medicine

## 2022-12-28 VITALS — BP 142/76 | HR 60 | Temp 98.0°F | Ht 70.5 in | Wt 228.6 lb

## 2022-12-28 DIAGNOSIS — R918 Other nonspecific abnormal finding of lung field: Secondary | ICD-10-CM

## 2022-12-28 DIAGNOSIS — R9389 Abnormal findings on diagnostic imaging of other specified body structures: Secondary | ICD-10-CM

## 2022-12-28 DIAGNOSIS — R0609 Other forms of dyspnea: Secondary | ICD-10-CM | POA: Diagnosis not present

## 2022-12-28 NOTE — Assessment & Plan Note (Addendum)
Remarkably improved after his ablation.  Plan to continue to push exercise, conditioning to enhance his functional capacity.  Consider pulmonary function testing if he backtracks.

## 2022-12-28 NOTE — Progress Notes (Signed)
Subjective:    Patient ID: Joe Davis, male    DOB: November 05, 1945, 78 y.o.   MRN: [id number]  HPI  ROV 12/28/2022 --follow-up visit for 77 year old man with history of remote PE/VTE, hypertension with diastolic dysfunction, OSA not on CPAP, atrial fibrillation.  He underwent ablation 11/29/2022. I have followed him for an abnormal CT scan of the chest with some subpleural right middle lobe scar, waxing and waning other nodules.  He reports today that overall he has felt better since the ablation, although he had an episode of CP that had to be evaluated at [facility]. Reassuring eval.   CT cardiac morphology study 11/24/2022 reviewed by me showed some prominent right hilar nodes, 14 mm, shotty subcarinal node unchanged.  Stable peripheral scarring in the right middle lobe without any evidence of other airspace disease.  CT-PA 12/01/2022 [facility]), report available, shows some bibasilar groundglass opacity consistent with atelectasis, some bronchial wall thickening, trace right pleural effusion.  No significant mediastinal adenopathy   Review of Systems As per HPI  Past Medical History:  Diagnosis Date   Arthritis    "knees, hips" (10/10/2014)   Basal cell carcinoma    BPH (benign prostatic hyperplasia)    Bradycardia vasovagal response 10 yrs ago   asymptomatic   Complication of anesthesia    bradycardia with spinal   Coronary artery disease    Diastolic dysfunction    Per echo in 2008   DVT (deep venous thrombosis) (HCC) 08/15/1977   RLE   Erectile dysfunction    History of gout    History of nuclear stress test    ETT-Myoview 3/17: Hypertensive blood pressure response to exercise, normal perfusion, EF 60%, low risk study   Hypercholesterolemia    ? statin intolerant. May tolerate Lipitor   Normal nuclear stress test 12/13/2009   Obesity    Pulmonary embolism (HCC) 08/15/1977   Syncope and collapse 8-10 yrs ago     Family History  Adopted: Yes  Family history unknown: Yes      Social History   Socioeconomic History   Marital status: Married    Spouse name: Not on file   Number of children: Not on file   Years of education: Not on file   Highest education level: Not on file  Occupational History   Not on file  Tobacco Use   Smoking status: Former    Packs/day: 1.00    Years: 10.00    Additional pack years: 0.00    Total pack years: 10.00    Types: Cigarettes    Quit date: 08/15/1977    Years since quitting: 45.4   Smokeless tobacco: Never  Vaping Use   Vaping Use: Never used  Substance and Sexual Activity   Alcohol use: Yes    Alcohol/week: 7.0 standard drinks of alcohol    Types: 7 Shots of liquor per week   Drug use: No   Sexual activity: Yes  Other Topics Concern   Not on file  Social History Narrative   Not on file   Social Determinants of Health   Financial Resource Strain: Low Risk  (11/24/2021)   Overall Financial Resource Strain (CARDIA)    Difficulty of Paying Living Expenses: Not hard at all  Food Insecurity: No Food Insecurity (12/05/2022)   Hunger Vital Sign    Worried About Running Out of Food in the Last Year: Never true    Ran Out of Food in the Last Year: Never true  Transportation Needs: No  Transportation Needs (12/05/2022)   PRAPARE - Administrator, Civil Service (Medical): No    Lack of Transportation (Non-Medical): No  Physical Activity: Sufficiently Active (11/24/2021)   Exercise Vital Sign    Days of Exercise per Week: 5 days    Minutes of Exercise per Session: 60 min  Stress: No Stress Concern Present (11/24/2021)   [employer] of Occupational Health - Occupational Stress Questionnaire    Feeling of Stress : Not at all  Social Connections: Moderately Isolated (11/24/2021)   Social Connection and Isolation Panel [NHANES]    Frequency of Communication with Friends and Family: More than three times a week    Frequency of Social Gatherings with Friends and Family: More than three times a week     Attends Religious Services: Never    Database administrator or Organizations: No    Attends Banker Meetings: Never    Marital Status: Married  Catering manager Violence: Not At Risk (11/24/2021)   Humiliation, Afraid, Rape, and Kick questionnaire    Fear of Current or Ex-Partner: No    Emotionally Abused: No    Physically Abused: No    Sexually Abused: No    [place] native Has worked in Editor, commissioning - some Engineer, 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  No known asbestos exposure Was in the [place], no agent orange   Allergies  Allergen Reactions   Tape Other (See Comments)    Sensitivity: use paper tape     Outpatient Medications Prior to Visit  Medication Sig Dispense Refill   Colchicine 0.6 MG CAPS TAKE 1 CAPSULE BY MOUTH DAILY (Patient taking differently: Take 0.6 mg by mouth daily as needed (Gout).) 30 capsule 2   diclofenac Sodium (VOLTAREN) 1 % GEL Apply 2 g topically once a week.     metoprolol succinate (TOPROL-XL) 25 MG 24 hr tablet Take 1 tablet (25 mg total) by mouth every morning. And 1/2 tablet (12.5mg ) in the evening 180 tablet 3   rivaroxaban (XARELTO) 20 MG TABS tablet TAKE 1 TABLET EVERY DAY WITH SUPPER 90 tablet 5   rosuvastatin (CRESTOR) 10 MG tablet TAKE 1 TABLET(10 MG) BY MOUTH DAILY 30 tablet 0   tadalafil (CIALIS) 5 MG tablet Take 5 mg by mouth daily as needed for erectile dysfunction.  10   No facility-administered medications prior to visit.        Objective:   Physical Exam Vitals:   12/28/22 0853  BP: (!) 142/76  Pulse: 60  Temp: 98 F (36.7 C)  TempSrc: Oral  SpO2: 99%  Weight: 228 lb 9.6 oz (103.7 kg)  Height: 5' 10.5" (1.791 m)    Gen: Pleasant, elderly gentleman, in no distress,  normal affect  ENT: No lesions,  mouth clear,  oropharynx clear, no postnasal drip  Neck: No JVD, no stridor  Lungs: No use of accessory muscles, no crackles or wheezing on normal respiration, no wheeze on forced  expiration  Cardiovascular: RRR, heart sounds normal, no murmur or gallops, no peripheral edema  Musculoskeletal: No deformities, no cyanosis or clubbing  Neuro: alert, awake, non focal, a bit slow to respond.   Skin: Warm, no lesions or rash       Assessment & Plan:  Abnormal CT of the chest Some longstanding right middle lobe scar, has had some other waxing and waning nodular changes, none on most recent CTs.  They did make comment about some basilar atelectasis/groundglass, bronchial wall thickening  and a trace right pleural effusion of unclear significance.  Mediastinal adenopathy had resolved. We discussed the findings and agreed to repeat his CT chest in 6 months to ensure no relevant interval change.  If stable at that time we may be able to defer any further dedicated follow-up.  Dyspnea Remarkably improved after his ablation.  Plan to continue to push exercise, conditioning to enhance his functional capacity.  Consider pulmonary function testing if he backtracks.  Levy Pupa, MD, PhD 12/28/2022, 9:15 AM [facility] [phone] or if no answer [phone]

## 2022-12-28 NOTE — Patient Instructions (Signed)
We reviewed your CT scan of the chest from 11/24/2022 and your scan report from 12/01/2022. We will plan to repeat your CT chest without contrast in October 2024. Follow Dr. Delton Coombes in October after your CT so we can review those results.

## 2022-12-28 NOTE — Assessment & Plan Note (Signed)
Some longstanding right middle lobe scar, has had some other waxing and waning nodular changes, none on most recent CTs.  They did make comment about some basilar atelectasis/groundglass, bronchial wall thickening and a trace right pleural effusion of unclear significance.  Mediastinal adenopathy had resolved. We discussed the findings and agreed to repeat his CT chest in 6 months to ensure no relevant interval change.  If stable at that time we may be able to defer any further dedicated follow-up.

## 2023-01-05 LAB — CUP PACEART REMOTE DEVICE CHECK
Date Time Interrogation Session: 20240522230931
Implantable Pulse Generator Implant Date: 20220607

## 2023-01-10 ENCOUNTER — Ambulatory Visit (INDEPENDENT_AMBULATORY_CARE_PROVIDER_SITE_OTHER): Payer: Medicare Other

## 2023-01-10 ENCOUNTER — Other Ambulatory Visit: Payer: Self-pay

## 2023-01-10 DIAGNOSIS — I4819 Other persistent atrial fibrillation: Secondary | ICD-10-CM

## 2023-01-10 DIAGNOSIS — R55 Syncope and collapse: Secondary | ICD-10-CM | POA: Diagnosis not present

## 2023-01-11 NOTE — Progress Notes (Signed)
Carelink Summary Report / Loop Recorder 

## 2023-01-14 ENCOUNTER — Other Ambulatory Visit: Payer: Self-pay | Admitting: Cardiovascular Disease

## 2023-01-30 ENCOUNTER — Other Ambulatory Visit: Payer: Medicare Other

## 2023-01-31 ENCOUNTER — Encounter: Payer: Self-pay | Admitting: Family Medicine

## 2023-01-31 ENCOUNTER — Ambulatory Visit (INDEPENDENT_AMBULATORY_CARE_PROVIDER_SITE_OTHER): Payer: Medicare Other | Admitting: Family Medicine

## 2023-01-31 DIAGNOSIS — Z0000 Encounter for general adult medical examination without abnormal findings: Secondary | ICD-10-CM

## 2023-01-31 NOTE — Patient Instructions (Signed)
I really enjoyed getting to talk with you today! I am available on Tuesdays and Thursdays for virtual visits if you have any questions or concerns, or if I can be of any further assistance.   CHECKLIST FROM ANNUAL WELLNESS VISIT:  -Follow up (please call to schedule if not scheduled after visit):   -yearly for annual wellness visit with primary care office  Here is a list of your preventive care/health maintenance measures and the plan for each if any are due:  PLAN For any measures below that may be due:  -can get the covid and flu vaccines at the pharmacy, please let us know when you do so that we can update your chart  Health Maintenance  Topic Date Due   Zoster Vaccines- Shingrix (1 of 2) Never done   COVID-19 Vaccine (4 - 2023-24 season) 02/16/2023 (Originally 04/15/2022)   INFLUENZA VACCINE  03/16/2023   Medicare Annual Wellness (AWV)  01/31/2024   DTaP/Tdap/Td (2 - Tdap) 01/21/2025   Pneumonia Vaccine 50+ Years old  Completed   Hepatitis C Screening  Completed   HPV VACCINES  Aged Out   Colonoscopy  Discontinued    -See a dentist at least yearly  -Get your eyes checked and then per your eye specialist's recommendations  -Other issues addressed today:   -I have included below further information regarding a healthy whole foods based diet, physical activity guidelines for adults, stress management and opportunities for social connections. I hope you find this information useful.   -----------------------------------------------------------------------------------------------------------------------------------------------------------------------------------------------------------------------------------------------------------  NUTRITION: -eat real food: lots of colorful vegetables (half the plate) and fruits -5-7 servings of vegetables and fruits per day (fresh or steamed is best), exp. 2 servings of vegetables with lunch and dinner and 2 servings of fruit per day. Berries  and greens such as kale and collards are great choices.  -consume on a regular basis: whole grains (make sure first ingredient on label contains the word "whole"), fresh fruits, fish, nuts, seeds, healthy oils (such as olive oil, avocado oil, grape seed oil) -may eat small amounts of dairy and lean meat on occasion, but avoid processed meats such as ham, bacon, lunch meat, etc. -drink water -try to avoid fast food and pre-packaged foods, processed meat -most experts advise limiting sodium to < 2300mg  per day, should limit further is any chronic conditions such as high blood pressure, heart disease, diabetes, etc. The American Heart Association advised that < 1500mg  is is ideal -try to avoid foods that contain any ingredients with names you do not recognize  -try to avoid sugar/sweets (except for the natural sugar that occurs in fresh fruit) -try to avoid sweet drinks -try to avoid white rice, white bread, pasta (unless whole grain), white or yellow potatoes  EXERCISE GUIDELINES FOR ADULTS: -if you wish to increase your physical activity, do so gradually and with the approval of your doctor -STOP and seek medical care immediately if you have any chest pain, chest discomfort or trouble breathing when starting or increasing exercise  -move and stretch your body, legs, feet and arms when sitting for long periods -Physical activity guidelines for optimal health in adults: -least 150 minutes per week of aerobic exercise (can talk, but not sing) once approved by your doctor, 20-30 minutes of sustained activity or two 10 minute episodes of sustained activity every day.  -resistance training at least 2 days per week if approved by your doctor -balance exercises 3+ days per week:   Stand somewhere where you have something sturdy to  hold onto if you lose balance.    1) lift up on toes, start with 5x per day and work up to 20x   2) stand and lift on leg straight out to the side so that foot is a few inches  of the floor, start with 5x each side and work up to 20x each side   3) stand on one foot, start with 5 seconds each side and work up to 20 seconds on each side  If you need ideas or help with getting more active:  -Silver sneakers https://tools.silversneakers.com  -Walk with a Doc: http://www.duncan-williams.com/  -try to include resistance (weight lifting/strength building) and balance exercises twice per week: or the following link for ideas: http://castillo-powell.com/  BuyDucts.dk  STRESS MANAGEMENT: -can try meditating, or just sitting quietly with deep breathing while intentionally relaxing all parts of your body for 5 minutes daily -if you need further help with stress, anxiety or depression please follow up with your primary doctor or contact the wonderful folks at [facility]: [phone]  SOCIAL CONNECTIONS: -options in [place] if you wish to engage in more social and exercise related activities:  -Silver sneakers https://tools.silversneakers.com  -Walk with a Doc: http://www.duncan-williams.com/  -Check out the [facility] Active Adults 50+ section on the [place] [employer] (hiking clubs, book clubs, cards and games, chess, exercise classes, aquatic classes and much more) - see the website for details: https://www.[place].gov/departments/parks-recreation/active-adults50  -YouTube has lots of exercise videos for different ages and abilities as well  -[facility] (a variety of indoor and outdoor inperson activities for adults). [phone]. [address]  -Virtual Online Classes (a variety of topics): see seniorplanet.org or call [phone]  -consider volunteering at a school, hospice center, church, senior center or elsewhere

## 2023-01-31 NOTE — Progress Notes (Signed)
PATIENT CHECK-IN and HEALTH RISK ASSESSMENT QUESTIONNAIRE:  -completed by phone/video for upcoming Medicare Preventive Visit  Pre-Visit Check-in: 1)Vitals (height, wt, BP, etc) - record in vitals section for visit on day of visit 2)Review and Update Medications, Allergies PMH, Surgeries, Social history in Epic 3)Hospitalizations in the last year with date/reason? Yes 12/01/22 for chest pain - fully recovered from this 4)Review and Update Care Team (patient's specialists) in Epic 5) Complete PHQ9 in Epic  6) Complete Fall Screening in Epic 7)Review all Health Maintenance Due and order under PCP if not done.  Medicare Wellness Patient Questionnaire:  Answer theses question about your habits: Do you drink alcohol? Occasional If yes, how many drinks do you have a day? Once per day Have you ever smoked? Yes Quit date if applicable? 1979  How many packs a day do/did you smoke? 1/2 pack per day Do you use smokeless tobacco? No  Do you use an illicit drugs? No Do you exercises? Gardening IF so, what type and how many days/minutes per week? 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 Yes Number of partners?1 Eating lots of veggies Typical breakfast scrambled eggs, fruit and coffee Typical lunch sandwich  Typical dinner Vegetables, occasional with meat Typical snacks: granola bars  Beverages: coffee, water or Gatorade  Answer theses question about you: Can you perform most household chores? Yes Do you find it hard to follow a conversation in a noisy room? No Do you often ask people to speak up or repeat themselves? No Do you feel that you have a problem with memory? No Do you balance your checkbook and or bank acounts? Yes Do you feel safe at home? Yes Last dentist visit? 3 months, upcoming appt in the next few weeks Do you need assistance with any of the following: Please note if so No  Driving? No  Feeding yourself? No  Getting from  bed to chair? No  Getting to the toilet? No  Bathing or showering? No  Dressing yourself? No  Managing money? No  Climbing a flight of stairs No  Preparing meals? No    Do you have Advanced Directives in place (Living Will, Healthcare Power or Attorney)? Yes   Last eye Exam and location? October 2023 , [employer]   Do you currently use prescribed or non-prescribed narcotic or opioid pain medications? No  Do you have a history or close family history of breast, ovarian, tubal or peritoneal cancer or a family member with BRCA (breast cancer susceptibility 1 and 2) gene mutations? Unsure  Nurse/Assistant Credentials/time stamp: bds CMA 01/31/23 4:45pm   ----------------------------------------------------------------------------------------------------------------------------------------------------------------------------------------------------------------------    MEDICARE ANNUAL PREVENTIVE CARE VISIT WITH PROVIDER (Welcome to [facility], initial annual wellness or annual wellness exam)  Virtual Visit via Phone Note  I connected with Joe Davis on 01/31/23  by phone and verified that I am speaking with the correct person using two identifiers.  Location patient: home Location provider:work or home office Persons participating in the virtual visit: patient, provider  Concerns and/or follow up today: Doing well. Had a cold or virus 2 weeks ago - now much better other than occ cough. Reports doing well now. No concerns.    See HM section in Epic for other details of completed HM.    ROS: negative for report of fevers, unintentional weight loss, vision changes, vision loss, hearing loss or change, chest pain, sob, hemoptysis, melena, hematochezia, hematuria, falls, bleeding or bruising, thoughts  of suicide or self harm, memory loss  Patient-completed extensive health risk assessment - reviewed and discussed with the patient: See Health Risk Assessment completed with  patient prior to the visit either above or in recent phone note. This was reviewed in detailed with the patient today and appropriate recommendations, orders and referrals were placed as needed per Summary below and patient instructions.   Review of Medical History: -PMH, PSH, Family History and current specialty and care providers reviewed and updated and listed below   Patient Care Team: Kristian Covey, MD as PCP - General (Family Medicine) Wendall Stade, MD as PCP - Cardiology (Cardiology)   Past Medical History:  Diagnosis Date   Arthritis    "knees, hips" (10/10/2014)   Basal cell carcinoma    BPH (benign prostatic hyperplasia)    Bradycardia vasovagal response 10 yrs ago   asymptomatic   Complication of anesthesia    bradycardia with spinal   Coronary artery disease    Diastolic dysfunction    Per echo in 2008   DVT (deep venous thrombosis) (HCC) 08/15/1977   RLE   Erectile dysfunction    History of gout    History of nuclear stress test    ETT-Myoview 3/17: Hypertensive blood pressure response to exercise, normal perfusion, EF 60%, low risk study   Hypercholesterolemia    ? statin intolerant. May tolerate Lipitor   Normal nuclear stress test 12/13/2009   Obesity    Pulmonary embolism (HCC) 08/15/1977   Syncope and collapse 8-10 yrs ago    Past Surgical History:  Procedure Laterality Date   ATRIAL FIBRILLATION ABLATION N/A 11/29/2022   Procedure: ATRIAL FIBRILLATION ABLATION;  Surgeon: Lanier Prude, MD;  [facility]: [facility];  Service: Cardiovascular;  Laterality: N/A;   CARDIOVASCULAR STRESS TEST  12/13/2009   EF 73%; No ischemia   CARDIOVERSION N/A 09/05/2022   Procedure: CARDIOVERSION;  Surgeon: Thomasene Ripple, DO;  Location: [facility];  Service: Cardiovascular;  Laterality: N/A;   COLONOSCOPY  08/16/2011   COLONOSCOPY WITH PROPOFOL N/A 05/18/2022   Procedure: COLONOSCOPY WITH PROPOFOL;  Surgeon: Willis Modena, MD;  Location: [facility];   Service: Gastroenterology;  Laterality: N/A;   IVC FILTER INSERTION N/A 10/06/2020   Procedure: IVC FILTER INSERTION;  Surgeon: Nada Libman, MD;  Location: [facility];  Service: Cardiovascular;  Laterality: N/A;  w/ IVC Venogram   IVC FILTER REMOVAL N/A 01/05/2021   Procedure: IVC FILTER REMOVAL;  Surgeon: Nada Libman, MD;  Location: [facility];  Service: Cardiovascular;  Laterality: N/A;   JOINT REPLACEMENT     KNEE ARTHROSCOPY Right 10/03/2012   Procedure: RIGHT KNEE ARTHROSCOPY WITH DEBRIDEMENT;  Surgeon: Loanne Drilling, MD;  Location: [facility];  Service: Orthopedics;  Laterality: Right;  WITH DEBRIDEMENT   LOOP RECORDER IMPLANT Left    MOHS SURGERY  6 yrs    nose   POLYPECTOMY  05/18/2022   Procedure: POLYPECTOMY;  Surgeon: Willis Modena, MD;  Location: [facility];  Service: Gastroenterology;;   SKIN CANCER EXCISION     left upper arm, biopsy   TOTAL HIP ARTHROPLASTY Right 10/06/2014    Social History   Socioeconomic History   Marital status: Married    Spouse name: Not on file   Number of children: Not on file   Years of education: Not on file   Highest education level: Not on file  Occupational History   Not on file  Tobacco Use   Smoking status:  Former    Packs/day: 1.00    Years: 10.00    Additional pack years: 0.00    Total pack years: 10.00    Types: Cigarettes    Quit date: 08/15/1977    Years since quitting: 45.4   Smokeless tobacco: Never  Vaping Use   Vaping Use: Never used  Substance and Sexual Activity   Alcohol use: Yes    Alcohol/week: 7.0 standard drinks of alcohol    Types: 7 Shots of liquor per week   Drug use: No   Sexual activity: Yes  Other Topics Concern   Not on file  Social History Narrative   Not on file   Social Determinants of Health   Financial Resource Strain: Low Risk  (11/24/2021)   Overall Financial Resource Strain (CARDIA)    Difficulty of Paying Living Expenses: Not hard at all  Food Insecurity: No  Food Insecurity (12/05/2022)   Hunger Vital Sign    Worried About Running Out of Food in the Last Year: Never true    Ran Out of Food in the Last Year: Never true  Transportation Needs: No Transportation Needs (12/05/2022)   PRAPARE - Administrator, Civil Service (Medical): No    Lack of Transportation (Non-Medical): No  Physical Activity: Sufficiently Active (11/24/2021)   Exercise Vital Sign    Days of Exercise per Week: 5 days    Minutes of Exercise per Session: 60 min  Stress: No Stress Concern Present (11/24/2021)   [employer] of Occupational Health - Occupational Stress Questionnaire    Feeling of Stress : Not at all  Social Connections: Moderately Isolated (11/24/2021)   Social Connection and Isolation Panel [NHANES]    Frequency of Communication with Friends and Family: More than three times a week    Frequency of Social Gatherings with Friends and Family: More than three times a week    Attends Religious Services: Never    Database administrator or Organizations: No    Attends Banker Meetings: Never    Marital Status: Married  Catering manager Violence: Not At Risk (11/24/2021)   Humiliation, Afraid, Rape, and Kick questionnaire    Fear of Current or Ex-Partner: No    Emotionally Abused: No    Physically Abused: No    Sexually Abused: No    Family History  Adopted: Yes  Family history unknown: Yes    Current Outpatient Medications on File Prior to Visit  Medication Sig Dispense Refill   Colchicine 0.6 MG CAPS TAKE 1 CAPSULE BY MOUTH DAILY (Patient taking differently: Take 0.6 mg by mouth daily as needed (Gout).) 30 capsule 2   diclofenac Sodium (VOLTAREN) 1 % GEL Apply 2 g topically once a week.     metoprolol succinate (TOPROL-XL) 25 MG 24 hr tablet Take 1 tablet (25 mg total) by mouth every morning. And 1/2 tablet (12.5mg ) in the evening 180 tablet 3   rivaroxaban (XARELTO) 20 MG TABS tablet TAKE 1 TABLET EVERY DAY WITH SUPPER 90 tablet 5    rosuvastatin (CRESTOR) 10 MG tablet TAKE 1 TABLET(10 MG) BY MOUTH DAILY 30 tablet 0   tadalafil (CIALIS) 5 MG tablet Take 5 mg by mouth daily as needed for erectile dysfunction.  10   No current facility-administered medications on file prior to visit.    Allergies  Allergen Reactions   Tape Other (See Comments)    Sensitivity: use paper tape       Physical Exam There were no vitals  filed for this visit. Estimated body mass index is 32.34 kg/m as calculated from the following:   Height as of 12/28/22: 5' 10.5" (1.791 m).   Weight as of 12/28/22: 228 lb 9.6 oz (103.7 kg).  EKG (optional): deferred due to virtual visit  GENERAL: alert, oriented, no acute distress detected; full vision exam deferred due to pandemic and/or virtual encounter  PSYCH/NEURO: pleasant and cooperative, no obvious depression or anxiety, speech and thought processing grossly intact, Cognitive function grossly intact  Flowsheet Row Office Visit from 11/30/2022 in [facility] [place]  PHQ-9 Total Score 6           01/31/2023    5:09 PM 11/30/2022    4:14 PM 11/24/2021    2:53 PM 11/16/2021   10:48 AM 11/18/2020    1:52 PM  Depression screen PHQ 2/9  Decreased Interest 0 1 0 0 0  Down, Depressed, Hopeless 0 1 0 0 0  PHQ - 2 Score 0 2 0 0 0  Altered sleeping  1 0 0   Tired, decreased energy  1 0 0   Change in appetite  1 0 0   Feeling bad or failure about yourself   0 0 0   Trouble concentrating  0 0 0   Moving slowly or fidgety/restless  1 0 0   Suicidal thoughts  0 0 0   PHQ-9 Score  6 0 0        11/16/2021   10:48 AM 11/24/2021    2:57 PM 05/18/2022    8:17 AM 11/30/2022    4:14 PM 01/31/2023    5:09 PM  Fall Risk  Falls in the past year? 0 0  0 0  Was there an injury with Fall? 0 0  0 0  Fall Risk Category Calculator 0 0  0 0  Fall Risk Category (Retired) Low Low     (RETIRED) Patient Fall Risk Level Low fall risk Low fall risk Low fall risk    Patient at Risk for  Falls Due to No Fall Risks No Fall Risks  No Fall Risks No Fall Risks  Fall risk Follow up Falls evaluation completed   Falls evaluation completed      SUMMARY AND PLAN:  Encounter for Medicare annual wellness exam    Discussed applicable health maintenance/preventive health measures and advised and referred or ordered per patient preferences: -discussed covid and shingles vaccine recs. Advised can get at the pharmacy and to please let us know when does so that we can update in the chart.  Health Maintenance  Topic Date Due   Zoster Vaccines- Shingrix (1 of 2) Never done   COVID-19 Vaccine (4 - 2023-24 season) 02/16/2023 (Originally 04/15/2022)   INFLUENZA VACCINE  03/16/2023   Medicare Annual Wellness (AWV)  01/31/2024   DTaP/Tdap/Td (2 - Tdap) 01/21/2025   Pneumonia Vaccine 80+ Years old  Completed   Hepatitis C Screening  Completed   HPV VACCINES  Aged Out   Colonoscopy  Discontinued     Education and counseling on the following was provided based on the above review of health and a plan/checklist for the patient, along with additional information discussed, was provided for the patient in the patient instructions :  -advised to follow up if cough does not fully resolve over the next week or so or any other symptoms.   -Advised and counseled on a healthy lifestyle - including the importance of a healthy diet, regular physical activity, social  connections and stress management. -Reviewed patient's current diet. Advised and counseled on a whole foods based healthy diet. A summary of a healthy diet was provided in the Patient Instructions.  -reviewed patient's current physical activity level and discussed exercise guidelines for adults. Congratulated on activity level and encouraged to continue.  -Advise yearly dental visits at minimum and regular eye exams -Advised and counseled on alcohol safe limits, risks  Follow up: see patient instructions   Patient Instructions  I really  enjoyed getting to talk with you today! I am available on Tuesdays and Thursdays for virtual visits if you have any questions or concerns, or if I can be of any further assistance.   CHECKLIST FROM ANNUAL WELLNESS VISIT:  -Follow up (please call to schedule if not scheduled after visit):   -yearly for annual wellness visit with primary care office  Here is a list of your preventive care/health maintenance measures and the plan for each if any are due:  PLAN For any measures below that may be due:  -can get the covid and flu vaccines at the pharmacy, please let us know when you do so that we can update your chart  Health Maintenance  Topic Date Due   Zoster Vaccines- Shingrix (1 of 2) Never done   COVID-19 Vaccine (4 - 2023-24 season) 02/16/2023 (Originally 04/15/2022)   INFLUENZA VACCINE  03/16/2023   Medicare Annual Wellness (AWV)  01/31/2024   DTaP/Tdap/Td (2 - Tdap) 01/21/2025   Pneumonia Vaccine 75+ Years old  Completed   Hepatitis C Screening  Completed   HPV VACCINES  Aged Out   Colonoscopy  Discontinued    -See a dentist at least yearly  -Get your eyes checked and then per your eye specialist's recommendations  -Other issues addressed today:   -I have included below further information regarding a healthy whole foods based diet, physical activity guidelines for adults, stress management and opportunities for social connections. I hope you find this information useful.   -----------------------------------------------------------------------------------------------------------------------------------------------------------------------------------------------------------------------------------------------------------  NUTRITION: -eat real food: lots of colorful vegetables (half the plate) and fruits -5-7 servings of vegetables and fruits per day (fresh or steamed is best), exp. 2 servings of vegetables with lunch and dinner and 2 servings of fruit per day. Berries and  greens such as kale and collards are great choices.  -consume on a regular basis: whole grains (make sure first ingredient on label contains the word "whole"), fresh fruits, fish, nuts, seeds, healthy oils (such as olive oil, avocado oil, grape seed oil) -may eat small amounts of dairy and lean meat on occasion, but avoid processed meats such as ham, bacon, lunch meat, etc. -drink water -try to avoid fast food and pre-packaged foods, processed meat -most experts advise limiting sodium to < 2300mg  per day, should limit further is any chronic conditions such as high blood pressure, heart disease, diabetes, etc. The American Heart Association advised that < 1500mg  is is ideal -try to avoid foods that contain any ingredients with names you do not recognize  -try to avoid sugar/sweets (except for the natural sugar that occurs in fresh fruit) -try to avoid sweet drinks -try to avoid white rice, white bread, pasta (unless whole grain), white or yellow potatoes  EXERCISE GUIDELINES FOR ADULTS: -if you wish to increase your physical activity, do so gradually and with the approval of your doctor -STOP and seek medical care immediately if you have any chest pain, chest discomfort or trouble breathing when starting or increasing exercise  -move and stretch  your body, legs, feet and arms when sitting for long periods -Physical activity guidelines for optimal health in adults: -least 150 minutes per week of aerobic exercise (can talk, but not sing) once approved by your doctor, 20-30 minutes of sustained activity or two 10 minute episodes of sustained activity every day.  -resistance training at least 2 days per week if approved by your doctor -balance exercises 3+ days per week:   Stand somewhere where you have something sturdy to hold onto if you lose balance.    1) lift up on toes, start with 5x per day and work up to 20x   2) stand and lift on leg straight out to the side so that foot is a few inches of  the floor, start with 5x each side and work up to 20x each side   3) stand on one foot, start with 5 seconds each side and work up to 20 seconds on each side  If you need ideas or help with getting more active:  -Silver sneakers https://tools.silversneakers.com  -Walk with a Doc: http://www.duncan-williams.com/  -try to include resistance (weight lifting/strength building) and balance exercises twice per week: or the following link for ideas: http://castillo-powell.com/  BuyDucts.dk  STRESS MANAGEMENT: -can try meditating, or just sitting quietly with deep breathing while intentionally relaxing all parts of your body for 5 minutes daily -if you need further help with stress, anxiety or depression please follow up with your primary doctor or contact the wonderful folks at [facility]: [phone]  SOCIAL CONNECTIONS: -options in [place] if you wish to engage in more social and exercise related activities:  -Silver sneakers https://tools.silversneakers.com  -Walk with a Doc: http://www.duncan-williams.com/  -Check out the [facility] Active Adults 50+ section on the [place] of [employer] (hiking clubs, book clubs, cards and games, chess, exercise classes, aquatic classes and much more) - see the website for details: https://www.[place].gov/departments/parks-recreation/active-adults50  -YouTube has lots of exercise videos for different ages and abilities as well  -[facility] (a variety of indoor and outdoor inperson activities for adults). 248-372-4384. [address]  -Virtual Online Classes (a variety of topics): see seniorplanet.org or call (281) [phone]  -consider volunteering at a school, hospice center, church, senior center or elsewhere           Terressa Koyanagi, DO

## 2023-02-03 NOTE — Progress Notes (Signed)
Carelink Summary Report / Loop Recorder 

## 2023-02-07 LAB — CUP PACEART REMOTE DEVICE CHECK
Date Time Interrogation Session: 20240624230424
Implantable Pulse Generator Implant Date: 20220607

## 2023-02-13 ENCOUNTER — Encounter (HOSPITAL_COMMUNITY): Payer: Self-pay

## 2023-02-13 ENCOUNTER — Ambulatory Visit (HOSPITAL_COMMUNITY): Admit: 2023-02-13 | Payer: Medicare Other | Admitting: Cardiology

## 2023-02-13 ENCOUNTER — Ambulatory Visit (INDEPENDENT_AMBULATORY_CARE_PROVIDER_SITE_OTHER): Payer: Medicare Other

## 2023-02-13 DIAGNOSIS — R55 Syncope and collapse: Secondary | ICD-10-CM

## 2023-02-13 SURGERY — ATRIAL FIBRILLATION ABLATION
Anesthesia: General

## 2023-02-24 ENCOUNTER — Other Ambulatory Visit: Payer: Self-pay | Admitting: Cardiovascular Disease

## 2023-03-06 NOTE — Progress Notes (Signed)
Carelink Summary Report / Loop Recorder 

## 2023-03-16 ENCOUNTER — Ambulatory Visit: Payer: Medicare Other | Attending: Cardiology | Admitting: Cardiology

## 2023-03-16 ENCOUNTER — Encounter: Payer: Self-pay | Admitting: Cardiology

## 2023-03-16 NOTE — Progress Notes (Deleted)
  Electrophysiology Office Follow up Visit Note:    Date:  03/16/2023   ID:  Joe Davis, DOB [birth year], MRN [id number]  PCP:  Kristian Covey, MD  [facility] Cardiologist:  Charlton Haws, MD  [facility] Electrophysiologist:  None    Interval History:    Joe Davis is a 77 y.o. male who presents for a follow up visit.   He had an A-fib ablation November 29, 2022 during which the pulmonary veins were isolated.       Past medical, surgical, social and family history were reviewed.  ROS:   Please see the history of present illness.    All other systems reviewed and are negative.  EKGs/Labs/Other Studies Reviewed:    The following studies were reviewed today:   Loop recorder interrogation on June 25         Physical Exam:    VS:  There were no vitals taken for this visit.    Wt Readings from Last 3 Encounters:  12/28/22 228 lb 9.6 oz (103.7 kg)  12/27/22 224 lb (101.6 kg)  11/30/22 234 lb (106.1 kg)     GEN:  Well nourished, well developed in no acute distress CARDIAC: RRR, no murmurs, rubs, gallops RESPIRATORY:  Clear to auscultation without rales, wheezing or rhonchi       ASSESSMENT:    1. Persistent atrial fibrillation (HCC)   2. Syncope and collapse    PLAN:    In order of problems listed above:   #Persistent atrial fibrillation Doing well after his catheter ablation in April 2024.  No sustained episodes.  See above for loop recorder trend. Continue Xarelto and metoprolol.   #Syncope No recurrent episodes.   Signed, Steffanie Dunn, MD, [facility], [facility] 03/16/2023 10:43 AM    Electrophysiology [facility]

## 2023-03-20 ENCOUNTER — Ambulatory Visit: Payer: Medicare Other

## 2023-03-20 DIAGNOSIS — R55 Syncope and collapse: Secondary | ICD-10-CM

## 2023-03-26 ENCOUNTER — Other Ambulatory Visit: Payer: Self-pay | Admitting: Cardiovascular Disease

## 2023-03-28 DIAGNOSIS — H401332 Pigmentary glaucoma, bilateral, moderate stage: Secondary | ICD-10-CM | POA: Diagnosis not present

## 2023-03-30 ENCOUNTER — Other Ambulatory Visit (HOSPITAL_BASED_OUTPATIENT_CLINIC_OR_DEPARTMENT_OTHER): Payer: Self-pay

## 2023-04-04 NOTE — Progress Notes (Signed)
Carelink Summary Report / Loop Recorder 

## 2023-04-24 ENCOUNTER — Ambulatory Visit (INDEPENDENT_AMBULATORY_CARE_PROVIDER_SITE_OTHER): Payer: Medicare Other

## 2023-04-24 DIAGNOSIS — R55 Syncope and collapse: Secondary | ICD-10-CM

## 2023-04-24 LAB — CUP PACEART REMOTE DEVICE CHECK
Date Time Interrogation Session: 20240906230551
Implantable Pulse Generator Implant Date: 20220607

## 2023-05-10 DIAGNOSIS — M79674 Pain in right toe(s): Secondary | ICD-10-CM | POA: Diagnosis not present

## 2023-05-11 NOTE — Progress Notes (Signed)
Carelink Summary Report / Loop Recorder 

## 2023-05-12 ENCOUNTER — Other Ambulatory Visit (HOSPITAL_COMMUNITY): Payer: Self-pay

## 2023-05-26 ENCOUNTER — Ambulatory Visit
Admission: RE | Admit: 2023-05-26 | Discharge: 2023-05-26 | Disposition: A | Discharge status: Home / Self Care | Payer: Medicare Other | Source: Ambulatory Visit | Attending: Emergency Medicine | Admitting: Emergency Medicine

## 2023-05-26 DIAGNOSIS — R918 Other nonspecific abnormal finding of lung field: Secondary | ICD-10-CM

## 2023-05-26 DIAGNOSIS — R911 Solitary pulmonary nodule: Secondary | ICD-10-CM | POA: Diagnosis not present

## 2023-05-29 ENCOUNTER — Ambulatory Visit (INDEPENDENT_AMBULATORY_CARE_PROVIDER_SITE_OTHER): Payer: Medicare Other

## 2023-05-29 DIAGNOSIS — R55 Syncope and collapse: Secondary | ICD-10-CM

## 2023-05-30 LAB — CUP PACEART REMOTE DEVICE CHECK
Date Time Interrogation Session: 20241013231018
Implantable Pulse Generator Implant Date: 20220607

## 2023-05-31 ENCOUNTER — Encounter: Payer: Self-pay | Admitting: Emergency Medicine

## 2023-05-31 ENCOUNTER — Ambulatory Visit (INDEPENDENT_AMBULATORY_CARE_PROVIDER_SITE_OTHER): Payer: Medicare Other | Admitting: Emergency Medicine

## 2023-05-31 VITALS — BP 140/70 | HR 87 | Temp 97.4°F | Ht 70.5 in | Wt 230.0 lb

## 2023-05-31 DIAGNOSIS — R9389 Abnormal findings on diagnostic imaging of other specified body structures: Secondary | ICD-10-CM | POA: Diagnosis not present

## 2023-05-31 DIAGNOSIS — Z23 Encounter for immunization: Secondary | ICD-10-CM

## 2023-05-31 NOTE — Progress Notes (Signed)
Subjective:    Patient ID: Joe Davis, male    DOB: [birth year], 77 y.o.   MRN: [id number]  HPI  ROV 12/28/2022 --follow-up visit for 77 year old man with history of remote PE/VTE, hypertension with diastolic dysfunction, OSA not on CPAP, atrial fibrillation.  He underwent ablation 11/29/2022. I have followed him for an abnormal CT scan of the chest with some subpleural right middle lobe scar, waxing and waning other nodules.  He reports today that overall he has felt better since the ablation, although he had an episode of CP that had to be evaluated at [facility]. Reassuring eval.   CT cardiac morphology study 11/24/2022 reviewed by me showed some prominent right hilar nodes, 14 mm, shotty subcarinal node unchanged.  Stable peripheral scarring in the right middle lobe without any evidence of other airspace disease.  CT-PA 12/01/2022 [facility]), report available, shows some bibasilar groundglass opacity consistent with atelectasis, some bronchial wall thickening, trace right pleural effusion.  No significant mediastinal adenopathy  ROV 05/31/2023--77 year old gentleman with a history of remote PE/VTE, hypertension with diastolic dysfunction, OSA not on CPAP, A-fib (ablated 11/2022).  I been following him for some subpleural right middle lobe scar and waxing and waning nodular disease on CT scan of the chest.  CT chest from 11/2022 at [facility] showed some basilar groundglass opacities suggestive of atelectasis, some bronchial wall thickening and a trace right pleural effusion without any mediastinal adenopathy.  We decided to repeat a CT chest at the 63-month mark. Today he reports  CT chest 05/26/2023 reviewed by me, shows persistent subpleural right middle lobe scar, no significant mediastinal adenopathy, no concerning pulmonary nodules.   Review of Systems As per HPI  Past Medical History:  Diagnosis Date   Arthritis    "knees, hips" (10/10/2014)   Basal cell carcinoma    BPH (benign prostatic  hyperplasia)    Bradycardia vasovagal response 10 yrs ago   asymptomatic   Complication of anesthesia    bradycardia with spinal   Coronary artery disease    Diastolic dysfunction    Per echo in 2008   DVT (deep venous thrombosis) (HCC) 08/15/1977   RLE   Erectile dysfunction    History of gout    History of nuclear stress test    ETT-Myoview 3/17: Hypertensive blood pressure response to exercise, normal perfusion, EF 60%, low risk study   Hypercholesterolemia    ? statin intolerant. May tolerate Lipitor   Normal nuclear stress test 12/13/2009   Obesity    Pulmonary embolism (HCC) 08/15/1977   Syncope and collapse 8-10 yrs ago     Family History  Adopted: Yes  Family history unknown: Yes     Social History   Socioeconomic History   Marital status: Married    Spouse name: Not on file   Number of children: Not on file   Years of education: Not on file   Highest education level: Not on file  Occupational History   Not on file  Tobacco Use   Smoking status: Former    Current packs/day: 0.00    Average packs/day: 1 pack/day for 10.0 years (10.0 ttl pk-yrs)    Types: Cigarettes    Start date: 08/16/1967    Quit date: 08/15/1977    Years since quitting: 45.8   Smokeless tobacco: Never  Vaping Use   Vaping status: Never Used  Substance and Sexual Activity   Alcohol use: Yes    Alcohol/week: 7.0 standard drinks of alcohol    Types:  7 Shots of liquor per week   Drug use: No   Sexual activity: Yes  Other Topics Concern   Not on file  Social History Narrative   Not on file   Social Determinants of Health   Financial Resource Strain: Low Risk  (11/24/2021)   Overall Financial Resource Strain (CARDIA)    Difficulty of Paying Living Expenses: Not hard at all  Food Insecurity: No Food Insecurity (12/05/2022)   Hunger Vital Sign    Worried About Running Out of Food in the Last Year: Never true    Ran Out of Food in the Last Year: Never true  Transportation Needs: No  Transportation Needs (12/05/2022)   PRAPARE - Administrator, Civil Service (Medical): No    Lack of Transportation (Non-Medical): No  Physical Activity: Sufficiently Active (11/24/2021)   Exercise Vital Sign    Days of Exercise per Week: 5 days    Minutes of Exercise per Session: 60 min  Stress: No Stress Concern Present (11/24/2021)   [employer] of Occupational Health - Occupational Stress Questionnaire    Feeling of Stress : Not at all  Social Connections: Moderately Isolated (11/24/2021)   Social Connection and Isolation Panel [NHANES]    Frequency of Communication with Friends and Family: More than three times a week    Frequency of Social Gatherings with Friends and Family: More than three times a week    Attends Religious Services: Never    Database administrator or Organizations: No    Attends Banker Meetings: Never    Marital Status: Married  Catering manager Violence: Not At Risk (11/24/2021)   Humiliation, Afraid, Rape, and Kick questionnaire    Fear of Current or Ex-Partner: No    Emotionally Abused: No    Physically Abused: No    Sexually Abused: No    [place] native Has worked in Editor, commissioning - some Engineer, 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  No known asbestos exposure Was in the [place], no agent orange   Allergies  Allergen Reactions   Tape Other (See Comments)    Sensitivity: use paper tape     Outpatient Medications Prior to Visit  Medication Sig Dispense Refill   Colchicine 0.6 MG CAPS TAKE 1 CAPSULE BY MOUTH DAILY (Patient taking differently: Take 0.6 mg by mouth daily as needed (Gout).) 30 capsule 2   diclofenac Sodium (VOLTAREN) 1 % GEL Apply 2 g topically once a week.     metoprolol succinate (TOPROL-XL) 25 MG 24 hr tablet Take 1 tablet (25 mg total) by mouth every morning. And 1/2 tablet (12.5mg ) in the evening 180 tablet 3   rivaroxaban (XARELTO) 20 MG TABS tablet TAKE 1 TABLET EVERY DAY WITH  SUPPER 90 tablet 5   rosuvastatin (CRESTOR) 10 MG tablet TAKE 1 TABLET(10 MG) BY MOUTH DAILY 15 tablet 0   tadalafil (CIALIS) 5 MG tablet Take 5 mg by mouth daily as needed for erectile dysfunction.  10   No facility-administered medications prior to visit.        Objective:   Physical Exam Vitals:   05/31/23 1053 05/31/23 1055  BP: (!) 146/78 (!) 140/70  Pulse: 87   Temp: (!) 97.4 F (36.3 C)   TempSrc: Temporal   SpO2: 98%   Weight: 230 lb (104.3 kg)   Height: 5' 10.5" (1.791 m)     Gen: Pleasant, elderly gentleman, in no distress,  normal affect  ENT: No lesions,  mouth clear,  oropharynx clear, no postnasal drip  Neck: No JVD, no stridor  Lungs: No use of accessory muscles, no crackles or wheezing on normal respiration, no wheeze on forced expiration  Cardiovascular: RRR, heart sounds normal, no murmur or gallops, no peripheral edema  Musculoskeletal: No deformities, no cyanosis or clubbing  Neuro: alert, awake, non focal, a bit slow to respond.   Skin: Warm, no lesions or rash       Assessment & Plan:  Abnormal CT of the chest Unchanged subpleural right middle lobe basilar scarring, some scattered pleural thickening without any mediastinal adenopathy or any concerning nodules.  Overall CT chest reassuring.  We discussed this today.  We will plan to repeat his CT in 1 year to ensure no interval change   Levy Pupa, MD, PhD 05/31/2023, 1:01 PM [facility] [phone] or if no answer [phone]

## 2023-05-31 NOTE — Patient Instructions (Signed)
We reviewed your CT scan of the chest from 05/26/2023.  This is stable compared with your priors.  Good news.  No significant lymph nodes or pulmonary nodules. We will plan to repeat your CT chest in October 2025. Please follow Dr. Delton Coombes in October 2025 to review that scan.  Call sooner if you have any problems.

## 2023-05-31 NOTE — Assessment & Plan Note (Signed)
Unchanged subpleural right middle lobe basilar scarring, some scattered pleural thickening without any mediastinal adenopathy or any concerning nodules.  Overall CT chest reassuring.  We discussed this today.  We will plan to repeat his CT in 1 year to ensure no interval change

## 2023-06-13 NOTE — Progress Notes (Signed)
Carelink Summary Report / Loop Recorder 

## 2023-07-03 ENCOUNTER — Ambulatory Visit: Payer: Medicare Other

## 2023-07-03 DIAGNOSIS — R55 Syncope and collapse: Secondary | ICD-10-CM | POA: Diagnosis not present

## 2023-07-03 LAB — CUP PACEART REMOTE DEVICE CHECK
Date Time Interrogation Session: 20241117230920
Implantable Pulse Generator Implant Date: 20220607

## 2023-07-27 NOTE — Progress Notes (Signed)
Carelink Summary Report / Loop Recorder 

## 2023-08-05 ENCOUNTER — Other Ambulatory Visit: Payer: Self-pay | Admitting: Physician Assistant

## 2023-08-07 ENCOUNTER — Ambulatory Visit: Payer: Medicare Other

## 2023-08-07 DIAGNOSIS — R55 Syncope and collapse: Secondary | ICD-10-CM

## 2023-08-07 LAB — CUP PACEART REMOTE DEVICE CHECK
Date Time Interrogation Session: 20241222230900
Implantable Pulse Generator Implant Date: 20220607

## 2023-08-07 NOTE — Telephone Encounter (Signed)
Prescription refill request for Xarelto received.  Indication:afib Last office visit:5/24 Weight:104.3  kg Age:77 Scr:1.1  4/24 CrCl:80.33  ml/min  Prescription refilled

## 2023-08-11 NOTE — Progress Notes (Signed)
 Electrophysiology Office Note:   Date:  08/15/2023  ID:  Joe Davis, DOB 05/13/1946, MRN [id number]  Primary Cardiologist: Maude Emmer, MD Primary Heart Failure: None Electrophysiologist: OLE ONEIDA HOLTS, MD      History of Present Illness:   Joe Davis is a 77 y.o. male with h/o AF, syncope s/p ILR, CAD, HTN, DVT/PE (post IVC filter removal) seen today for routine electrophysiology followup.    The patient underwent AF ablation 11/29/2022 per Dr. Holts.  He has an ILR for monitoring.   Since last being seen in our clinic the patient reports he has had a few episodes where he over indulges with food and gets indigestion type symptoms and thinks he might have had episodes of an irregular rhythm (per his wife who was a SCIENTIST, CLINICAL (HISTOCOMPATIBILITY AND IMMUNOGENETICS)).  She notes his pulse is irregular at times. They have not used his symptom activator. He reports he will belch and symptoms improve.  He has felt so much better since his ablation overall > energy levels have improved and he has not felt like he did when he was in AF.   He denies chest pain, palpitations, dyspnea, PND, orthopnea, nausea, vomiting, dizziness, syncope, edema, weight gain, or early satiety.   Review of systems complete and found to be negative unless listed in HPI.   EP Information / Studies Reviewed:    EKG is ordered today. Personal review as below.  EKG Interpretation Date/Time:  Tuesday August 15 2023 14:33:12 EST Ventricular Rate:  52 PR Interval:  192 QRS Duration:  106 QT Interval:  420 QTC Calculation: 390 R Axis:   -60  Text Interpretation: Sinus bradycardia Incomplete right bundle branch block Left anterior fasicular block Confirmed by Aniceto Jarvis ([id number]) on 08/15/2023 2:40:23 PM   Studies:  ECHO 09/2019 > LVEF 55-60%, G1DD Gated Perfusion with Lexiscan  12/13/21 > low risk study  Arrhythmia / AAD AF > s/p MDT Linq II ILR 01/19/21 AF Ablation 11/29/22     Risk Assessment/Calculations:    CHA2DS2-VASc Score = 4    This indicates a 4.8% annual risk of stroke. The patient's score is based upon: CHF History: 0 HTN History: 1 Diabetes History: 0 Stroke History: 0 Vascular Disease History: 1 Age Score: 2 Gender Score: 0             Physical Exam:   VS:  BP 130/60 (BP Location: Left Arm, Patient Position: Sitting, Cuff Size: Large)   Pulse (!) 52   Ht 5' 10.5 (1.791 m)   Wt 230 lb (104.3 kg)   SpO2 97%   BMI 32.54 kg/m    Wt Readings from Last 3 Encounters:  08/15/23 230 lb (104.3 kg)  05/31/23 230 lb (104.3 kg)  12/28/22 228 lb 9.6 oz (103.7 kg)     GEN: Well nourished, well developed in no acute distress NECK: No JVD; No carotid bruits CARDIAC: Regular rate and rhythm, no murmurs, rubs, gallops RESPIRATORY:  Clear to auscultation without rales, wheezing or rhonchi  ABDOMEN: Soft, non-tender, non-distended EXTREMITIES:  No edema; No deformity   ASSESSMENT AND PLAN:    Persistent Atrial Fibrillation  S/p ablation 11/2022. CHA2DS2-VASc 4.  -OAC for stroke prophylaxis  -EKG with NSR  -continue metoprolol   -ILR reviewed via [facility] and interrogated for further activity since pt reported recent episodes > no AF detected. PVC monitoring turned on. Specifically no AF episodes since 08/04/23 when pt felt episodes of irregular rhythm.  Secondary Hypercoagulable State  -continue Xarelto , dose reviewed and appropriate for  Cr Cl of 108 mL/min  CAD  HLD -no anginal symptoms  -per primary Cardiology  Follow up with Dr. Cindie in 6 months  Signed, Daphne Barrack, MSN, APRN, NP-C, AGACNP-BC [facility] - Electrophysiology  08/15/2023, 4:20 PM

## 2023-08-15 ENCOUNTER — Ambulatory Visit: Payer: Medicare Other | Attending: Cardiology | Admitting: Pulmonary Disease

## 2023-08-15 ENCOUNTER — Encounter: Payer: Self-pay | Admitting: Pulmonary Disease

## 2023-08-15 VITALS — BP 130/60 | HR 52 | Ht 70.5 in | Wt 230.0 lb

## 2023-08-15 DIAGNOSIS — I2510 Atherosclerotic heart disease of native coronary artery without angina pectoris: Secondary | ICD-10-CM | POA: Diagnosis not present

## 2023-08-15 DIAGNOSIS — D6869 Other thrombophilia: Secondary | ICD-10-CM

## 2023-08-15 DIAGNOSIS — I4819 Other persistent atrial fibrillation: Secondary | ICD-10-CM | POA: Diagnosis not present

## 2023-08-15 LAB — CUP PACEART INCLINIC DEVICE CHECK
Date Time Interrogation Session: 20241231162111
Implantable Pulse Generator Implant Date: 20220607

## 2023-08-15 MED ORDER — RIVAROXABAN 20 MG PO TABS: 20.0000 mg | ORAL_TABLET | Freq: Every day | ORAL | 3 refills | Status: DC

## 2023-08-15 MED ORDER — METOPROLOL SUCCINATE ER 25 MG PO TB24: ORAL_TABLET | ORAL | 3 refills | Status: DC

## 2023-08-15 NOTE — Patient Instructions (Signed)
 Medication Instructions:  Your physician recommends that you continue on your current medications as directed. Please refer to the Current Medication list given to you today.  *If you need a refill on your cardiac medications before your next appointment, please call your pharmacy*  Lab Work: If you have labs (blood work) drawn today and your tests are completely normal, you will receive your results only by: MyChart Message (if you have MyChart) OR A paper copy in the mail If you have any lab test that is abnormal or we need to change your treatment, we will call you to review the results.  Follow-Up: At [facility], you and your health needs are our priority.  As part of our continuing mission to provide you with exceptional heart care, we have created designated Provider Care Teams.  These Care Teams include your primary Cardiologist (physician) and Advanced Practice Providers (APPs -  Physician Assistants and Nurse Practitioners) who all work together to provide you with the care you need, when you need it.  We recommend signing up for the patient portal called "MyChart".  Sign up information is provided on this After Visit Summary.  MyChart is used to connect with patients for Virtual Visits (Telemedicine).  Patients are able to view lab/test results, encounter notes, upcoming appointments, etc.  Non-urgent messages can be sent to your provider as well.   To learn more about what you can do with MyChart, go to [email or web address].    Your next appointment:   6 month(s)  Provider:   You may see Lanier Prude, MD or one of the following Advanced Practice Providers on your designated Care Team:   Francis Dowse, [place] [address]" [place], [place] Sherie Don, NP Canary Brim, NP

## 2023-09-04 ENCOUNTER — Other Ambulatory Visit: Payer: Self-pay | Admitting: Cardiovascular Disease

## 2023-09-05 NOTE — Telephone Encounter (Signed)
This is a [place] pt 

## 2023-09-05 NOTE — Telephone Encounter (Signed)
Pt of Dr. Eden Emms. Hasn't been seen since January 2023 and has had 3 attempts. Does Dr. Eden Emms want to refill? Please advise

## 2023-09-05 NOTE — Telephone Encounter (Signed)
Left message for patient to call back  

## 2023-09-11 ENCOUNTER — Encounter: Payer: Self-pay | Admitting: Internal Medicine

## 2023-09-11 ENCOUNTER — Ambulatory Visit: Payer: Medicare Other

## 2023-09-11 DIAGNOSIS — R55 Syncope and collapse: Secondary | ICD-10-CM | POA: Diagnosis not present

## 2023-09-11 LAB — CUP PACEART REMOTE DEVICE CHECK
Date Time Interrogation Session: 20250126230749
Implantable Pulse Generator Implant Date: 20220607

## 2023-09-14 NOTE — Progress Notes (Signed)
[facility] Summary Report / Loop Recorder

## 2023-10-04 ENCOUNTER — Other Ambulatory Visit: Payer: Self-pay | Admitting: Cardiovascular Disease

## 2023-10-16 ENCOUNTER — Ambulatory Visit: Payer: Medicare Other

## 2023-10-16 DIAGNOSIS — R55 Syncope and collapse: Secondary | ICD-10-CM | POA: Diagnosis not present

## 2023-10-16 LAB — CUP PACEART REMOTE DEVICE CHECK
Date Time Interrogation Session: 20250302230602
Implantable Pulse Generator Implant Date: 20220607

## 2023-10-17 ENCOUNTER — Encounter: Payer: Self-pay | Admitting: Cardiovascular Disease

## 2023-10-17 MED ORDER — ROSUVASTATIN CALCIUM 10 MG PO TABS: 10.0000 mg | ORAL_TABLET | Freq: Every day | ORAL | 3 refills | Status: AC

## 2023-10-18 ENCOUNTER — Encounter: Payer: Self-pay | Admitting: Internal Medicine

## 2023-10-20 NOTE — Progress Notes (Signed)
 [facility] Summary Report / Loop Recorder

## 2023-11-17 DIAGNOSIS — R350 Frequency of micturition: Secondary | ICD-10-CM | POA: Diagnosis not present

## 2023-11-17 DIAGNOSIS — N401 Enlarged prostate with lower urinary tract symptoms: Secondary | ICD-10-CM | POA: Diagnosis not present

## 2023-11-17 DIAGNOSIS — N5201 Erectile dysfunction due to arterial insufficiency: Secondary | ICD-10-CM | POA: Diagnosis not present

## 2023-11-17 NOTE — Progress Notes (Signed)
 [facility] Summary Report / Loop Recorder

## 2023-11-20 ENCOUNTER — Ambulatory Visit (INDEPENDENT_AMBULATORY_CARE_PROVIDER_SITE_OTHER): Payer: Medicare Other

## 2023-11-20 DIAGNOSIS — R55 Syncope and collapse: Secondary | ICD-10-CM

## 2023-11-20 LAB — CUP PACEART REMOTE DEVICE CHECK
Date Time Interrogation Session: 20250406230720
Implantable Pulse Generator Implant Date: 20220607

## 2023-11-21 ENCOUNTER — Encounter: Payer: Self-pay | Admitting: Internal Medicine

## 2023-12-01 IMAGING — CT CT CHEST W/O CM
1 of 2 series · 15 of 32 positions shown, 19 images · non-contrast
Comparison: 06/22/2021.

CLINICAL DATA: Pulmonary nodules.



[Series 6: super d · axial · 0.87mm/px · z∈[+772,+1083]mm · 15 of 434 slices shown, 19 images]
[im 23/434  mediastinal]
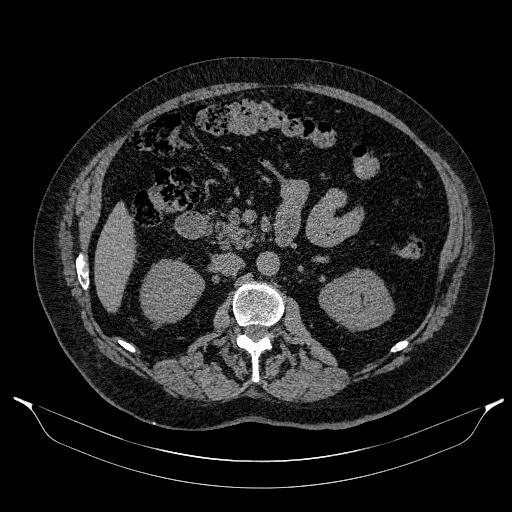
[im 23/434  lung]
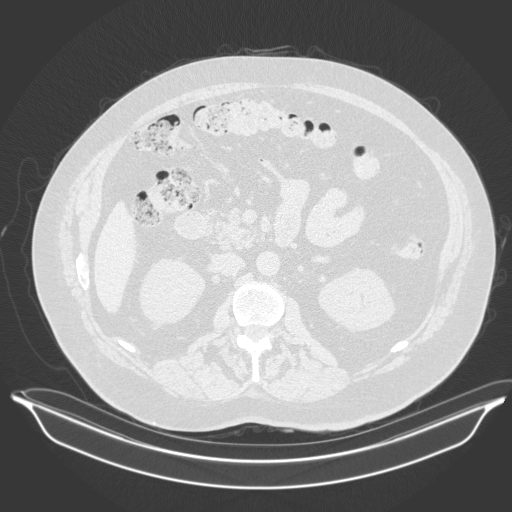
[im 69/434  lung]
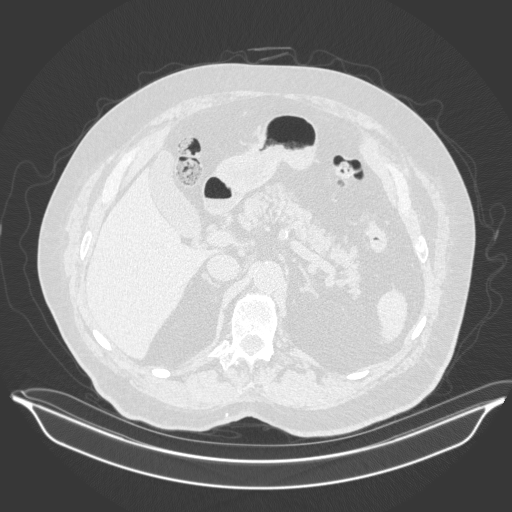
[im 92/434  lung]
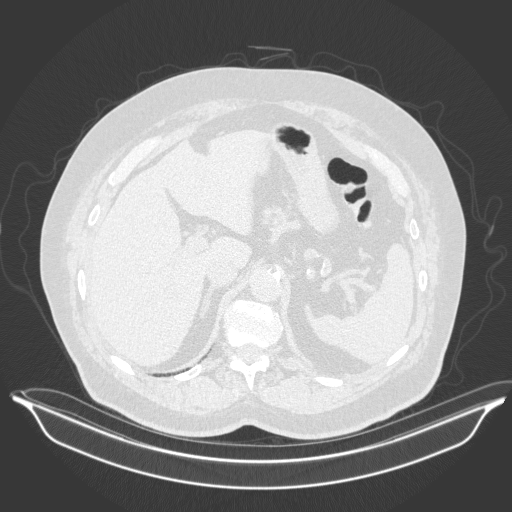
[im 114/434  lung]
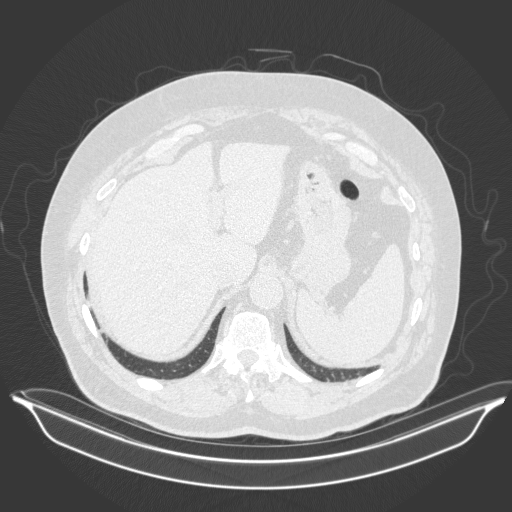
[im 145/434  mediastinal]
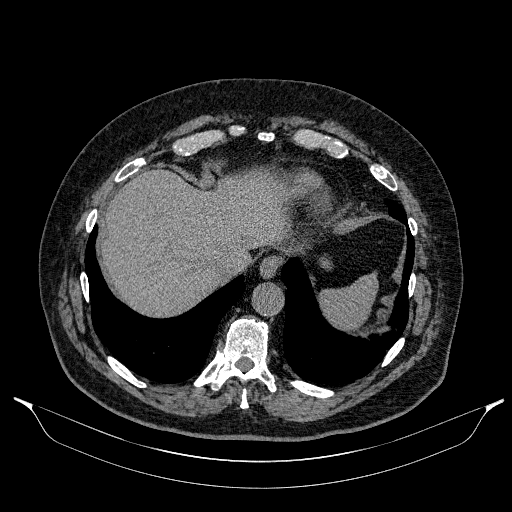
[im 145/434  lung]
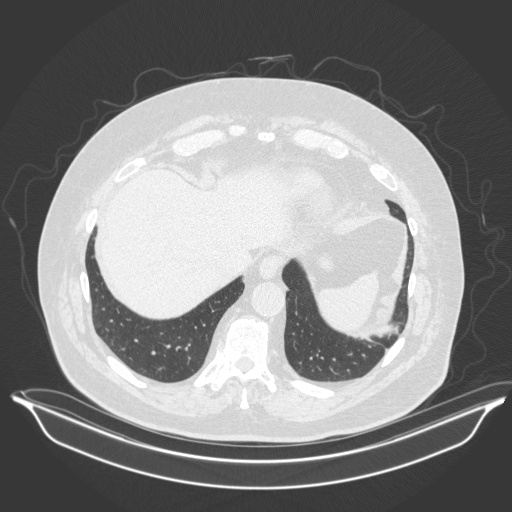
[im 160/434  lung]
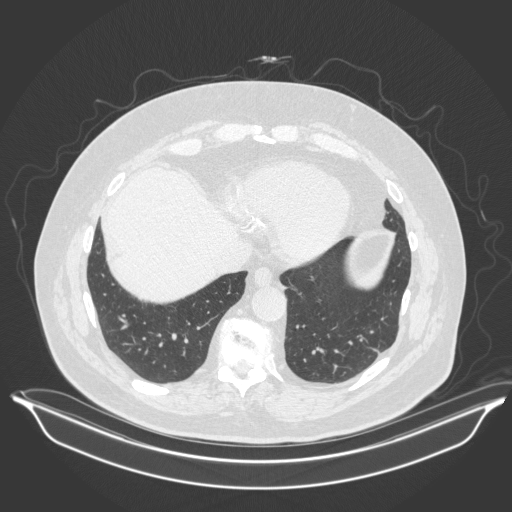
[im 205/434  lung]
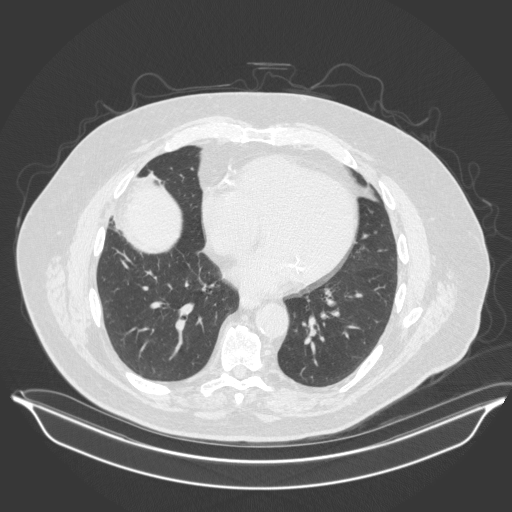
[im 206/434  lung]
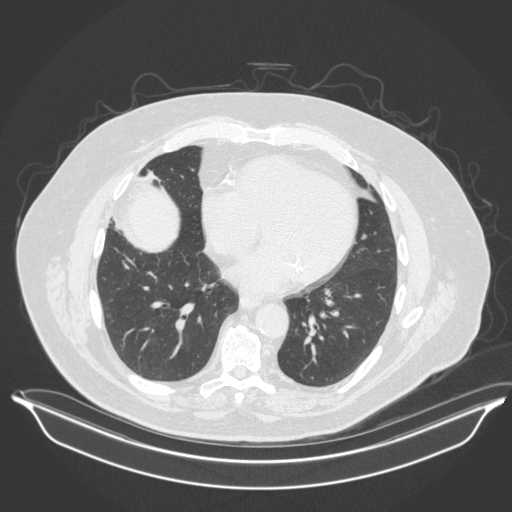
[im 228/434  mediastinal]
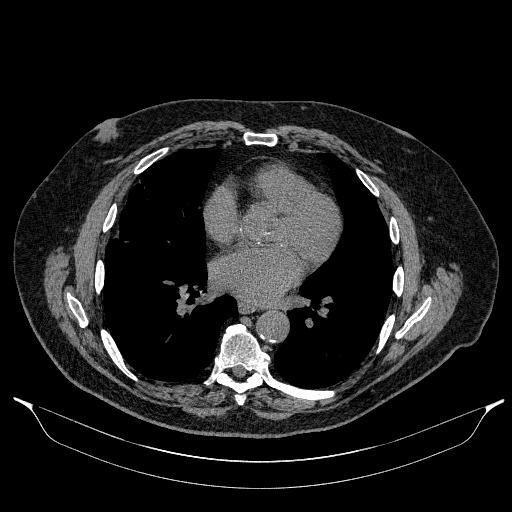
[im 228/434  lung]
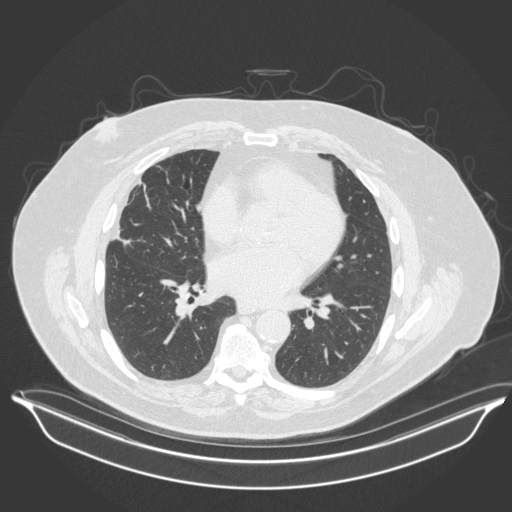
[im 274/434  lung]
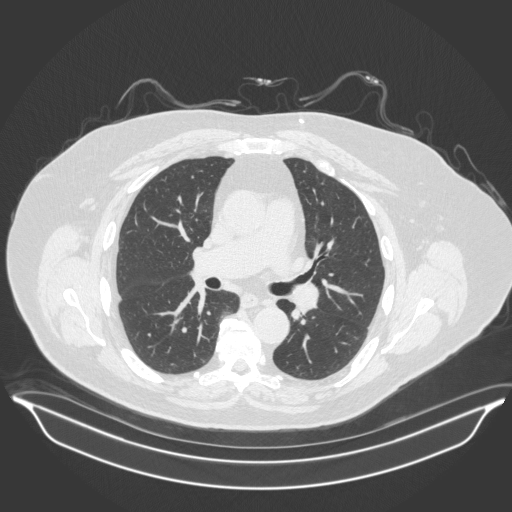
[im 289/434  lung]
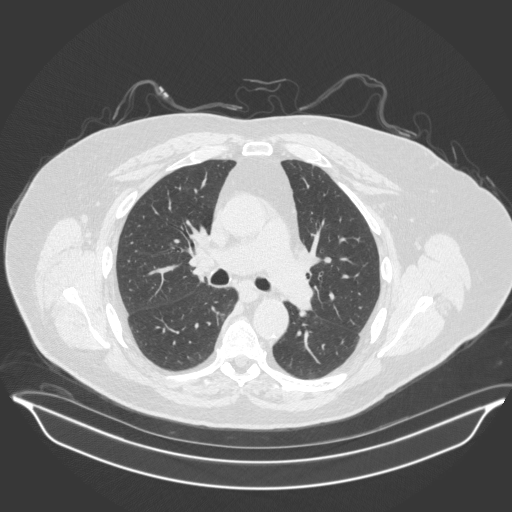
[im 320/434  lung]
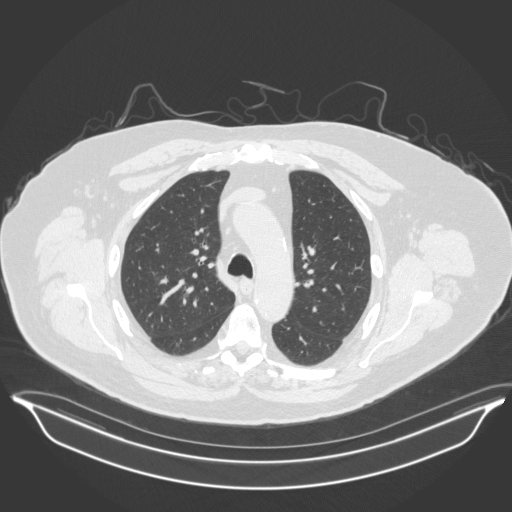
[im 342/434  mediastinal]
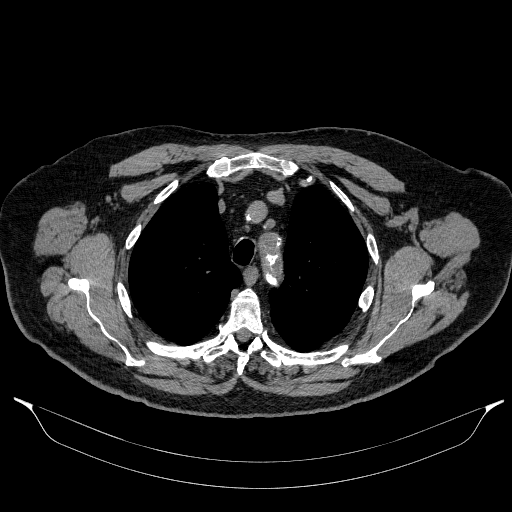
[im 342/434  lung]
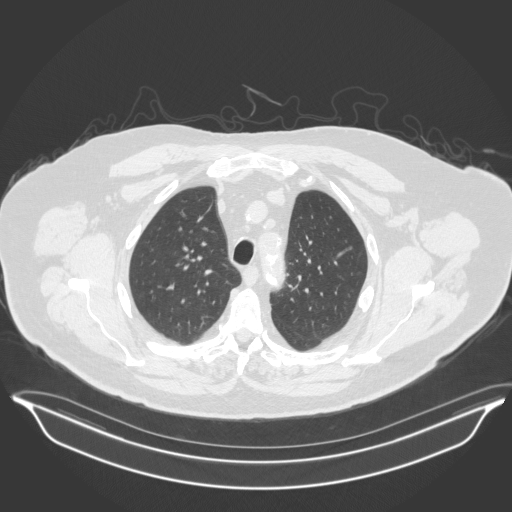
[im 365/434  lung]
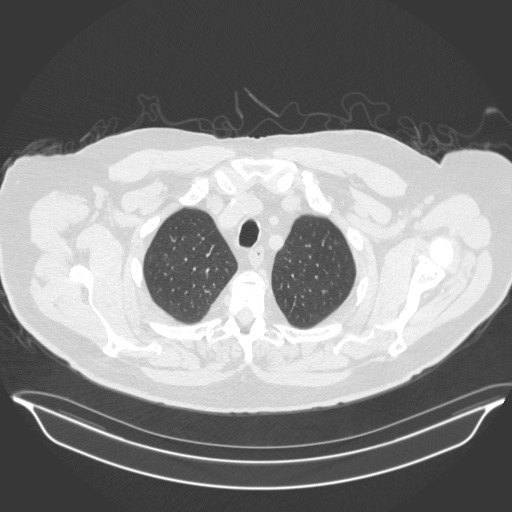
[im 411/434  lung]
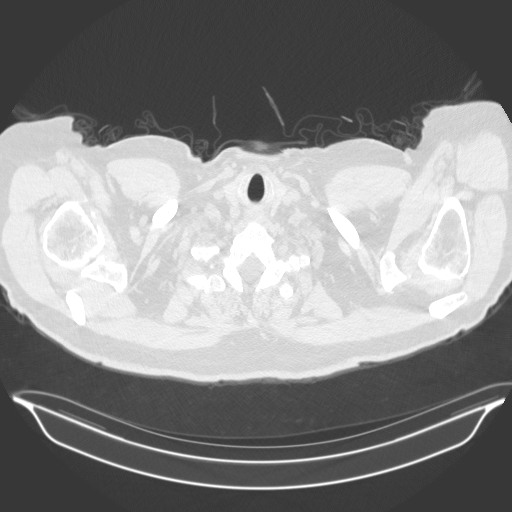

[15 of 32 positions shown; findings below may reference images not displayed]

FINDINGS: Cardiovascular: Atherosclerotic calcification of the aorta, aortic
valve and coronary arteries. Heart is at the upper limits of normal
in size to mildly enlarged. No pericardial effusion.

Mediastinum/Nodes: No pathologically enlarged mediastinal or
axillary lymph nodes. Hilar regions are difficult to evaluate
without IV contrast. Esophagus is grossly unremarkable.

Lungs/Pleura: Subpleural scarring in the right middle lobe.
Additional scattered scarring in the lung bases. Subpleural nodular
lesion in the anterolateral right lower lobe has nearly completely
resolved in the interval with residual scarring. No suspicious
pulmonary nodules. No pleural fluid. Airway is unremarkable.

Upper Abdomen: Visualized portions of the liver, gallbladder,
adrenal glands unremarkable. 1.4 cm low-attenuation lesion in the
upper pole right kidney, likely a cyst. No specific follow-up
necessary. Visualized portions of the kidneys, spleen, pancreas,
stomach and bowel are otherwise unremarkable. No upper abdominal
adenopathy.

Musculoskeletal: Degenerative changes in the spine no worrisome
lytic or sclerotic lesions.
IMPRESSION: 1. Interval resolution of a previously seen subpleural nodule in the
right lower lobe. No suspicious pulmonary nodules.
2. Aortic atherosclerosis ([id number]-LC2.2). Coronary artery
calcification.

## 2023-12-04 ENCOUNTER — Encounter: Payer: Self-pay | Admitting: Family Medicine

## 2023-12-04 ENCOUNTER — Ambulatory Visit (INDEPENDENT_AMBULATORY_CARE_PROVIDER_SITE_OTHER): Admitting: Family Medicine

## 2023-12-04 VITALS — BP 142/66 | HR 67 | Temp 98.6°F | Wt 229.9 lb

## 2023-12-04 DIAGNOSIS — R6889 Other general symptoms and signs: Secondary | ICD-10-CM | POA: Diagnosis not present

## 2023-12-04 DIAGNOSIS — R7303 Prediabetes: Secondary | ICD-10-CM | POA: Diagnosis not present

## 2023-12-04 DIAGNOSIS — I2510 Atherosclerotic heart disease of native coronary artery without angina pectoris: Secondary | ICD-10-CM | POA: Diagnosis not present

## 2023-12-04 DIAGNOSIS — E7849 Other hyperlipidemia: Secondary | ICD-10-CM | POA: Diagnosis not present

## 2023-12-04 DIAGNOSIS — Z79899 Other long term (current) drug therapy: Secondary | ICD-10-CM | POA: Diagnosis not present

## 2023-12-04 NOTE — Progress Notes (Signed)
 Established Patient Office Visit  Subjective   Patient ID: Joe Davis, male    DOB: [birth year]  Age: 79 y.o. MRN: [id number]  Chief Complaint  Patient presents with   Labs Only   Chills    HPI   Joe Davis is seen today and initially chief complaint was obtained that he was having "chills ".  However, on further questioning he denies any fever but is describing more of a cold intolerance.  Even when house temperatures mid 70s he feels chilled.  He does take Xarelto  for atrial fibrillation.  Has not had recent TSH.  Appetite and weight are stable.  Still very avid gardener and stays fairly active.  Other medical problems include history of CAD, past history of DVT, history of paroxysmal atrial fibrillation, BPH.  Hyperlipidemia treated with rosuvastatin  10 mg daily.  Denies any recent chest pains.  Past Medical History:  Diagnosis Date   Arthritis    "knees, hips" (10/10/2014)   Basal cell carcinoma    BPH (benign prostatic hyperplasia)    Bradycardia vasovagal response 10 yrs ago   asymptomatic   Complication of anesthesia    bradycardia with spinal   Coronary artery disease    Diastolic dysfunction    Per echo in 2008   DVT (deep venous thrombosis) (HCC) 08/15/1977   RLE   Erectile dysfunction    History of gout    History of nuclear stress test    ETT-Myoview  3/17: Hypertensive blood pressure response to exercise, normal perfusion, EF 60%, low risk study   Hypercholesterolemia    ? statin intolerant. May tolerate Lipitor   Normal nuclear stress test 12/13/2009   Obesity    Pulmonary embolism (HCC) 08/15/1977   Syncope and collapse 8-10 yrs ago   Past Surgical History:  Procedure Laterality Date   ATRIAL FIBRILLATION ABLATION N/A 11/29/2022   Procedure: ATRIAL FIBRILLATION ABLATION;  Surgeon: Boyce Byes, MD;  Location: [facility];  Service: Cardiovascular;  Laterality: N/A;   CARDIOVASCULAR STRESS TEST  12/13/2009   EF 73%; No ischemia    CARDIOVERSION N/A 09/05/2022   Procedure: CARDIOVERSION;  Surgeon: Jerryl Morin, DO;  Location: [facility];  Service: Cardiovascular;  Laterality: N/A;   COLONOSCOPY  08/16/2011   COLONOSCOPY WITH PROPOFOL  N/A 05/18/2022   Procedure: COLONOSCOPY WITH PROPOFOL ;  Surgeon: Evangeline Hilts, MD;  Location: [facility];  Service: Gastroenterology;  Laterality: N/A;   IVC FILTER INSERTION N/A 10/06/2020   Procedure: IVC FILTER INSERTION;  Surgeon: Margherita Shell, MD;  Location: [facility];  Service: Cardiovascular;  Laterality: N/A;  w/ IVC Venogram   IVC FILTER REMOVAL N/A 01/05/2021   Procedure: IVC FILTER REMOVAL;  Surgeon: Margherita Shell, MD;  Location: [facility];  Service: Cardiovascular;  Laterality: N/A;   JOINT REPLACEMENT     KNEE ARTHROSCOPY Right 10/03/2012   Procedure: RIGHT KNEE ARTHROSCOPY WITH DEBRIDEMENT;  Surgeon: Aurther Blue, MD;  Location: [facility];  Service: Orthopedics;  Laterality: Right;  WITH DEBRIDEMENT   LOOP RECORDER IMPLANT Left    MOHS SURGERY  6 yrs    nose   POLYPECTOMY  05/18/2022   Procedure: POLYPECTOMY;  Surgeon: Evangeline Hilts, MD;  Location: [facility];  Service: Gastroenterology;;   SKIN CANCER EXCISION     left upper arm, biopsy   TOTAL HIP ARTHROPLASTY Right 10/06/2014    reports that he quit smoking about 46 years ago. His smoking use included cigarettes. He started smoking about 56  years ago. He has a 10 pack-year smoking history. He has never used smokeless tobacco. He reports current alcohol use of about 7.0 standard drinks of alcohol per week. He reports that he does not use drugs. He was adopted. Family history is unknown by patient. Allergies  Allergen Reactions   Tape Other (See Comments)    Sensitivity: use paper tape    Review of Systems  Constitutional:  Negative for fever and malaise/fatigue.  Eyes:  Negative for blurred vision.  Respiratory:  Negative for shortness of breath.   Cardiovascular:  Negative for  chest pain.  Gastrointestinal:  Negative for abdominal pain.  Genitourinary:  Negative for dysuria.  Neurological:  Negative for dizziness, weakness and headaches.      Objective:     BP (!) 142/66 (BP Location: Right Arm, Patient Position: Sitting, Cuff Size: Large)   Pulse 67   Temp 98.6 F (37 C) (Oral)   Wt 229 lb 14.4 oz (104.3 kg)   SpO2 95%   BMI 32.52 kg/m  BP Readings from Last 3 Encounters:  12/04/23 (!) 142/66  08/15/23 130/60  05/31/23 (!) 140/70   Wt Readings from Last 3 Encounters:  12/04/23 229 lb 14.4 oz (104.3 kg)  08/15/23 230 lb (104.3 kg)  05/31/23 230 lb (104.3 kg)      Physical Exam Vitals reviewed.  Constitutional:      General: He is not in acute distress.    Appearance: He is not ill-appearing.  Cardiovascular:     Rate and Rhythm: Normal rate and regular rhythm.  Pulmonary:     Effort: Pulmonary effort is normal.     Breath sounds: Normal breath sounds. No wheezing or rales.  Musculoskeletal:     Right lower leg: No edema.     Left lower leg: No edema.  Neurological:     Mental Status: He is alert.      No results found for any visits on 12/04/23.    The ASCVD Risk score (Arnett DK, et al., 2019) failed to calculate for the following reasons:   Risk score cannot be calculated because patient has a medical history suggesting prior/existing ASCVD    Assessment & Plan:    #1 symptoms of cold intolerance.  Possibly related to his Xarelto  therapy.  Check TSH and CBC.  No other overt symptoms of hypothyroidism.  #2 history of paroxysmal atrial fibrillation.  Patient currently appears to be in sinus rhythm.  Had previous ablation procedure.  Maintained on low-dose Toprol  and Xarelto .  Check CBC and comprehensive metabolic panel  #3 hyperlipidemia treated with rosuvastatin  10 mg daily.  Tolerating well.  Check lipid and CMP    No follow-ups on file.    Glean Lamy, MD

## 2023-12-05 LAB — COMPREHENSIVE METABOLIC PANEL WITH GFR
ALT: 18 U/L (ref 0–53)
AST: 28 U/L (ref 0–37)
Albumin: 4.3 g/dL (ref 3.5–5.2)
Alkaline Phosphatase: 51 U/L (ref 39–117)
BUN: 21 mg/dL (ref 6–23)
CO2: 26 meq/L (ref 19–32)
Calcium: 9.4 mg/dL (ref 8.4–10.5)
Chloride: 107 meq/L (ref 96–112)
Creatinine, Ser: 0.97 mg/dL (ref 0.40–1.50)
GFR: 74.01 mL/min (ref >60.00)
Glucose, Bld: 85 mg/dL (ref 70–99)
Potassium: 4.3 meq/L (ref 3.5–5.1)
Sodium: 141 meq/L (ref 135–145)
Total Bilirubin: 0.6 mg/dL (ref 0.2–1.2)
Total Protein: 6.7 g/dL (ref 6.0–8.3)

## 2023-12-05 LAB — CBC WITH DIFFERENTIAL/PLATELET
Basophils Absolute: 0.1 10*3/uL (ref 0.0–0.1)
Basophils Relative: 0.8 % (ref 0.0–3.0)
Eosinophils Absolute: 0.6 10*3/uL (ref 0.0–0.7)
Eosinophils Relative: 8.2 % — ABNORMAL HIGH (ref 0.0–5.0)
HCT: 43.4 % (ref 39.0–52.0)
Hemoglobin: 14.3 g/dL (ref 13.0–17.0)
Lymphocytes Relative: 20.2 % (ref 12.0–46.0)
Lymphs Abs: 1.6 10*3/uL (ref 0.7–4.0)
MCHC: 33 g/dL (ref 30.0–36.0)
MCV: 89.9 fl (ref 78.0–100.0)
Monocytes Absolute: 0.9 10*3/uL (ref 0.1–1.0)
Monocytes Relative: 11 % (ref 3.0–12.0)
Neutro Abs: 4.8 10*3/uL (ref 1.4–7.7)
Neutrophils Relative %: 59.8 % (ref 43.0–77.0)
Platelets: 202 10*3/uL (ref 150.0–400.0)
RBC: 4.82 Mil/uL (ref 4.22–5.81)
RDW: 14.4 % (ref 11.5–15.5)
WBC: 8 10*3/uL (ref 4.0–10.5)

## 2023-12-05 LAB — LIPID PANEL
Cholesterol: 144 mg/dL (ref 0–200)
HDL: 42.2 mg/dL (ref >39.00)
LDL Cholesterol: 74 mg/dL (ref 0–99)
NonHDL: 102.18
Total CHOL/HDL Ratio: 3
Triglycerides: 143 mg/dL (ref 0.0–149.0)
VLDL: 28.6 mg/dL (ref 0.0–40.0)

## 2023-12-05 LAB — TSH: TSH: 2.04 u[IU]/mL (ref 0.35–5.50)

## 2023-12-25 ENCOUNTER — Ambulatory Visit (INDEPENDENT_AMBULATORY_CARE_PROVIDER_SITE_OTHER): Payer: Medicare Other

## 2023-12-25 DIAGNOSIS — R55 Syncope and collapse: Secondary | ICD-10-CM

## 2023-12-25 LAB — CUP PACEART REMOTE DEVICE CHECK
Date Time Interrogation Session: 20250511233534
Implantable Pulse Generator Implant Date: 20220607

## 2023-12-26 ENCOUNTER — Ambulatory Visit: Payer: Self-pay | Admitting: Internal Medicine

## 2024-01-01 ENCOUNTER — Other Ambulatory Visit: Payer: Self-pay | Admitting: Family Medicine

## 2024-01-04 ENCOUNTER — Other Ambulatory Visit: Payer: Self-pay

## 2024-01-04 MED ORDER — METOPROLOL SUCCINATE ER 25 MG PO TB24: ORAL_TABLET | ORAL | 3 refills | Status: AC

## 2024-01-04 NOTE — Progress Notes (Signed)
 [facility] Summary Report / Loop Recorder

## 2024-01-05 ENCOUNTER — Telehealth: Payer: Self-pay

## 2024-01-05 ENCOUNTER — Other Ambulatory Visit (HOSPITAL_COMMUNITY): Payer: Self-pay

## 2024-01-05 NOTE — Telephone Encounter (Signed)
 Pharmacy Patient Advocate Encounter   Received notification from CoverMyMeds that prior authorization for Colchicine  0.6 capsules is required/requested.   Insurance verification completed.   The patient is insured through [employer] [place] .   Per test claim: PA required; PA submitted to above mentioned insurance via CoverMyMeds Key/confirmation #/EOC [id number] Status is pending

## 2024-01-10 NOTE — Telephone Encounter (Signed)
 Pharmacy Patient Advocate Encounter  Received notification from [employer] that Prior Authorization for Mitigare  0.6MG  capsules has been DENIED.  Full denial letter will be uploaded to the media tab. See denial reason below.   PA #/Case ID/Reference #: [id number]  *Insurance will cover colchicine  tablets with no prior auth . If clinically appropriate please send in the tablets to patients preferred pharmacy.

## 2024-01-10 NOTE — Telephone Encounter (Signed)
 I spoke with the pharmacy and patient has paid out of pocket for medication.

## 2024-01-25 ENCOUNTER — Ambulatory Visit

## 2024-01-25 DIAGNOSIS — R55 Syncope and collapse: Secondary | ICD-10-CM | POA: Diagnosis not present

## 2024-01-26 ENCOUNTER — Ambulatory Visit: Payer: Self-pay | Admitting: Internal Medicine

## 2024-01-29 ENCOUNTER — Ambulatory Visit: Payer: Medicare Other

## 2024-02-07 ENCOUNTER — Encounter: Payer: Self-pay | Admitting: Cardiovascular Disease

## 2024-02-12 NOTE — Progress Notes (Signed)
 [facility] Summary Report / Loop Recorder

## 2024-02-19 ENCOUNTER — Other Ambulatory Visit: Payer: Self-pay | Admitting: Family Medicine

## 2024-02-19 MED ORDER — RIVAROXABAN 20 MG PO TABS: 20.0000 mg | ORAL_TABLET | Freq: Every day | ORAL | 1 refills | Status: AC

## 2024-02-19 NOTE — Telephone Encounter (Signed)
 Copied from CRM [id number]. Topic: Clinical - Medication Refill >> Feb 19, 2024  1:01 PM Franky GRADE wrote: Medication: rivaroxaban  (XARELTO ) 20 MG TABS tablet [[id number]]  Has the patient contacted their pharmacy? Yes, center well advised that they will not be able to deliver until next week to ask the provider for a short fill to the local pharmacy.  (Agent: If no, request that the patient contact the pharmacy for the refill. If patient does not wish to contact the pharmacy document the reason why and proceed with request.) (Agent: If yes, when and what did the pharmacy advise?)  This is the patient's preferred pharmacy:  [id number] - [place], [place] - [phone] [place] [address] Phone: [phone] Fax: [phone]  Is this the correct pharmacy for this prescription? Yes If no, delete pharmacy and type the correct one.   Has the prescription been filled recently? No  Is the patient out of the medication? Yes  Has the patient been seen for an appointment in the last year OR does the patient have an upcoming appointment? Yes  Can we respond through MyChart? Yes  Agent: Please be advised that Rx refills may take up to 3 business days. We ask that you follow-up with your pharmacy.

## 2024-02-26 ENCOUNTER — Ambulatory Visit (INDEPENDENT_AMBULATORY_CARE_PROVIDER_SITE_OTHER)

## 2024-02-26 ENCOUNTER — Ambulatory Visit: Payer: Self-pay | Admitting: Internal Medicine

## 2024-02-26 DIAGNOSIS — R55 Syncope and collapse: Secondary | ICD-10-CM | POA: Diagnosis not present

## 2024-02-26 LAB — CUP PACEART REMOTE DEVICE CHECK
Date Time Interrogation Session: 20250713231841
Implantable Pulse Generator Implant Date: 20220607

## 2024-03-04 ENCOUNTER — Telehealth: Payer: Self-pay | Admitting: Family Medicine

## 2024-03-04 ENCOUNTER — Ambulatory Visit: Payer: Medicare Other

## 2024-03-04 NOTE — Telephone Encounter (Signed)
 Copied from CRM [id number]. Topic: General - Call Back - No Documentation >> Mar 04, 2024 10:35 AM Rea BROCKS wrote: Reason for CRM: Patient had a call from a wellness center from Medicare Part D. Patient thinks it was a scam call.  Patient was asked a same question six or seven times in regards to his biological family and if his father was in pain and that she was going to forward over information to Dr. Micheal in regards to patient being assessed for a psychological evaluation. Patient just wanted to make office aware.   Patient's contact is [phone].

## 2024-03-05 ENCOUNTER — Telehealth: Payer: Self-pay

## 2024-03-05 NOTE — Telephone Encounter (Signed)
 Patient's wife informed of message from previous encounter

## 2024-03-05 NOTE — Telephone Encounter (Signed)
 Left detailed message on patient vm informing him of message below and to call back with any questions.

## 2024-03-05 NOTE — Telephone Encounter (Signed)
 Copied from CRM [id number]. Topic: General - Call Back - No Documentation >> Mar 05, 2024 11:11 AM Chiquita SQUIBB wrote: Reason for CRM: Patients wife is calling [place] or  [place] back. Please contact her back.

## 2024-03-07 ENCOUNTER — Ambulatory Visit: Admitting: Family Medicine

## 2024-03-07 ENCOUNTER — Encounter: Payer: Self-pay | Admitting: Family Medicine

## 2024-03-07 DIAGNOSIS — Z0000 Encounter for general adult medical examination without abnormal findings: Secondary | ICD-10-CM | POA: Diagnosis not present

## 2024-03-07 NOTE — Progress Notes (Signed)
 Patient unable to obtain vital signs due to telehealth visit

## 2024-03-07 NOTE — Progress Notes (Signed)
 PATIENT CHECK-IN and HEALTH RISK ASSESSMENT QUESTIONNAIRE:  -completed by phone/video for upcoming Medicare Preventive Visit  Pre-Visit Check-in: 1)Vitals (height, wt, BP, etc) - record in vitals section for visit on day of visit Request home vitals (wt, BP, etc.) and enter into vitals, THEN update Vital Signs SmartPhrase below at the top of the HPI. See below.  2)Review and Update Medications, Allergies PMH, Surgeries, Social history in Epic 3)Hospitalizations in the last year with date/reason? no  4)Review and Update Care Team (patient's specialists) in Epic 5) Complete PHQ9 in Epic  6) Complete Fall Screening in Epic 7)Review all Health Maintenance Due and order if not done.  Medicare Wellness Patient Questionnaire:  Answer theses question about your habits: How often do you have a drink containing alcohol? 1 daily  How many drinks containing alcohol do you have on a typical day when you are drinking?1 How often do you have six or more drinks on one occasion?n Have you ever smoked?yes  Quit date if applicable? 40 years ago   How many packs a day do/did you smoke?  1/2-1 Do you use smokeless tobacco? no Do you use an illicit drugs? no On average, how many days per week do you engage in moderate to strenuous exercise (like a brisk walk)?7  On average, how many minutes do you engage in exercise at this level? 3 hours a day Are you sexually active? Yes Number of partners?1 Typical breakfast: Cereal with fruit  Typical lunch : Varies  Typical dinner: Varies  Typical snacks:   Beverages:  Gatorade  Answer theses question about your everyday activities: Can you perform most household chores? Yes  Are you deaf or have significant trouble hearing? no Do you feel that you have a problem with memory? no Do you feel safe at home?yes  Last dentist visit? 1 week ago  8. Do you have any difficulty performing your everyday activities?no Are you having any difficulty walking, taking  medications on your own, and or difficulty managing daily home needs?no Do you have difficulty walking or climbing stairs? Yes  Do you have difficulty dressing or bathing?no Do you have difficulty doing errands alone such as visiting a doctor's office or shopping?no Do you currently have any difficulty preparing food and eating?no Do you currently have any difficulty using the toilet?no Do you have any difficulty managing your finances?no Do you have any difficulties with housekeeping of managing your housekeeping?no   Do you have Advanced Directives in place (Living Will, Healthcare Power or Attorney)?  Yes    Last eye Exam and location? 1 year ago Dr. Evertt    Do you currently use prescribed or non-prescribed narcotic or opioid pain medications? no  Do you have a history or close family history of breast, ovarian, tubal or peritoneal cancer or a family member with BRCA (breast cancer susceptibility 1 and 2) gene mutations? no    ----------------------------------------------------------------------------------------------------------------------------------------------------------------------------------------------------------------------  Because this visit was a virtual/telehealth visit, some criteria may be missing or patient reported. Any vitals not documented were not able to be obtained and vitals that have been documented are patient reported.    MEDICARE ANNUAL PREVENTIVE CARE VISIT WITH PROVIDER (Welcome to Medicare, initial annual wellness or annual wellness exam)  Virtual Visit via Phone Note  I connected with Joe Davis on 03/07/24  by phone and verified that I am speaking with the correct person using two identifiers. He prefers a phone visit.   Location patient: home Location provider:work or home office Persons  participating in the virtual visit: patient, provider  Concerns and/or follow up today: reports ding well, just got a new puppy.    See HM  section in Epic for other details of completed HM.    ROS: negative for report of fevers, unintentional weight loss, vision changes, vision loss, hearing loss or change, chest pain, sob, hemoptysis, melena, hematochezia, hematuria, falls, bleeding or bruising, thoughts of suicide or self harm, memory loss  Patient-completed extensive health risk assessment - reviewed and discussed with the patient: See Health Risk Assessment completed with patient prior to the visit either above or in recent phone note. This was reviewed in detailed with the patient today and appropriate recommendations, orders and referrals were placed as needed per Summary below and patient instructions.   Review of Medical History: -PMH, PSH, Family History and current specialty and care providers reviewed and updated and listed below   Patient Care Team: Micheal Wolm ORN, MD as PCP - General (Family Medicine) Delford Maude BROCKS, MD as PCP - Cardiology (Cardiology) Cindie Ole DASEN, MD as PCP - Electrophysiology (Cardiology)   Past Medical History:  Diagnosis Date   Arthritis    knees, hips (10/10/2014)   Basal cell carcinoma    BPH (benign prostatic hyperplasia)    Bradycardia vasovagal response 10 yrs ago   asymptomatic   Complication of anesthesia    bradycardia with spinal   Coronary artery disease    Diastolic dysfunction    Per echo in 2008   DVT (deep venous thrombosis) (HCC) 08/15/1977   RLE   Erectile dysfunction    History of gout    History of nuclear stress test    ETT-Myoview  3/17: Hypertensive blood pressure response to exercise, normal perfusion, EF 60%, low risk study   Hypercholesterolemia    ? statin intolerant. May tolerate Lipitor   Normal nuclear stress test 12/13/2009   Obesity    Pulmonary embolism (HCC) 08/15/1977   Syncope and collapse 8-10 yrs ago    Past Surgical History:  Procedure Laterality Date   ATRIAL FIBRILLATION ABLATION N/A 11/29/2022   Procedure: ATRIAL  FIBRILLATION ABLATION;  Surgeon: Cindie Ole DASEN, MD;  Location: [facility];  Service: Cardiovascular;  Laterality: N/A;   CARDIOVASCULAR STRESS TEST  12/13/2009   EF 73%; No ischemia   CARDIOVERSION N/A 09/05/2022   Procedure: CARDIOVERSION;  Surgeon: Sheena Pugh, DO;  Location: [facility];  Service: Cardiovascular;  Laterality: N/A;   COLONOSCOPY  08/16/2011   COLONOSCOPY WITH PROPOFOL  N/A 05/18/2022   Procedure: COLONOSCOPY WITH PROPOFOL ;  Surgeon: Burnette Fallow, MD;  Location: [facility];  Service: Gastroenterology;  Laterality: N/A;   IVC FILTER INSERTION N/A 10/06/2020   Procedure: IVC FILTER INSERTION;  Surgeon: Serene Gaile ORN, MD;  Location: [facility];  Service: Cardiovascular;  Laterality: N/A;  w/ IVC Venogram   IVC FILTER REMOVAL N/A 01/05/2021   Procedure: IVC FILTER REMOVAL;  Surgeon: Serene Gaile ORN, MD;  Location: [facility];  Service: Cardiovascular;  Laterality: N/A;   JOINT REPLACEMENT     KNEE ARTHROSCOPY Right 10/03/2012   Procedure: RIGHT KNEE ARTHROSCOPY WITH DEBRIDEMENT;  Surgeon: Dempsey LULLA Moan, MD;  Location: [facility];  Service: Orthopedics;  Laterality: Right;  WITH DEBRIDEMENT   LOOP RECORDER IMPLANT Left    MOHS SURGERY  6 yrs    nose   POLYPECTOMY  05/18/2022   Procedure: POLYPECTOMY;  Surgeon: Burnette Fallow, MD;  Location: [facility];  Service: Gastroenterology;;   SKIN CANCER EXCISION  left upper arm, biopsy   TOTAL HIP ARTHROPLASTY Right 10/06/2014    Social History   Socioeconomic History   Marital status: Married    Spouse name: Not on file   Number of children: Not on file   Years of education: Not on file   Highest education level: Not on file  Occupational History   Not on file  Tobacco Use   Smoking status: Former    Current packs/day: 0.00    Average packs/day: 1 pack/day for 10.0 years (10.0 ttl pk-yrs)    Types: Cigarettes    Start date: 08/16/1967    Quit date: 08/15/1977    Years since  quitting: 46.5   Smokeless tobacco: Never  Vaping Use   Vaping status: Never Used  Substance and Sexual Activity   Alcohol use: Yes    Alcohol/week: 7.0 standard drinks of alcohol    Types: 7 Shots of liquor per week   Drug use: No   Sexual activity: Yes  Other Topics Concern   Not on file  Social History Narrative   Not on file   Social Drivers of Health   Financial Resource Strain: Low Risk  (03/07/2024)   Overall Financial Resource Strain (CARDIA)    Difficulty of Paying Living Expenses: Not hard at all  Food Insecurity: No Food Insecurity (03/07/2024)   Hunger Vital Sign    Worried About Running Out of Food in the Last Year: Never true    Ran Out of Food in the Last Year: Never true  Transportation Needs: No Transportation Needs (03/07/2024)   PRAPARE - Administrator, Civil Service (Medical): No    Lack of Transportation (Non-Medical): No  Physical Activity: Sufficiently Active (03/07/2024)   Exercise Vital Sign    Days of Exercise per Week: 7 days    Minutes of Exercise per Session: 120 min  Stress: Stress Concern Present (03/07/2024)   [employer] of Occupational Health - Occupational Stress Questionnaire    Feeling of Stress: To some extent  Social Connections: Socially Integrated (03/07/2024)   Social Connection and Isolation Panel    Frequency of Communication with Friends and Family: More than three times a week    Frequency of Social Gatherings with Friends and Family: Once a week    Attends Religious Services: 1 to 4 times per year    Active Member of [employer] or Organizations: Yes    Attends Banker Meetings: 1 to 4 times per year    Marital Status: Married  Catering manager Violence: Not At Risk (03/07/2024)   Humiliation, Afraid, Rape, and Kick questionnaire    Fear of Current or Ex-Partner: No    Emotionally Abused: No    Physically Abused: No    Sexually Abused: No    Family History  Adopted: Yes  Family history unknown: Yes     Current Outpatient Medications on File Prior to Visit  Medication Sig Dispense Refill   diclofenac Sodium (VOLTAREN) 1 % GEL Apply 2 g topically once a week.     metoprolol  succinate (TOPROL -XL) 25 MG 24 hr tablet Take one tablet by mouth in the morning and 1/2 tablet (12.5mg ) in the evening 135 tablet 3   MITIGARE  0.6 MG CAPS TAKE 1 CAPSULE BY MOUTH DAILY 30 capsule 2   rivaroxaban  (XARELTO ) 20 MG TABS tablet Take 1 tablet (20 mg total) by mouth daily with supper. 90 tablet 1   rosuvastatin  (CRESTOR ) 10 MG tablet Take 1 tablet (10 mg total)  by mouth daily. 90 tablet 3   tadalafil  (CIALIS ) 5 MG tablet Take 5 mg by mouth daily as needed for erectile dysfunction.  10   No current facility-administered medications on file prior to visit.    Allergies  Allergen Reactions   Tape Other (See Comments)    Sensitivity: use paper tape       Physical Exam Vitals requested from patient and listed below if patient had equipment and was able to obtain at home for this virtual visit: There were no vitals filed for this visit. Estimated body mass index is 32.52 kg/m as calculated from the following:   Height as of 08/15/23: 5' 10.5 (1.791 m).   Weight as of 12/04/23: 229 lb 14.4 oz (104.3 kg).  EKG (optional): deferred due to virtual visit  GENERAL: alert, oriented, no acute distress detected; full vision exam deferred due to pandemic and/or virtual encounter  PSYCH/NEURO: pleasant and cooperative, no obvious depression or anxiety, speech and thought processing grossly intact, Cognitive function grossly intact  Flowsheet Row Office Visit from 11/30/2022 in [facility]  PHQ-9 Total Score 6        03/07/2024    3:51 PM 01/31/2023    5:09 PM 11/30/2022    4:14 PM 11/24/2021    2:53 PM 11/16/2021   10:48 AM  Depression screen PHQ 2/9  Decreased Interest 0 0 1 0 0  Down, Depressed, Hopeless 0 0 1 0 0  PHQ - 2 Score 0 0 2 0 0  Altered sleeping   1 0 0   Tired, decreased energy   1 0 0  Change in appetite   1 0 0  Feeling bad or failure about yourself    0 0 0  Trouble concentrating   0 0 0  Moving slowly or fidgety/restless   1 0 0  Suicidal thoughts   0 0 0  PHQ-9 Score   6 0 0       11/24/2021    2:57 PM 05/18/2022    8:17 AM 11/30/2022    4:14 PM 01/31/2023    5:09 PM 03/07/2024    3:51 PM  Fall Risk  Falls in the past year? 0  0 0 1  Was there an injury with Fall? 0  0 0 0  Fall Risk Category Calculator 0  0 0 1  Fall Risk Category (Retired) Low       (RETIRED) Patient Fall Risk Level Low fall risk  Low fall risk      Patient at Risk for Falls Due to No Fall Risks  No Fall Risks No Fall Risks No Fall Risks  Fall risk Follow up   Falls evaluation completed  Falls evaluation completed     Data saved with a previous flowsheet row definition   One fall while walking through the woods and stepped in a hole. No injury. Feels like balance is pretty good.   SUMMARY AND PLAN:  Encounter for Medicare annual wellness exam  Discussed applicable health maintenance/preventive health measures and advised and referred or ordered per patient preferences: -discussed vaccine due recs and risks, advised can get at the pharmacy and to let us  know if gets so that we can update chart, advised on fall vaccines Health Maintenance  Topic Date Due   Zoster Vaccines- Shingrix (1 of 2) Never done   COVID-19 Vaccine (4 - 2024-25 season) 04/16/2023   INFLUENZA VACCINE  03/15/2024   DTaP/Tdap/Td (2 - Tdap) 01/21/2025  Medicare Annual Wellness (AWV)  03/07/2025   Pneumococcal Vaccine: 50+ Years  Completed   Hepatitis B Vaccines  Aged Out   HPV VACCINES  Aged Out   Meningococcal B Vaccine  Aged Out   Colonoscopy  Discontinued   Hepatitis C Screening  Discontinued     Education and counseling on the following was provided based on the above review of health and a plan/checklist for the patient, along with additional information discussed, was  provided for the patient in the patient instructions :   -Provided counseling and plan for increased risk of falling if applicable per above screening. Discussed safe balance exercises that can be done at home to improve balance and discussed exercise guidelines for adults with include balance exercises at least 3 days per week. He says he will start tomorrow -Advised and counseled on a healthy lifestyle - including the importance of a healthy diet, regular physical activity, social connections and stress management. -Reviewed patient's current diet. Advised and counseled on a whole foods based healthy diet. A summary of a healthy diet was provided in the Patient Instructions.  -reviewed patient's current physical activity level and discussed exercise guidelines for adults. Discussed community resources and ideas for safe exercise at home to assist in meeting exercise guideline recommendations in a safe and healthy way.  -Advise yearly dental visits at minimum and regular eye exams   Follow up: see patient instructions   Patient Instructions  I really enjoyed getting to talk with you today! I am available on Tuesdays and Thursdays for virtual visits if you have any questions or concerns, or if I can be of any further assistance.   CHECKLIST FROM ANNUAL WELLNESS VISIT:  -Follow up (please call to schedule if not scheduled after visit):   -yearly for annual wellness visit with primary care office  Here is a list of your preventive care/health maintenance measures and the plan for each if any are due:  PLAN For any measures below that may be due:    1. Can get the shingles vaccines and the flu and covid vaccines in the fall at the pharmacy. Please let us  know when you do so that we can update your record.   Health Maintenance  Topic Date Due   Zoster Vaccines- Shingrix (1 of 2) Never done   COVID-19 Vaccine (4 - 2024-25 season) 04/16/2023   Medicare Annual Wellness (AWV)  01/31/2024    INFLUENZA VACCINE  03/15/2024   DTaP/Tdap/Td (2 - Tdap) 01/21/2025   Pneumococcal Vaccine: 50+ Years  Completed   Hepatitis B Vaccines  Aged Out   HPV VACCINES  Aged Out   Meningococcal B Vaccine  Aged Out   Colonoscopy  Discontinued   Hepatitis C Screening  Discontinued    -See a dentist at least yearly  -Get your eyes checked and then per your eye specialist's recommendations  -Other issues addressed today:   -I have included below further information regarding a healthy whole foods based diet, physical activity guidelines for adults, stress management and opportunities for social connections. I hope you find this information useful.   -----------------------------------------------------------------------------------------------------------------------------------------------------------------------------------------------------------------------------------------------------------    NUTRITION: -eat real food: lots of colorful vegetables (half the plate) and fruits -5-7 servings of vegetables and fruits per day (fresh or steamed is best), exp. 2 servings of vegetables with lunch and dinner and 2 servings of fruit per day. Berries and greens such as kale and collards are great choices.  -consume on a regular basis:  fresh fruits, fresh  veggies, fish, nuts, seeds, healthy oils (such as olive oil, avocado oil), whole grains (make sure for bread/pasta/crackers/etc., that the first ingredient on label contains the word whole), legumes. -can eat small amounts of dairy and lean meat (no larger than the palm of your hand), but avoid processed meats such as ham, bacon, lunch meat, etc. -drink water -try to avoid fast food and pre-packaged foods, processed meat, ultra processed foods/beverages (donuts, candy, etc.) -most experts advise limiting sodium to < 2300mg  per day, should limit further is any chronic conditions such as high blood pressure, heart disease, diabetes, etc. The American  Heart Association advised that < 1500mg  is is ideal -try to avoid foods/beverages that contain any ingredients with names you do not recognize  -try to avoid foods/beverages  with added sugar or sweeteners/sweets  -try to avoid sweet drinks (including diet drinks): soda, juice, Gatorade, sweet tea, power drinks, diet drinks -try to avoid white rice, white bread, pasta (unless whole grain)  EXERCISE GUIDELINES FOR ADULTS: -if you wish to increase your physical activity, do so gradually and with the approval of your doctor -STOP and seek medical care immediately if you have any chest pain, chest discomfort or trouble breathing when starting or increasing exercise  -move and stretch your body, legs, feet and arms when sitting for long periods -Physical activity guidelines for optimal health in adults: -get at least 150 minutes per week of moderate exercise (can talk, but not sing); this is about 20-30 minutes of sustained activity 5-7 days per week or two 10-15 minute episodes of sustained activity 5-7 days per week -do some muscle building/resistance training/strength training at least 2 days per week  -balance exercises 3+ days per week:   Stand somewhere where you have something sturdy to hold onto if you lose balance    1) lift up on toes, then back down, start with 5x per day and work up to 20x   2) stand and lift one leg straight out to the side so that foot is a few inches of the floor, start with 5x each side and work up to 20x each side   3) stand on one foot, start with 5 seconds each side and work up to 20 seconds on each side  If you need ideas or help with getting more active:  -Silver sneakers https://tools.silversneakers.com  -Walk with a Doc: http://www.duncan-williams.com/  -try to include resistance (weight lifting/strength building) and balance exercises twice per week: or the following link for  ideas: http://castillo-powell.com/  BuyDucts.dk  STRESS MANAGEMENT: -can try meditating, or just sitting quietly with deep breathing while intentionally relaxing all parts of your body for 5 minutes daily -if you need further help with stress, anxiety or depression please follow up with your primary doctor or contact the wonderful folks at [facility]: [phone]  SOCIAL CONNECTIONS: -options in [place] if you wish to engage in more social and exercise related activities:  -Silver sneakers https://tools.silversneakers.com  -Walk with a Doc: http://www.duncan-williams.com/  -Check out the [facility] Active Adults 50+ section on the [place] of [employer] (hiking clubs, book clubs, cards and games, chess, exercise classes, aquatic classes and much more) - see the website for details: https://www.[place].gov/departments/parks-recreation/active-adults50  -YouTube has lots of exercise videos for different ages and abilities as well  -[facility] (a variety of indoor and outdoor inperson activities for adults). [phone]. [address]  -Virtual Online Classes (a variety of topics): see seniorplanet.org or call ([phone]) [phone]  -consider volunteering at a  school, hospice center, church, senior center or elsewhere            Chiquita JONELLE Cramp, DO

## 2024-03-07 NOTE — Patient Instructions (Signed)
 I really enjoyed getting to talk with you today! I am available on Tuesdays and Thursdays for virtual visits if you have any questions or concerns, or if I can be of any further assistance.   CHECKLIST FROM ANNUAL WELLNESS VISIT:  -Follow up (please call to schedule if not scheduled after visit):   -yearly for annual wellness visit with primary care office  Here is a list of your preventive care/health maintenance measures and the plan for each if any are due:  PLAN For any measures below that may be due:    1. Can get the shingles vaccines and the flu and covid vaccines in the fall at the pharmacy. Please let us  know when you do so that we can update your record.   Health Maintenance  Topic Date Due   Zoster Vaccines- Shingrix (1 of 2) Never done   COVID-19 Vaccine (4 - 2024-25 season) 04/16/2023   Medicare Annual Wellness (AWV)  01/31/2024   INFLUENZA VACCINE  03/15/2024   DTaP/Tdap/Td (2 - Tdap) 01/21/2025   Pneumococcal Vaccine: 50+ Years  Completed   Hepatitis B Vaccines  Aged Out   HPV VACCINES  Aged Out   Meningococcal B Vaccine  Aged Out   Colonoscopy  Discontinued   Hepatitis C Screening  Discontinued    -See a dentist at least yearly  -Get your eyes checked and then per your eye specialist's recommendations  -Other issues addressed today:   -I have included below further information regarding a healthy whole foods based diet, physical activity guidelines for adults, stress management and opportunities for social connections. I hope you find this information useful.   -----------------------------------------------------------------------------------------------------------------------------------------------------------------------------------------------------------------------------------------------------------    NUTRITION: -eat real food: lots of colorful vegetables (half the plate) and fruits -5-7 servings of vegetables and fruits per day (fresh or steamed  is best), exp. 2 servings of vegetables with lunch and dinner and 2 servings of fruit per day. Berries and greens such as kale and collards are great choices.  -consume on a regular basis:  fresh fruits, fresh veggies, fish, nuts, seeds, healthy oils (such as olive oil, avocado oil), whole grains (make sure for bread/pasta/crackers/etc., that the first ingredient on label contains the word whole), legumes. -can eat small amounts of dairy and lean meat (no larger than the palm of your hand), but avoid processed meats such as ham, bacon, lunch meat, etc. -drink water -try to avoid fast food and pre-packaged foods, processed meat, ultra processed foods/beverages (donuts, candy, etc.) -most experts advise limiting sodium to < 2300mg  per day, should limit further is any chronic conditions such as high blood pressure, heart disease, diabetes, etc. The American Heart Association advised that < 1500mg  is is ideal -try to avoid foods/beverages that contain any ingredients with names you do not recognize  -try to avoid foods/beverages  with added sugar or sweeteners/sweets  -try to avoid sweet drinks (including diet drinks): soda, juice, Gatorade, sweet tea, power drinks, diet drinks -try to avoid white rice, white bread, pasta (unless whole grain)  EXERCISE GUIDELINES FOR ADULTS: -if you wish to increase your physical activity, do so gradually and with the approval of your doctor -STOP and seek medical care immediately if you have any chest pain, chest discomfort or trouble breathing when starting or increasing exercise  -move and stretch your body, legs, feet and arms when sitting for long periods -Physical activity guidelines for optimal health in adults: -get at least 150 minutes per week of moderate exercise (can talk, but not sing);  this is about 20-30 minutes of sustained activity 5-7 days per week or two 10-15 minute episodes of sustained activity 5-7 days per week -do some muscle  building/resistance training/strength training at least 2 days per week  -balance exercises 3+ days per week:   Stand somewhere where you have something sturdy to hold onto if you lose balance    1) lift up on toes, then back down, start with 5x per day and work up to 20x   2) stand and lift one leg straight out to the side so that foot is a few inches of the floor, start with 5x each side and work up to 20x each side   3) stand on one foot, start with 5 seconds each side and work up to 20 seconds on each side  If you need ideas or help with getting more active:  -Silver sneakers https://tools.silversneakers.com  -Walk with a Doc: http://www.duncan-williams.com/  -try to include resistance (weight lifting/strength building) and balance exercises twice per week: or the following link for ideas: http://castillo-powell.com/  BuyDucts.dk  STRESS MANAGEMENT: -can try meditating, or just sitting quietly with deep breathing while intentionally relaxing all parts of your body for 5 minutes daily -if you need further help with stress, anxiety or depression please follow up with your primary doctor or contact the wonderful folks at [facility]: [phone]  SOCIAL CONNECTIONS: -options in [place] if you wish to engage in more social and exercise related activities:  -Silver sneakers https://tools.silversneakers.com  -Walk with a Doc: http://www.duncan-williams.com/  -Check out the [facility] Active Adults 50+ section on the [place] [employer] (hiking clubs, book clubs, cards and games, chess, exercise classes, aquatic classes and much more) - see the website for details: https://www.[place].gov/departments/parks-recreation/active-adults50  -YouTube has lots of exercise videos for different ages and abilities as well  -[facility] (a variety of indoor and outdoor  inperson activities for adults). [phone]. [address].  -Virtual Online Classes (a variety of topics): see seniorplanet.org or call [phone]  -consider volunteering at a school, hospice center, church, senior center or elsewhere

## 2024-03-15 NOTE — Progress Notes (Signed)
 [facility] Summary Report / Loop Recorder

## 2024-03-15 NOTE — Addendum Note (Signed)
 Addended by: VICCI SELLER A on: 03/15/2024 02:48 PM   Modules accepted: Orders

## 2024-03-28 ENCOUNTER — Ambulatory Visit (INDEPENDENT_AMBULATORY_CARE_PROVIDER_SITE_OTHER)

## 2024-03-28 DIAGNOSIS — R55 Syncope and collapse: Secondary | ICD-10-CM

## 2024-03-28 LAB — CUP PACEART REMOTE DEVICE CHECK
Date Time Interrogation Session: 20250813231553
Implantable Pulse Generator Implant Date: 20220607

## 2024-03-29 ENCOUNTER — Telehealth: Payer: Self-pay

## 2024-03-29 NOTE — Telephone Encounter (Signed)
 Copied from CRM [id number]. Topic: Referral - Request for Referral >> Mar 29, 2024  9:35 AM Carlyon D wrote: Did the patient discuss referral with their provider in the last year? No but pt does not want to come in  (If No - schedule appointment) (If Yes - send message)  Appointment offered? Yes  Type of order/referral and detailed reason for visit: general surgery   Preference of office, provider, location: Dr. Krystal gerkin   If referral order, have you been seen by this specialty before? No (If Yes, this issue or another issue? When? Where?  Can we respond through MyChart? Yes

## 2024-03-29 NOTE — Telephone Encounter (Signed)
 Patient has been scheduled for 08/20 to discuss

## 2024-03-31 ENCOUNTER — Ambulatory Visit: Payer: Self-pay | Admitting: Internal Medicine

## 2024-04-02 DIAGNOSIS — Z8669 Personal history of other diseases of the nervous system and sense organs: Secondary | ICD-10-CM | POA: Diagnosis not present

## 2024-04-02 DIAGNOSIS — H401332 Pigmentary glaucoma, bilateral, moderate stage: Secondary | ICD-10-CM | POA: Diagnosis not present

## 2024-04-02 DIAGNOSIS — H2513 Age-related nuclear cataract, bilateral: Secondary | ICD-10-CM | POA: Diagnosis not present

## 2024-04-02 DIAGNOSIS — Z961 Presence of intraocular lens: Secondary | ICD-10-CM | POA: Diagnosis not present

## 2024-04-02 DIAGNOSIS — H02834 Dermatochalasis of left upper eyelid: Secondary | ICD-10-CM | POA: Diagnosis not present

## 2024-04-02 DIAGNOSIS — H02831 Dermatochalasis of right upper eyelid: Secondary | ICD-10-CM | POA: Diagnosis not present

## 2024-04-03 ENCOUNTER — Ambulatory Visit (INDEPENDENT_AMBULATORY_CARE_PROVIDER_SITE_OTHER): Admitting: Family Medicine

## 2024-04-03 ENCOUNTER — Encounter: Payer: Self-pay | Admitting: Family Medicine

## 2024-04-03 VITALS — BP 142/68 | HR 58 | Temp 97.9°F | Wt 224.3 lb

## 2024-04-03 DIAGNOSIS — R030 Elevated blood-pressure reading, without diagnosis of hypertension: Secondary | ICD-10-CM | POA: Diagnosis not present

## 2024-04-03 DIAGNOSIS — R1900 Intra-abdominal and pelvic swelling, mass and lump, unspecified site: Secondary | ICD-10-CM

## 2024-04-03 NOTE — Patient Instructions (Signed)
 Monitor blood pressure and be in touch if consistently > 140 systolic  I will be setting up CT scan to further evaluate mass.

## 2024-04-03 NOTE — Progress Notes (Unsigned)
 Established Patient Office Visit  Subjective   Patient ID: Joe Davis, male    DOB: [birth year]  Age: 78 y.o. MRN: [id number]  Chief Complaint  Patient presents with   Hernia    HPI  {History (Optional):23778} [name] is seen today with possible hernia right of umbilicus first noted about a month ago.  No pain.  Not increasing in size.  He has no history of abdominal surgery.  No umbilical swelling.  No recent change in bowel habits.  No prior history of hernia.  He has history of CAD, atrial fibrillation, BPH.  Medications reviewed.  He remains on Toprol -XL, rosuvastatin , Xarelto   Past Medical History:  Diagnosis Date   Arthritis    knees, hips (10/10/2014)   Basal cell carcinoma    BPH (benign prostatic hyperplasia)    Bradycardia vasovagal response 10 yrs ago   asymptomatic   Complication of anesthesia    bradycardia with spinal   Coronary artery disease    Diastolic dysfunction    Per echo in 2008   DVT (deep venous thrombosis) (HCC) 08/15/1977   RLE   Erectile dysfunction    History of gout    History of nuclear stress test    ETT-Myoview  3/17: Hypertensive blood pressure response to exercise, normal perfusion, EF 60%, low risk study   Hypercholesterolemia    ? statin intolerant. May tolerate Lipitor   Normal nuclear stress test 12/13/2009   Obesity    Pulmonary embolism (HCC) 08/15/1977   Syncope and collapse 8-10 yrs ago   Past Surgical History:  Procedure Laterality Date   ATRIAL FIBRILLATION ABLATION N/A 11/29/2022   Procedure: ATRIAL FIBRILLATION ABLATION;  Surgeon: Cindie Ole DASEN, MD;  [facility];  Service: Cardiovascular;  Laterality: N/A;   CARDIOVASCULAR STRESS TEST  12/13/2009   EF 73%; No ischemia   CARDIOVERSION N/A 09/05/2022   Procedure: CARDIOVERSION;  Surgeon: Sheena Pugh, DO;  Location: [facility];  Service: Cardiovascular;  Laterality: N/A;   COLONOSCOPY  08/16/2011   COLONOSCOPY WITH PROPOFOL  N/A 05/18/2022    Procedure: COLONOSCOPY WITH PROPOFOL ;  Surgeon: Burnette Fallow, MD;  Location: [facility];  Service: Gastroenterology;  Laterality: N/A;   IVC FILTER INSERTION N/A 10/06/2020   Procedure: IVC FILTER INSERTION;  Surgeon: Serene Gaile ORN, MD;  [facility];  Service: Cardiovascular;  Laterality: N/A;  w/ IVC Venogram   IVC FILTER REMOVAL N/A 01/05/2021   Procedure: IVC FILTER REMOVAL;  Surgeon: Serene Gaile ORN, MD;  [facility];  Service: Cardiovascular;  Laterality: N/A;   JOINT REPLACEMENT     KNEE ARTHROSCOPY Right 10/03/2012   Procedure: RIGHT KNEE ARTHROSCOPY WITH DEBRIDEMENT;  Surgeon: Dempsey LULLA Moan, MD;  Location: [facility];  Service: Orthopedics;  Laterality: Right;  WITH DEBRIDEMENT   LOOP RECORDER IMPLANT Left    MOHS SURGERY  6 yrs    nose   POLYPECTOMY  05/18/2022   Procedure: POLYPECTOMY;  Surgeon: Burnette Fallow, MD;  Location: [facility];  Service: Gastroenterology;;   SKIN CANCER EXCISION     left upper arm, biopsy   TOTAL HIP ARTHROPLASTY Right 10/06/2014    reports that he quit smoking about 46 years ago. His smoking use included cigarettes. He started smoking about 56 years ago. He has a 10 pack-year smoking history. He has never used smokeless tobacco. He reports current alcohol use of about 7.0 standard drinks of alcohol per week. He reports that he does not use drugs. He was  adopted. Family history is unknown by patient. Allergies  Allergen Reactions   Tape Other (See Comments)    Sensitivity: use paper tape    Review of Systems  Constitutional:  Negative for chills, fever and weight loss.  Gastrointestinal:  Negative for abdominal pain, blood in stool, constipation and diarrhea.      Objective:     BP (!) 142/68 (BP Location: Left Arm, Cuff Size: Normal)   Pulse (!) 58   Temp 97.9 F (36.6 C) (Oral)   Wt 224 lb 4.8 oz (101.7 kg)   SpO2 95%   BMI 31.73 kg/m  BP Readings from Last 3 Encounters:  04/03/24 (!)  142/68  12/04/23 (!) 142/66  08/15/23 130/60   Wt Readings from Last 3 Encounters:  04/03/24 224 lb 4.8 oz (101.7 kg)  12/04/23 229 lb 14.4 oz (104.3 kg)  08/15/23 230 lb (104.3 kg)      Physical Exam Vitals reviewed.  Constitutional:      General: He is not in acute distress.    Appearance: He is not ill-appearing.  Cardiovascular:     Rate and Rhythm: Normal rate.  Pulmonary:     Effort: Pulmonary effort is normal.     Breath sounds: Normal breath sounds.  Abdominal:     Comments: Abdomen nondistended.  Normal bowel sounds.  He has palpable mass which is about 8 cm right of umbilicus and just superior to umbilicus and this is somewhat oval-shaped and approximately 4 x 5 cm in dimension.  This is easily movable and nonfixed.  Nontender.  Neurological:     Mental Status: He is alert.      No results found for any visits on 04/03/24.  {Labs (Optional):23779}  The ASCVD Risk score (Arnett DK, et al., 2019) failed to calculate for the following reasons:   The 2019 ASCVD risk score is only valid for ages 55 to 52   Risk score cannot be calculated because patient has a medical history suggesting prior/existing ASCVD    Assessment & Plan:   #1 right sided abdominal mass.  Does not feel consistent with hernia.  Doubt lipoma.  Feels firmer to palpation than most lipomas Has benign features in terms of rounded edges and easily movable  -CT abdomen and pelvis to further assess  #2 hypertension.  Currently only takes low-dose Toprol -XL 25 mg daily.  Blood pressure slightly up today.  They have home cuff and we recommended consistent monitoring over the next few weeks and be in touch if consistent systolic readings over 140.  Also watch sodium intake.  Does recall eating pepperoni pizza yesterday which may be exacerbating somewhat  Wolm Scarlet, MD

## 2024-04-08 ENCOUNTER — Ambulatory Visit: Payer: Medicare Other

## 2024-04-11 ENCOUNTER — Ambulatory Visit
Admission: RE | Admit: 2024-04-11 | Discharge: 2024-04-11 | Disposition: A | Discharge status: Home / Self Care | Source: Ambulatory Visit | Attending: Family Medicine | Admitting: Family Medicine

## 2024-04-11 DIAGNOSIS — R1900 Intra-abdominal and pelvic swelling, mass and lump, unspecified site: Secondary | ICD-10-CM

## 2024-04-11 DIAGNOSIS — K4090 Unilateral inguinal hernia, without obstruction or gangrene, not specified as recurrent: Secondary | ICD-10-CM | POA: Diagnosis not present

## 2024-04-11 DIAGNOSIS — K5730 Diverticulosis of large intestine without perforation or abscess without bleeding: Secondary | ICD-10-CM | POA: Diagnosis not present

## 2024-04-11 MED ORDER — IOPAMIDOL (ISOVUE-300) INJECTION 61%
100.0000 mL | Freq: Once | INTRAVENOUS | Status: AC | PRN
  Administered 2024-04-11: 100 mL via INTRAVENOUS

## 2024-04-18 ENCOUNTER — Ambulatory Visit: Payer: Self-pay | Admitting: Family Medicine

## 2024-04-29 ENCOUNTER — Ambulatory Visit (INDEPENDENT_AMBULATORY_CARE_PROVIDER_SITE_OTHER)

## 2024-04-29 DIAGNOSIS — R55 Syncope and collapse: Secondary | ICD-10-CM | POA: Diagnosis not present

## 2024-04-29 LAB — CUP PACEART REMOTE DEVICE CHECK
Date Time Interrogation Session: 20250913231055
Implantable Pulse Generator Implant Date: 20220607

## 2024-05-03 ENCOUNTER — Ambulatory Visit: Payer: Self-pay | Admitting: Internal Medicine

## 2024-05-04 NOTE — Progress Notes (Signed)
 Remote Loop Recorder Transmission

## 2024-05-06 ENCOUNTER — Ambulatory Visit: Payer: Self-pay

## 2024-05-06 DIAGNOSIS — R21 Rash and other nonspecific skin eruption: Secondary | ICD-10-CM | POA: Diagnosis not present

## 2024-05-06 DIAGNOSIS — D485 Neoplasm of uncertain behavior of skin: Secondary | ICD-10-CM | POA: Diagnosis not present

## 2024-05-06 NOTE — Telephone Encounter (Signed)
 FYI Only or Action Required?: FYI only for provider.  Patient was last seen in primary care on 04/03/2024 by Micheal Wolm ORN, MD.  Called Nurse Triage reporting Rash.  Symptoms began about a month ago.  Interventions attempted: OTC medications: triple antibiotic ointment, benadryl last night.  Symptoms are: gradually worsening.  Triage Disposition: See Physician Within 24 Hours  Patient/caregiver understands and will follow disposition?: Yes   Copied from CRM #[id number]. Topic: Clinical - Red Word Triage >> May 06, 2024  9:34 AM [place] A wrote: [facility] that prompted transfer to Nurse Triage: Patient has rash all over that is painful and severe itching. Has had for week and half Reason for Disposition  SEVERE itching (i.e., interferes with sleep, normal activities or school)  Answer Assessment - Initial Assessment Questions Patient says he's been having 2 red spots for over a month. He says he was raking pin needles and noticed more red rashes that appeared all over. He says his wife gave him liquid benadryl last night, but hasn't taken anything for the itching. He has used a triple antibiotic ointment. Advised PCP doesn't have availability until October, offered with another provider, he agreed for tomorrow at 11 am with Dr. Theophilus.    1. APPEARANCE of RASH: What does the rash look like? (e.g., blisters, dry flaky skin, red spots, redness, sores)     Red bumps individual 2. SIZE: How big are the spots? (e.g., tip of pen, eraser, coin; inches, centimeters)     Vary in size 1/8-1/16 3. LOCATION: Where is the rash located?     Waist, spreading to legs, around neck, lower abdomen, back, arm pits 4. COLOR: What color is the rash? (Note: It is difficult to assess rash color in people with darker-colored skin. When this situation occurs, simply ask the caller to describe what they see.)     Red 5. ONSET: When did the rash begin?     1-1.5 weeks noticed getting worse; 1  month had 1-2 red spots 6. FEVER: Do you have a fever? If Yes, ask: What is your temperature, how was it measured, and when did it start?     No 7. ITCHING: Does the rash itch? If Yes, ask: How bad is the itch? (Scale 1-10; or mild, moderate, severe)     6-8 8. CAUSE: What do you think is causing the rash?     Unknown 9. MEDICINE FACTORS: Have you started any new medicines within the last 2 weeks? (e.g., antibiotics)      No 10. OTHER SYMPTOMS: Do you have any other symptoms? (e.g., dizziness, headache, sore throat, joint pain)       No  Protocols used: Rash or Redness - [facility]

## 2024-05-07 ENCOUNTER — Ambulatory Visit: Admitting: Internal Medicine

## 2024-05-08 NOTE — Progress Notes (Signed)
 Remote Loop Recorder Transmission

## 2024-05-13 ENCOUNTER — Ambulatory Visit: Payer: Medicare Other

## 2024-05-21 ENCOUNTER — Ambulatory Visit
Admission: RE | Admit: 2024-05-21 | Discharge: 2024-05-21 | Disposition: A | Source: Ambulatory Visit | Attending: Emergency Medicine | Admitting: Emergency Medicine

## 2024-05-21 DIAGNOSIS — R918 Other nonspecific abnormal finding of lung field: Secondary | ICD-10-CM | POA: Diagnosis not present

## 2024-05-21 DIAGNOSIS — R9389 Abnormal findings on diagnostic imaging of other specified body structures: Secondary | ICD-10-CM

## 2024-05-23 NOTE — Progress Notes (Signed)
 Remote Loop Recorder Transmission

## 2024-05-28 ENCOUNTER — Ambulatory Visit

## 2024-05-28 DIAGNOSIS — R55 Syncope and collapse: Secondary | ICD-10-CM | POA: Diagnosis not present

## 2024-05-28 LAB — CUP PACEART REMOTE DEVICE CHECK
Date Time Interrogation Session: 20251013230707
Implantable Pulse Generator Implant Date: 20220607

## 2024-05-29 NOTE — Progress Notes (Signed)
 Remote Loop Recorder Transmission

## 2024-05-30 ENCOUNTER — Ambulatory Visit: Payer: Self-pay | Admitting: Internal Medicine

## 2024-05-30 ENCOUNTER — Ambulatory Visit

## 2024-06-14 ENCOUNTER — Telehealth: Payer: Self-pay | Admitting: *Deleted

## 2024-06-14 NOTE — Telephone Encounter (Signed)
 Agree with in office assessment to determine what workup may be appropriate  Wolm LELON Scarlet MD [facility]

## 2024-06-14 NOTE — Telephone Encounter (Signed)
 Spoke with the patient's wife and informed her a visit is needed for evaluation.  She stated the patient is not sick, denies fever or chills and complains of a cold sensation in his legs, also no known injury.  Appt was scheduled on 11/4 and nurses visit on 11/5 was cancelled.

## 2024-06-14 NOTE — Telephone Encounter (Signed)
 Copied from CRM #[id number]. Topic: Clinical - Request for Lab/Test Order >> Jun 14, 2024 11:32 AM Harlene ORN wrote: Reason for CRM: Patient's wife called. Requesting new lab orders. Patient has been cold all the time, especially in his legs. Has been going on for 3-4 weeks now; not getting worse, but the symptoms are not getting better. Is scheduled for 11/05 for a flu shot. If any questions, please call the wife Elijah: [phone]

## 2024-06-17 ENCOUNTER — Ambulatory Visit: Payer: Medicare Other

## 2024-06-18 ENCOUNTER — Ambulatory Visit: Admitting: Family Medicine

## 2024-06-18 ENCOUNTER — Encounter: Payer: Self-pay | Admitting: Family Medicine

## 2024-06-18 VITALS — BP 140/66 | HR 52 | Temp 97.7°F | Wt 224.9 lb

## 2024-06-18 DIAGNOSIS — R6889 Other general symptoms and signs: Secondary | ICD-10-CM | POA: Diagnosis not present

## 2024-06-18 DIAGNOSIS — E7849 Other hyperlipidemia: Secondary | ICD-10-CM | POA: Diagnosis not present

## 2024-06-18 DIAGNOSIS — R5383 Other fatigue: Secondary | ICD-10-CM

## 2024-06-18 DIAGNOSIS — Z23 Encounter for immunization: Secondary | ICD-10-CM

## 2024-06-18 LAB — CBC WITH DIFFERENTIAL/PLATELET
Basophils Absolute: 0.1 K/uL (ref 0.0–0.1)
Basophils Relative: 1.1 % (ref 0.0–3.0)
Eosinophils Absolute: 0.5 K/uL (ref 0.0–0.7)
Eosinophils Relative: 7.7 % — ABNORMAL HIGH (ref 0.0–5.0)
HCT: 46 % (ref 39.0–52.0)
Hemoglobin: 15.3 g/dL (ref 13.0–17.0)
Lymphocytes Relative: 18.7 % (ref 12.0–46.0)
Lymphs Abs: 1.3 K/uL (ref 0.7–4.0)
MCHC: 33.4 g/dL (ref 30.0–36.0)
MCV: 89.7 fl (ref 78.0–100.0)
Monocytes Absolute: 0.8 K/uL (ref 0.1–1.0)
Monocytes Relative: 11.2 % (ref 3.0–12.0)
Neutro Abs: 4.2 K/uL (ref 1.4–7.7)
Neutrophils Relative %: 61.3 % (ref 43.0–77.0)
Platelets: 187 K/uL (ref 150.0–400.0)
RBC: 5.12 Mil/uL (ref 4.22–5.81)
RDW: 14.1 % (ref 11.5–15.5)
WBC: 6.8 K/uL (ref 4.0–10.5)

## 2024-06-18 LAB — LIPID PANEL
Cholesterol: 158 mg/dL (ref 0–200)
HDL: 50.5 mg/dL (ref >39.00)
LDL Cholesterol: 90 mg/dL (ref 0–99)
NonHDL: 107.65
Total CHOL/HDL Ratio: 3
Triglycerides: 89 mg/dL (ref 0.0–149.0)
VLDL: 17.8 mg/dL (ref 0.0–40.0)

## 2024-06-18 NOTE — Progress Notes (Signed)
 Established Patient Office Visit  Subjective   Patient ID: Joe Davis, male    DOB: [birth year]  Age: 78 y.o. MRN: [id number]  Chief Complaint  Patient presents with   Chills   Fatigue    HPI   Joe Davis has history of atrial fibrillation, past history of DVT, CAD, BPH, hyperlipidemia, gout.  He is seen today with complaints of cold sensation involving lower extremities bilaterally really for several months.  No claudication symptoms.  He does take Xarelto .  He had TSH about 7 months ago which was normal.  No difficulties of walking.  Denies any history of Raynaud's.  He states particularly his feet feel cold but sometimes this extends up toward the knee region.  Denies any associated pain or numbness.  His wife noted good distal pulses.  He is a non-smoker.  He has not noted any hand symptoms.  Also has some nonspecific fatigue.  Wife has noted that he frequently dozes off during his recliner.  He has apparently had a sleep study few years ago which showed mild abnormality but he does not use CPAP.  He is on rosuvastatin  10 mg daily but apparently taking very infrequently.  They would like to get follow-up lipid today.  He had coronary calcium  score of 2405 which 89th percentile for age/gender/race back in 2024.  Denies any recent chest pains.  Past Medical History:  Diagnosis Date   Arthritis    knees, hips (10/10/2014)   Basal cell carcinoma    BPH (benign prostatic hyperplasia)    Bradycardia vasovagal response 10 yrs ago   asymptomatic   Complication of anesthesia    bradycardia with spinal   Coronary artery disease    Diastolic dysfunction    Per echo in 2008   DVT (deep venous thrombosis) (HCC) 08/15/1977   RLE   Erectile dysfunction    History of gout    History of nuclear stress test    ETT-Myoview  3/17: Hypertensive blood pressure response to exercise, normal perfusion, EF 60%, low risk study   Hypercholesterolemia    ? statin intolerant. May tolerate Lipitor    Normal nuclear stress test 12/13/2009   Obesity    Pulmonary embolism (HCC) 08/15/1977   Syncope and collapse 8-10 yrs ago   Past Surgical History:  Procedure Laterality Date   ATRIAL FIBRILLATION ABLATION N/A 11/29/2022   Procedure: ATRIAL FIBRILLATION ABLATION;  Surgeon: Cindie Ole DASEN, MD;  [facility];  Service: Cardiovascular;  Laterality: N/A;   CARDIOVASCULAR STRESS TEST  12/13/2009   EF 73%; No ischemia   CARDIOVERSION N/A 09/05/2022   Procedure: CARDIOVERSION;  Surgeon: Sheena Pugh, DO;  [facility];  Service: Cardiovascular;  Laterality: N/A;   COLONOSCOPY  08/16/2011   COLONOSCOPY WITH PROPOFOL  N/A 05/18/2022   Procedure: COLONOSCOPY WITH PROPOFOL ;  Surgeon: Burnette Fallow, MD;  [facility]: [facility];  Service: Gastroenterology;  Laterality: N/A;   IVC FILTER INSERTION N/A 10/06/2020   Procedure: IVC FILTER INSERTION;  Surgeon: Serene Gaile ORN, MD;  [facility];  Service: Cardiovascular;  Laterality: N/A;  w/ IVC Venogram   IVC FILTER REMOVAL N/A 01/05/2021   Procedure: IVC FILTER REMOVAL;  Surgeon: Serene Gaile ORN, MD;  [facility];  Service: Cardiovascular;  Laterality: N/A;   JOINT REPLACEMENT     KNEE ARTHROSCOPY Right 10/03/2012   Procedure: RIGHT KNEE ARTHROSCOPY WITH DEBRIDEMENT;  Surgeon: Dempsey LULLA Moan, MD;  Location: [facility];  Service: Orthopedics;  Laterality:  Right;  WITH DEBRIDEMENT   LOOP RECORDER IMPLANT Left    MOHS SURGERY  6 yrs    nose   POLYPECTOMY  05/18/2022   Procedure: POLYPECTOMY;  Surgeon: Burnette Fallow, MD;  [facility]: [facility];  Service: Gastroenterology;;   SKIN CANCER EXCISION     left upper arm, biopsy   TOTAL HIP ARTHROPLASTY Right 10/06/2014    reports that he quit smoking about 46 years ago. His smoking use included cigarettes. He started smoking about 56 years ago. He has a 10 pack-year smoking history. He has never used smokeless tobacco. He reports current  alcohol use of about 7.0 standard drinks of alcohol per week. He reports that he does not use drugs. He was adopted. Family history is unknown by patient. Allergies  Allergen Reactions   Tape Other (See Comments)    Sensitivity: use paper tape    Review of Systems  Constitutional:  Positive for chills and malaise/fatigue. Negative for fever.  Respiratory:  Negative for cough and shortness of breath.   Cardiovascular:  Negative for chest pain.  Gastrointestinal:  Negative for abdominal pain and blood in stool.  Genitourinary:  Negative for dysuria.  Neurological:  Negative for focal weakness.      Objective:     BP (!) 140/66   Pulse (!) 52   Temp 97.7 F (36.5 C) (Oral)   Wt 224 lb 14.4 oz (102 kg)   SpO2 95%   BMI 31.81 kg/m  BP Readings from Last 3 Encounters:  06/18/24 (!) 140/66  04/03/24 (!) 142/68  12/04/23 (!) 142/66   Wt Readings from Last 3 Encounters:  06/18/24 224 lb 14.4 oz (102 kg)  04/03/24 224 lb 4.8 oz (101.7 kg)  12/04/23 229 lb 14.4 oz (104.3 kg)      Physical Exam Vitals reviewed.  Constitutional:      General: He is not in acute distress.    Appearance: He is not ill-appearing.  Cardiovascular:     Rate and Rhythm: Normal rate and regular rhythm.     Comments: Feet are just slightly cool to touch.  He has significant varicosities lower extremities.  2+ dorsalis pedis and posterior tibial pulses bilaterally.  Excellent capillary refill bilaterally.  Normal sensory function throughout with monofilament and to touch and vibration. Pulmonary:     Effort: Pulmonary effort is normal.     Breath sounds: Normal breath sounds. No wheezing or rales.  Musculoskeletal:     Left lower leg: No edema.  Neurological:     Mental Status: He is alert.      No results found for any visits on 06/18/24.    The ASCVD Risk score (Arnett DK, et al., 2019) failed to calculate for the following reasons:   The 2019 ASCVD risk score is only valid for ages 69 to  26   Risk score cannot be calculated because patient has a medical history suggesting prior/existing ASCVD    Assessment & Plan:   #1 subjective sensation of cold feet and lower legs.  Patient does take Xarelto .  No evidence to suggest significant arterial compromise.  He has no claudication symptoms.  Good distal pulses.  Previous thyroid  testing has been normal.  Will recheck TSH and free T4.  Check CBC.  Doubt worrisome in terms of current symptoms  #2 hyperlipidemia.  Inconsistent use of rosuvastatin .  Recheck fasting lipid today  #3 fatigue/daytime drowsiness.  He states he had previous sleep study which was somewhat equivocal.  Did discuss possibility  of repeat study for any progressive symptoms.  Will check labs above first.   No follow-ups on file.    Wolm Scarlet, MD

## 2024-06-19 ENCOUNTER — Ambulatory Visit: Payer: Self-pay | Admitting: Family Medicine

## 2024-06-19 ENCOUNTER — Ambulatory Visit

## 2024-06-19 LAB — T4, FREE: Free T4: 0.9 ng/dL (ref 0.60–1.60)

## 2024-06-19 LAB — TSH: TSH: 1.66 u[IU]/mL (ref 0.35–5.50)

## 2024-06-28 ENCOUNTER — Ambulatory Visit (INDEPENDENT_AMBULATORY_CARE_PROVIDER_SITE_OTHER)

## 2024-06-28 DIAGNOSIS — I4819 Other persistent atrial fibrillation: Secondary | ICD-10-CM | POA: Diagnosis not present

## 2024-06-30 ENCOUNTER — Ambulatory Visit: Payer: Self-pay | Admitting: Internal Medicine

## 2024-06-30 LAB — CUP PACEART REMOTE DEVICE CHECK
Date Time Interrogation Session: 20251113230432
Implantable Pulse Generator Implant Date: 20220607

## 2024-07-01 ENCOUNTER — Ambulatory Visit

## 2024-07-02 NOTE — Progress Notes (Signed)
 Remote Loop Recorder Transmission

## 2024-07-14 ENCOUNTER — Other Ambulatory Visit: Payer: Self-pay | Admitting: Pulmonary Disease

## 2024-07-17 DIAGNOSIS — H00024 Hordeolum internum left upper eyelid: Secondary | ICD-10-CM | POA: Diagnosis not present

## 2024-07-17 DIAGNOSIS — H0102B Squamous blepharitis left eye, upper and lower eyelids: Secondary | ICD-10-CM | POA: Diagnosis not present

## 2024-07-22 ENCOUNTER — Ambulatory Visit: Payer: Medicare Other

## 2024-07-29 ENCOUNTER — Ambulatory Visit

## 2024-07-29 DIAGNOSIS — I4819 Other persistent atrial fibrillation: Secondary | ICD-10-CM | POA: Diagnosis not present

## 2024-07-30 LAB — CUP PACEART REMOTE DEVICE CHECK
Date Time Interrogation Session: 20251214231111
Implantable Pulse Generator Implant Date: 20220607

## 2024-08-01 DIAGNOSIS — S81811A Laceration without foreign body, right lower leg, initial encounter: Secondary | ICD-10-CM | POA: Diagnosis not present

## 2024-08-01 DIAGNOSIS — L0889 Other specified local infections of the skin and subcutaneous tissue: Secondary | ICD-10-CM | POA: Diagnosis not present

## 2024-08-02 NOTE — Progress Notes (Signed)
 Remote Loop Recorder Transmission

## 2024-08-11 ENCOUNTER — Ambulatory Visit: Payer: Self-pay | Admitting: Internal Medicine

## 2024-08-29 ENCOUNTER — Ambulatory Visit

## 2024-08-29 DIAGNOSIS — I4819 Other persistent atrial fibrillation: Secondary | ICD-10-CM | POA: Diagnosis not present

## 2024-08-29 LAB — CUP PACEART REMOTE DEVICE CHECK
Date Time Interrogation Session: 20260114230817
Implantable Pulse Generator Implant Date: 20220607

## 2024-08-30 ENCOUNTER — Ambulatory Visit: Payer: Self-pay | Admitting: Cardiovascular Disease

## 2024-09-06 NOTE — Progress Notes (Signed)
 Remote Loop Recorder Transmission

## 2024-09-23 ENCOUNTER — Ambulatory Visit: Admitting: Family Medicine

## 2024-09-29 ENCOUNTER — Ambulatory Visit

## 2024-10-30 ENCOUNTER — Ambulatory Visit

## 2024-11-30 ENCOUNTER — Ambulatory Visit
